# Patient Record
Sex: Female | Born: 1940 | Race: Black or African American | Hispanic: No | Marital: Single | Smoking: Never smoker
Health system: Southern US, Community
[De-identification: ages and names within clinical notes are randomized; demographics above are authoritative.]

## PROBLEM LIST (undated history)

## (undated) DIAGNOSIS — F419 Anxiety disorder, unspecified: Secondary | ICD-10-CM

## (undated) DIAGNOSIS — I10 Essential (primary) hypertension: Secondary | ICD-10-CM

## (undated) DIAGNOSIS — F329 Major depressive disorder, single episode, unspecified: Secondary | ICD-10-CM

## (undated) DIAGNOSIS — M359 Systemic involvement of connective tissue, unspecified: Secondary | ICD-10-CM

## (undated) DIAGNOSIS — F32A Depression, unspecified: Secondary | ICD-10-CM

## (undated) DIAGNOSIS — M810 Age-related osteoporosis without current pathological fracture: Secondary | ICD-10-CM

## (undated) DIAGNOSIS — C801 Malignant (primary) neoplasm, unspecified: Secondary | ICD-10-CM

## (undated) DIAGNOSIS — K219 Gastro-esophageal reflux disease without esophagitis: Secondary | ICD-10-CM

## (undated) DIAGNOSIS — I209 Angina pectoris, unspecified: Secondary | ICD-10-CM

## (undated) DIAGNOSIS — M1990 Unspecified osteoarthritis, unspecified site: Secondary | ICD-10-CM

## (undated) DIAGNOSIS — A6000 Herpesviral infection of urogenital system, unspecified: Secondary | ICD-10-CM

## (undated) HISTORY — PX: JOINT REPLACEMENT: SHX530

## (undated) HISTORY — PX: ABDOMINAL HYSTERECTOMY: SHX81

## (undated) HISTORY — PX: COLONOSCOPY: SHX174

## (undated) HISTORY — PX: OOPHORECTOMY: SHX86

## (undated) HISTORY — PX: TUBAL LIGATION: SHX77

---

## 2006-08-23 ENCOUNTER — Emergency Department: Payer: Self-pay | Admitting: Emergency Medicine

## 2006-08-23 ENCOUNTER — Other Ambulatory Visit: Payer: Self-pay

## 2006-09-25 ENCOUNTER — Emergency Department: Payer: Self-pay | Admitting: Emergency Medicine

## 2007-05-16 ENCOUNTER — Emergency Department: Payer: Self-pay | Admitting: Emergency Medicine

## 2007-08-19 ENCOUNTER — Emergency Department: Payer: Self-pay

## 2007-08-19 ENCOUNTER — Other Ambulatory Visit: Payer: Self-pay

## 2009-01-01 ENCOUNTER — Emergency Department: Payer: Self-pay | Admitting: Emergency Medicine

## 2009-01-28 ENCOUNTER — Ambulatory Visit: Payer: Self-pay | Admitting: Internal Medicine

## 2009-02-27 ENCOUNTER — Ambulatory Visit: Payer: Self-pay | Admitting: Internal Medicine

## 2009-04-10 ENCOUNTER — Ambulatory Visit: Payer: Self-pay | Admitting: Orthopedic Surgery

## 2009-04-17 ENCOUNTER — Emergency Department: Payer: Self-pay | Admitting: Emergency Medicine

## 2009-07-17 ENCOUNTER — Ambulatory Visit: Payer: Self-pay | Admitting: Rheumatology

## 2009-08-06 ENCOUNTER — Ambulatory Visit: Payer: Self-pay | Admitting: Rheumatology

## 2010-02-08 ENCOUNTER — Emergency Department: Payer: Self-pay | Admitting: Emergency Medicine

## 2010-10-05 ENCOUNTER — Emergency Department: Payer: Self-pay | Admitting: Unknown Physician Specialty

## 2010-10-09 ENCOUNTER — Ambulatory Visit: Payer: Self-pay | Admitting: Unknown Physician Specialty

## 2010-11-25 ENCOUNTER — Ambulatory Visit: Payer: Self-pay | Admitting: Unknown Physician Specialty

## 2010-12-12 ENCOUNTER — Emergency Department: Payer: Self-pay | Admitting: Emergency Medicine

## 2011-01-06 ENCOUNTER — Ambulatory Visit: Payer: Self-pay | Admitting: Unknown Physician Specialty

## 2011-03-29 ENCOUNTER — Emergency Department: Payer: Self-pay | Admitting: Emergency Medicine

## 2011-11-24 ENCOUNTER — Emergency Department: Payer: Self-pay | Admitting: Unknown Physician Specialty

## 2012-02-19 ENCOUNTER — Ambulatory Visit: Payer: Self-pay | Admitting: Obstetrics and Gynecology

## 2012-02-19 LAB — URINALYSIS, COMPLETE
Bacteria: NONE SEEN
Bilirubin,UR: NEGATIVE
Blood: NEGATIVE
Glucose,UR: NEGATIVE mg/dL (ref 0–75)
Ph: 6 (ref 4.5–8.0)
Specific Gravity: 1.009 (ref 1.003–1.030)
Squamous Epithelial: NONE SEEN

## 2012-02-19 LAB — CBC
MCHC: 33.3 g/dL (ref 32.0–36.0)
RDW: 13.1 % (ref 11.5–14.5)
WBC: 6.2 10*3/uL (ref 3.6–11.0)

## 2012-02-25 ENCOUNTER — Ambulatory Visit: Payer: Self-pay | Admitting: Obstetrics and Gynecology

## 2012-02-26 LAB — HEMATOCRIT: HCT: 38.6 % (ref 35.0–47.0)

## 2012-03-02 LAB — PATHOLOGY REPORT

## 2012-06-19 ENCOUNTER — Emergency Department: Payer: Self-pay | Admitting: Emergency Medicine

## 2013-11-15 ENCOUNTER — Ambulatory Visit: Payer: Self-pay | Admitting: General Practice

## 2013-11-15 LAB — URINALYSIS, COMPLETE
Bacteria: NONE SEEN
Bilirubin,UR: NEGATIVE
Blood: NEGATIVE
Leukocyte Esterase: NEGATIVE
Protein: 30
RBC,UR: <1 /HPF (ref 0–5)
Specific Gravity: 1.017 (ref 1.003–1.030)
WBC UR: 1 /HPF (ref 0–5)

## 2013-11-15 LAB — CBC
HCT: 40.7 % (ref 35.0–47.0)
HGB: 13.7 g/dL (ref 12.0–16.0)
MCH: 32.3 pg (ref 26.0–34.0)
MCHC: 33.5 g/dL (ref 32.0–36.0)
Platelet: 144 10*3/uL — ABNORMAL LOW (ref 150–440)
WBC: 5.1 10*3/uL (ref 3.6–11.0)

## 2013-11-15 LAB — BASIC METABOLIC PANEL
BUN: 19 mg/dL — ABNORMAL HIGH (ref 7–18)
Creatinine: 0.9 mg/dL (ref 0.60–1.30)
EGFR (African American): >60
EGFR (Non-African Amer.): >60
Glucose: 93 mg/dL (ref 65–99)
Osmolality: 279 (ref 275–301)
Sodium: 139 mmol/L (ref 136–145)

## 2013-11-15 LAB — APTT: Activated PTT: 34 secs (ref 23.6–35.9)

## 2013-11-15 LAB — PROTIME-INR
INR: 0.9
Prothrombin Time: 12.2 secs (ref 11.5–14.7)

## 2013-11-16 LAB — URINE CULTURE

## 2013-11-27 ENCOUNTER — Inpatient Hospital Stay: Payer: Self-pay | Admitting: General Practice

## 2013-11-28 LAB — HEMOGLOBIN: HGB: 12.1 g/dL (ref 12.0–16.0)

## 2013-11-28 LAB — BASIC METABOLIC PANEL
Anion Gap: 4 — ABNORMAL LOW (ref 7–16)
BUN: 12 mg/dL (ref 7–18)
Calcium, Total: 8.6 mg/dL (ref 8.5–10.1)
Chloride: 104 mmol/L (ref 98–107)
EGFR (African American): >60
EGFR (Non-African Amer.): 58 — ABNORMAL LOW
Potassium: 3.5 mmol/L (ref 3.5–5.1)
Sodium: 136 mmol/L (ref 136–145)

## 2013-11-29 LAB — BASIC METABOLIC PANEL
Anion Gap: 3 — ABNORMAL LOW (ref 7–16)
BUN: 12 mg/dL (ref 7–18)
Calcium, Total: 8.6 mg/dL (ref 8.5–10.1)
Chloride: 106 mmol/L (ref 98–107)
EGFR (Non-African Amer.): 60 — ABNORMAL LOW
Glucose: 93 mg/dL (ref 65–99)
Osmolality: 273 (ref 275–301)
Potassium: 3.8 mmol/L (ref 3.5–5.1)
Sodium: 137 mmol/L (ref 136–145)

## 2014-01-09 ENCOUNTER — Ambulatory Visit: Payer: Self-pay | Admitting: General Practice

## 2014-01-09 LAB — URINALYSIS, COMPLETE
BACTERIA: NONE SEEN
BILIRUBIN, UR: NEGATIVE
Glucose,UR: NEGATIVE mg/dL (ref 0–75)
Ketone: NEGATIVE
Leukocyte Esterase: NEGATIVE
Nitrite: NEGATIVE
Ph: 5 (ref 4.5–8.0)
RBC,UR: <1 /HPF (ref 0–5)
SPECIFIC GRAVITY: 1.015 (ref 1.003–1.030)
Squamous Epithelial: NONE SEEN
WBC UR: <1 /HPF (ref 0–5)

## 2014-01-09 LAB — BASIC METABOLIC PANEL
Anion Gap: 4 — ABNORMAL LOW (ref 7–16)
BUN: 19 mg/dL — ABNORMAL HIGH (ref 7–18)
CALCIUM: 9.4 mg/dL (ref 8.5–10.1)
CHLORIDE: 105 mmol/L (ref 98–107)
CO2: 29 mmol/L (ref 21–32)
CREATININE: 1.02 mg/dL (ref 0.60–1.30)
EGFR (African American): >60
EGFR (Non-African Amer.): 55 — ABNORMAL LOW
GLUCOSE: 99 mg/dL (ref 65–99)
OSMOLALITY: 278 (ref 275–301)
Potassium: 4 mmol/L (ref 3.5–5.1)
SODIUM: 138 mmol/L (ref 136–145)

## 2014-01-09 LAB — CBC
HCT: 40.7 % (ref 35.0–47.0)
HGB: 13.5 g/dL (ref 12.0–16.0)
MCH: 32.8 pg (ref 26.0–34.0)
MCHC: 33.2 g/dL (ref 32.0–36.0)
MCV: 99 fL (ref 80–100)
Platelet: 171 10*3/uL (ref 150–440)
RBC: 4.12 10*6/uL (ref 3.80–5.20)
RDW: 13.2 % (ref 11.5–14.5)
WBC: 5.1 10*3/uL (ref 3.6–11.0)

## 2014-01-09 LAB — PROTIME-INR
INR: 0.8
PROTHROMBIN TIME: 11.4 s — AB (ref 11.5–14.7)

## 2014-01-09 LAB — MRSA PCR SCREENING

## 2014-01-09 LAB — APTT: Activated PTT: 32.1 secs (ref 23.6–35.9)

## 2014-01-10 LAB — URINE CULTURE

## 2014-01-24 ENCOUNTER — Inpatient Hospital Stay: Payer: Self-pay | Admitting: General Practice

## 2014-01-24 LAB — SEDIMENTATION RATE: Erythrocyte Sed Rate: 6 mm/hr (ref 0–30)

## 2014-01-25 LAB — BASIC METABOLIC PANEL
Anion Gap: 3 — ABNORMAL LOW (ref 7–16)
BUN: 16 mg/dL (ref 7–18)
Calcium, Total: 8.8 mg/dL (ref 8.5–10.1)
Chloride: 107 mmol/L (ref 98–107)
Co2: 27 mmol/L (ref 21–32)
Creatinine: 1.21 mg/dL (ref 0.60–1.30)
EGFR (African American): 52 — ABNORMAL LOW
GFR CALC NON AF AMER: 45 — AB
Glucose: 99 mg/dL (ref 65–99)
Osmolality: 275 (ref 275–301)
Potassium: 3.6 mmol/L (ref 3.5–5.1)
SODIUM: 137 mmol/L (ref 136–145)

## 2014-01-25 LAB — PLATELET COUNT: Platelet: 105 10*3/uL — ABNORMAL LOW (ref 150–440)

## 2014-01-25 LAB — HEMOGLOBIN: HGB: 11.5 g/dL — ABNORMAL LOW (ref 12.0–16.0)

## 2015-03-29 NOTE — Op Note (Signed)
PATIENT NAME:  Sherry Owens, Sherry Owens MR#:  [id number] DATE OF BIRTH:  Feb 22, 1941  DATE OF PROCEDURE:  11/27/2013  PREOPERATIVE DIAGNOSIS: Degenerative arthrosis of the left knee.   POSTOPERATIVE DIAGNOSIS: Degenerative arthrosis of the left knee.   PROCEDURE PERFORMED: Left total knee arthroplasty using computer-assisted navigation.   SURGEON: Laurice Record. Marry Guan, M.D.   ASSISTANT: April Berndt, NP (required to maintain retraction throughout the procedure).   ANESTHESIA: Spinal.   ESTIMATED BLOOD LOSS: 100 mL.   FLUIDS REPLACED: 1600 mL of crystalloid.   TOURNIQUET TIME: 99 minutes.   DRAINS: Two medium drains to reinfusion system.   SOFT TISSUE RELEASES: Anterior cruciate ligament, posterior cruciate ligament, deep medial collateral ligament, and patellofemoral ligament.   IMPLANTS UTILIZED: DePuy PFC Sigma size 3 posterior stabilized femoral component (cemented), size 2.5 MBT tibial component (cemented), 35 mm three-peg oval dome patella (cemented), and a 10 mm stabilized rotating platform polyethylene insert.   INDICATIONS FOR SURGERY: The patient is a 74 year old female who has been seen for complaints of progressive left knee pain. X-rays demonstrated severe degenerative changes  of the relative varus deformity. After discussion of the risks and benefits of surgical intervention, the patient expressed understanding of the risks, benefits, and agreed with plans for surgical intervention.   PROCEDURE IN DETAIL: The patient was brought to the operating room and, after adequate spinal anesthesia was achieved, a tourniquet was placed on the patient's upper left thigh. The patient's left knee and leg were cleaned and prepped with alcohol and DuraPrep and draped in the usual sterile fashion. A "timeout" was performed as per usual protocol. The left lower extremity was exsanguinated using an Esmarch, and the tourniquet was inflated to 300 mmHg.   An anterior longitudinal incision was made followed  by a standard mid vastus approach. A large effusion was evacuated. The deep fibers of the medial collateral ligament were elevated in a subperiosteal fashion off the medial flare of the tibia so as to maintain a continuous soft-tissue sleeve. The patella was subluxed laterally and the patellofemoral ligament was incised. Inspection of the knee demonstrated severe degenerative changes in a tricompartmental fashion with notable changes to the patellofemoral articulation. Prominent osteophytes were debrided using a rongeur. Anterior and posterior cruciate ligaments were excised.   Two 4.0 mm Schanz pins were inserted into the femur and into the tibia for attachment of the array of trackers used for computer-assisted navigation. Hip center was identified using circumduction technique. Distal landmarks were mapped using the computer. The distal femur and proximal tibia were mapped using the computer. Distal femoral cutting guide was positioned using computer-assisted navigation so as to achieve 5-degree distal valgus cut. Cut was performed and verified using the computer.   Distal femur was sized and it was felt that a size 3 femoral component was appropriate. A size 3 cutting guide was positioned and anterior cut was performed and verified using the computer. This was followed by completion of the posterior and chamfer cuts. Femoral cutting guide for central box was then positioned and the central box cut was performed.  Attention was then directed to the proximal tibia. Medial and lateral menisci were excised. The extramedullary tibial cutting guide was positioned using computer-assisted navigation so as to achieve 0-degree varus-valgus alignment and 0-degree posterior slope. Cut was performed and verified using the computer. The proximal tibia was sized and it was felt that a size 2.5 tibial tray was appropriate. Tibial and femoral trials were inserted. Posterior osteophytes were debrided off of the femoral  condyle using curved osteotomes. A 10 mm polyethylene trial was inserted and the knee was placed through a range of motion. Excellent mediolateral soft-tissue balancing was appreciated in full extension and in flexion.   Finally, the patella was cut and prepared so as to accommodate a 35 mm three-peg oval dome patella. Patellar trial was placed and he was placed through range of motion with excellent patellar tracking appreciated. The femoral trial was removed. Central post hole for the tibial component was reamed followed by insertion of a keel punch. Tibial trials were then removed.   The cut surfaces of bone were irrigated with copious amounts of normal saline with antibiotic solution using pulsatile lavage and then suctioned dry. Polymethyl methacrylate cement was prepared in the usual fashion using a vacuum mixer. Cement was applied to the cut surface of the proximal tibia as well as along the undersurface of a size 2.5 MBT tibial component. The tibial component was positioned and impacted into place. Excess cement was removed using [employer] fast streamer. Cement was then applied to the cut surfaces of the femur as well as on the posterior flanges of  size 3 posterior stabilized femoral component. Femoral component was positioned and impacted into place. Excess cement was removed using [employer] fast streamer. A 10 mm stabilized polyethylene trial was inserted and the knee was brought in full extension with steady axial compression applied. Finally, cement was applied to the backside of a 35 mm three-peg oval dome patella, and the patellar component was positioned and patellar clamp applied. Excess cement was removed using [employer] fast streamer.   After adequate curing of the cement, tourniquet was deflated after a total tourniquet time of 99 minutes. Hemostasis was achieved using electrocautery. The knee was irrigated with copious amounts of normal saline with antibiotic solution using pulsatile lavage and then suctioned  dry. The knee was inspected for any residual cement debris. Then 20 mL of 1.3% Exparel in 40 mL of normal saline was injected along the posterior capsule, medial and lateral gutters, and along the arthrotomy site. A 10 mm stabilized rotating platform polyethylene insert was inserted and the knee was placed through a range of motion. Excellent patellar tracking was appreciated and excellent mediolateral soft-tissue balancing was appreciated both in flexion and in extension.   Two medium drains were placed in the wound bed and brought out through a separate stab incision to be attached to a reinfusion system. The medial parapatellar portion of the incision was reapproximated using interrupted sutures of #1 Vicryl. The subcutaneous tissue was injected with a total of 30 mL of 0.25% Marcaine with epinephrine. The subcutaneous tissue was approximated in layers using first #0 Vicryl followed by 2-0 Vicryl. The skin was closed with skin staples. A sterile dressing was applied. The patient tolerated the procedure well. She was transported to the recovery room in stable condition.    ____________________________ Laurice Record. Holley Bouche., MD jph:np D: 11/27/2013 15:51:31 ET T: 11/27/2013 22:31:55 ET JOB#: [id number]  cc: Jeneen Rinks P. Holley Bouche., MD, <Dictator> JAMES P Holley Bouche MD ELECTRONICALLY SIGNED 11/29/2013 7:26

## 2015-03-30 NOTE — Discharge Summary (Signed)
PATIENT NAME:  Sherry Owens, Sherry Owens MR#:  [id number] DATE OF BIRTH:  January 23, 1941  DATE OF ADMISSION:  11/27/2013 DATE OF DISCHARGE:  11/30/2013  ADMITTING DIAGNOSIS: Degenerative arthrosis of the left knee.   DISCHARGE DIAGNOSIS: Degenerative arthrosis of the left knee.  OPERATION: On 11/27/2013, the patient had a left total knee arthroplasty using computer-aided navigation.   SURGEON: Skip Estimable, M.D.   ASSISTANT: Francena Hanly, NP.   ANESTHESIA: Spinal.   ESTIMATED BLOOD LOSS: 100 mL.  TOURNIQUET TIME: 99 minutes.   DRAINS: Two medium drains to reinfusion system.   IMPLANTS USED: DePuy PFC Sigma size 3 posterior stabilized femoral component that was cemented, size 2.5. MBT tibial component (cemented), 35 mm 3-peg oval dome patella that was cemented and a 10 mm stabilized rotating platform polyethylene insert. The patient was stabilized after surgery, brought to the recovery room, and then brought down to the orthopedic floor for pain control and physical therapy.   HISTORY: The patient is a 74 year old female, who presented for upcoming left total knee replacement. The patient has been refractory to conservative treatment. The patient has had activities of daily living that have been more difficulty. The patient cannot tolerate anti-inflammatories and has had cortisone injections with no relief. The patient was set up for surgery.   PHYSICAL EXAMINATION:  GENERAL: Alert female with antalgic gait and a varus thrust to the left knee.  LUNGS: Clear to auscultation.  CARDIOVASCULAR: Regular rate and rhythm.  MUSCULOSKELETAL: In regard to the left knee, the patient has no effusion. The patient has medial joint line tenderness. The patient has full extension to 128 degrees of flexion.   HOSPITAL COURSE: After initial admission on November 27, 2013, the patient was brought to the orthopedic floor. On postoperative day 1, the patient had a hemoglobin of 12.1, which dropped down to around 10.9. On  postoperative day 2, the patient did physical therapy, ambulating 125 feet on postop day 1 and did very well. The patient was ready to go home on November 30, 2013.   CONDITION AT DISCHARGE: Stable.   DISPOSITION: The patient was sent home with home health physical therapy.   DISCHARGE INSTRUCTIONS: The patient will follow up with [facility] orthopedics on December 12, 2013. The patient will do weight bear as tolerated. The patient will elevate her leg with 1 to 2 pillows and use thigh-high TED hose on both legs, removed at bedtime. The patient will elevate her heels off the bed and be encouraged to do cough and deep breathing. Diet is regular. The patient will use Polar Care to decrease swelling and try to keep the dressing clean and dry, try not to get it wet. The patient will have a dressing change as needed with physical therapy. The patient will call the clinic if there is any bright red bleeding, calf pain, bowel or bladder difficulty, or fever greater than 101.5. The patient will do home health physical therapy per protocol.   DISCHARGE MEDICATIONS: The patient will use her home medication and then she will also take oxycodone 5 mg 1 tablet q.4 hours as needed for pain, Tramadol 50 mg 1 tablet every 4 hours as needed for less severe pain, Celebrex 1 capsule b.i.d. for 30 days and Lovenox 40 mg subcutaneous once a day for 14 days, then discontinue and begin an enteric-coated aspirin 81 mg once a day.  ____________________________ Lenna Sciara. Reche Dixon, [place] jtm:aw D: 11/29/2013 06:42:00 ET T: 11/29/2013 06:51:50 ET JOB#: [id number]  cc: J. Reche Dixon, [place], <Dictator> J  Lillyanne Bradburn PA ELECTRONICALLY SIGNED 12/09/2013 6:23

## 2015-03-30 NOTE — Op Note (Signed)
PATIENT NAME:  Sherry Owens, Sherry Owens MR#:  [id number] DATE OF BIRTH:  [birth year]  DATE OF PROCEDURE:  01/24/2014  PREOPERATIVE DIAGNOSIS: Degenerative arthrosis of the right knee.   POSTOPERATIVE DIAGNOSIS: Degenerative arthrosis of the right knee.   PROCEDURE PERFORMED: Right total knee arthroplasty using computer-assisted navigation.   SURGEON: Laurice Record. Holley Bouche., MD  ASSISTANT: Vance Peper, PA (required to maintain retraction throughout the procedure).   ANESTHESIA: Spinal.   ESTIMATED BLOOD LOSS: 100 mL.   FLUIDS REPLACED: 2000 mL of crystalloid.   TOURNIQUET TIME: 70 minutes.   DRAINS: Two medium drains to reinfusion system.   SOFT TISSUE RELEASES: Anterior cruciate ligament, posterior cruciate ligament, deep medial collateral ligament and patellofemoral ligament.   IMPLANTS UTILIZED: DePuy PFC Sigma size 3 posterior stabilized femoral component (cemented), size 2.5 MBT tibial component (cemented), 35 mm 3 peg oval dome patella (cemented), and a 10 mm stabilized rotating platform polyethylene insert.   INDICATIONS FOR SURGERY: The patient is a 74 year old female who has been seen for complaints of progressive right knee pain. X-rays demonstrated significant degenerative changes in a tricompartmental fashion. The patient previously had undergone left total knee arthroplasty approximately 2 months ago with excellent results. Risks and benefits of surgical intervention were discussed with the patient. The patient expressed understanding of the risks and benefits and agreed with plans for surgical intervention.   PROCEDURE IN DETAIL: The patient was brought into the operating room and, after adequate spinal anesthesia was achieved, a tourniquet was placed on the patient's upper right thigh. The patient's right knee and leg were cleaned and prepped with alcohol and DuraPrep and draped in the usual sterile fashion. A "timeout" was performed as per usual protocol. The right lower extremity was  exsanguinated using an Esmarch, and tourniquet was inflated to 300 mmHg. An anterior longitudinal incision was made, followed by a standard mid vastus approach. A moderate effusion was evacuated. The deep fibers of the medial collateral ligament were elevated in a subperiosteal fashion off the medial flare of the tibia so as to maintain a continuous soft tissue sleeve. The patella was subluxed laterally, and the patellofemoral ligament was incised. Inspection of the knee demonstrated severe degenerative changes most notably to the medial and patellofemoral compartments. Osteophytes were debrided using a rongeur. Anterior and posterior cruciate ligaments were excised. Two 4.0 mm Schanz pins were inserted into the femur and into the tibia for attachment of the array of trackers used for computer-assisted navigation. Hip center was identified using circumduction technique. Distal landmarks were mapped using the computer. The distal femur and proximal tibia were mapped using the computer. The distal femoral cutting guide was positioned using computer-assisted navigation so as to achieve a 5 degree distal valgus cut. Cut was performed and verified using the computer. Distal femur was sized, and it was felt that a size 3 femoral component was appropriate. A size 3 cutting guide was positioned, and the anterior cut was performed and verified using the computer. This was followed by completion of the posterior and chamfer cuts. Femoral cutting guide for a central box was then positioned, and the central box cut was performed. Attention was then directed to the proximal tibia. Medial and lateral menisci were excised. The extramedullary tibial cutting guide was positioned using computer-assisted navigation so as to achieve a 0 degree varus and valgus alignment and a 0 degree posterior slope. Cut was performed and verified using the computer. The proximal tibia was sized, and it was felt that a size 2.5 tibial tray  was  appropriate. Tibial and femoral trials were inserted, followed by insertion of a 10 mm polyethylene insert. Excellent medial and lateral soft tissue balance was appreciated both in full extension and in flexion. Finally, the patella was cut and prepared so as to accommodate a 35 mm 3 peg oval dome patella. Patellar trial was placed, and the knee was placed through range of motion, with excellent patellar tracking appreciated.   Femoral trial was removed. Central post hole for the tibial component was reamed, followed by insertion of a keel punch. Tibial trials were then removed. The cut surfaces of bone were irrigated with copious amounts of normal saline with antibiotic solution using pulsatile lavage and then suctioned dry. Polymethyl methacrylate cement was prepared in the usual fashion using a vacuum mixer. Cement was applied to the cut surface of the proximal tibia as well as along the undersurface of a size 2.5 MBT tibial component. Tibial component was positioned and impacted into place. Excess cement was removed using [employer] fast streamer. Cement was then applied to the cut surface of the femur as well as along the posterior flanges of a size 3 posterior stabilized femoral component. Femoral component was positioned and impacted into place. Excess cement was removed using [employer] fast streamer. A 10 mm polyethylene trial was inserted, and the knee was brought into full extension with steady axial compression applied. Finally, cement was applied to the backside of a 35 mm 3 peg oval dome patella, and the patellar component was positioned and patellar clamp applied. Excess cement was removed using [employer] fast streamer.   After adequate curing of cement, tourniquet was deflated after a total tourniquet time of 70 minutes. Hemostasis was achieved using electrocautery. The knee was irrigated with copious amounts of normal saline with antibiotic solution using pulsatile lavage and then suctioned dry. The knee was inspected  for any residual cement debris. Then, 20 mL of 1.3% Exparel in 40 mL of normal saline was injected along the posterior capsule, medial and lateral gutters and along the arthrotomy site. A 10 mm stabilized rotating platform polyethylene insert was inserted, and the knee was placed through range of motion with excellent patellar tracking appreciated and excellent medial and lateral soft tissue balancing noted both in full extension and in flexion. Two medium drains were placed in the wound bed and brought out through a separate stab incision to be attached to a reinfusion system. The medial parapatellar portion of the incision was reapproximated using interrupted sutures of #1 Vicryl. The subcutaneous tissue was approximated in layers using first #0 Vicryl, followed by 2-0 Vicryl. Skin was closed with skin staples after injecting of 30 mL of 0.25% Marcaine with epinephrine in the subcutaneous tissue along the incision line. A sterile dressing was applied.   The patient tolerated the procedure well. She was transported to the recovery room in stable condition.   ____________________________ Laurice Record. Holley Bouche., MD jph:lb D: 01/24/2014 10:21:38 ET T: 01/24/2014 11:16:53 ET JOB#: [id number]  cc: Jeneen Rinks P. Holley Bouche., MD, <Dictator> Laurice Record Holley Bouche MD ELECTRONICALLY SIGNED 01/26/2014 6:44

## 2015-03-30 NOTE — Discharge Summary (Signed)
PATIENT NAME:  Sherry Owens, Sherry Owens MR#:  [id number] DATE OF BIRTH:  [birth year]  DATE OF ADMISSION:  01/24/2014 DATE OF DISCHARGE:  01/27/2014  DICTATING FOR: Skip Estimable, MD.  ADMITTING DIAGNOSIS: Degenerative arthrosis of the right knee.   DISCHARGE DIAGNOSIS: Degenerative arthrosis of the right knee.   HISTORY OF PRESENT ILLNESS:  The patient is a pleasant 74 year old female who has been followed at [facility] for progression of bilateral knees. She had undergone a left total knee arthroplasty approximately 6 weeks prior to this admission for right knee pain. She had localized most of the pain along the medial aspect of the knee. Her pain was aggravated with weight-bearing activities. She had denied any gross locking or giving way of the right knee. The right knee pain had begun to increase her limitation with her activities of daily living.   She states that the pain has increased to the point where it is significantly interfering with her activities of daily living.   X-rays of the right knee taken in [facility] showed narrowing of the medial cartilage space with associated varus alignment. She was noted to have osteophyte as well as subchondral sclerosis. After discussion of the risks and benefits of surgical intervention, the patient expressed her understanding of the risks and benefits and agreed for plans for surgical intervention.   HOSPITAL COURSE:   PROCEDURE: Right total knee arthroplasty using computer-assisted navigation.   ANESTHESIA: Spinal.   SOFT TISSUE RELEASE: Anterior cruciate ligament, posterior cruciate ligament, deep medial collateral ligament, as well as the patellofemoral ligament.   IMPLANTS UTILIZED: DePuy PFC Sigma size 3 posterior stabilized femoral component (cemented), size 2.5 MBT tibial component (cemented), 35 mm three-pegged oval dome patella (cemented), and a 10 mm stabilized rotating platform polyethylene insert.   HOSPITAL COURSE: The patient  tolerated the procedure very well. She had no complications. She was then taken to the PACU where she was stabilized and then transferred to the orthopedic floor. The patient began receiving anticoagulation therapy of Lovenox 30 mg subcu q.12 hours per anesthesia pharmacy protocol. She was fitted with TED stockings bilaterally. These were  allowed to be removed 1 hour per 8-hour shift. The right leg stocking was applied on day 2 following removal of the Hemovac and dressing change.   The patient was also fitted with A-VI compression foot pumps bilaterally set at 80 mmHg. Her calves have been nontender. There has been no evidence of any DVTs. Negative Homans sign. Heels were elevated off the bed using rolled towels.   The patient has denied any chest pain or shortness of breath. Vital signs have been stable. She has afebrile. Hemodynamically she is stable. No transfusions were given other than the Autovac transfusions given the first 6 hours postoperatively.   Physical therapy was initiated on day 1 for gait training and transfers. She was initially extremely slow about ambulating. She was noted to have excellent range of motion. She just liked to stay in bed where it was warm. After discussion of possibly going to rehab, she did begin ambulating much better and upon being discharged was ambulating greater than 200 feet. She was able go up 4 steps. Was independent with bed-to-chair transfers. The patient also began receiving occupational therapy on day 1 for ADLs and assistive devices.   The patient's IV, Foley and Hemovac were discontinued on day 2 along with a dressing change. The wound free of any drainage or signs of infection. Polar Care was reapplied to the surgical leg, maintaining a  temperature of 40 to 50 degrees Fahrenheit.   DISPOSITION: The patient was discharged to home in improved stable condition.   DISCHARGE INSTRUCTIONS:  1.  She may weight bear as tolerated. Continue using a walker  until cleared by physical therapy to go to a quad cane. She will receive home health PT. She is to continue with TED stockings bilaterally. These may be removed at night but are to be worn during the day. Also continue with Polar Care, maintaining a temperature of 40 to 50 degrees Fahrenheit. Elevate the right lower extremity on pillows.  2.  Recommend that she continue with incentive spirometer q.1 hour while awake. Encourage cough and deep breathing q.2 hours while awake.  3.  She is placed on a regular diet.  4.  She was instructed on wound care. She is not to get the wound wet until the staples are removed in 2 weeks.  5.  She has a follow-up appointment with [facility] on March 5 at 8:45. She is to call the clinic sooner if any temperatures of 101.5 or greater or excessive bleeding.   DRUG ALLERGIES: CYMBALTA.   MEDICATIONS: The patient is to resume her regular medication that she was on prior to admission. She was given a prescription for oxycodone 5 to 10 mg q.4-6 hours p.r.n. for pain, Ultram 50 to 100 mg q.4-6 hours p.r.n. for pain, Lovenox 40 mg subcu daily for 14 days then discontinue and begin taking one 81 mg enteric-coated aspirin unless contraindicated.   PAST MEDICAL HISTORY:  1.  Cataracts.  2.  Depression.  3.  Coronary artery disease.  4.  Angina.  5.  Congestive heart failure.  6.  Gastroesophageal reflux disease with gastritis.  7.  Hyperlipidemia.  8.  Migraine headaches.  9.  Vitamin D deficiency.  10.  Hypertension.  11.  Hyperplastic colon polyps.   ____________________________ Vance Peper, PA [id number] D: 01/29/2014 18:10:53 ET T: 01/29/2014 22:39:35 ET JOB#: [id number]  cc: Vance Peper, PA, <Dictator> JON WOLFE PA ELECTRONICALLY SIGNED 01/30/2014 8:01

## 2015-03-31 NOTE — Op Note (Signed)
PATIENT NAME:  Sherry Owens, Sherry Owens MR#:  [id number] DATE OF BIRTH:  February 13, 1941  DATE OF PROCEDURE:  02/25/2012  PREOPERATIVE DIAGNOSIS:  Ovarian cyst and postmenopausal bleeding.   POSTOPERATIVE DIAGNOSIS: Ovarian cyst and postmenopausal bleeding.   PROCEDURE:  Complete laparoscopic hysterectomy, bilateral salpingo-oophorectomy.   SURGEON: Donzetta Matters, M.D.   ASSISTANT: Malachy Mood, M.D.   ESTIMATED BLOOD LOSS: 50 mL. OPERATIVE FLUIDS: 1600 mL.  COMPLICATIONS: None.  FINDINGS: Soft, boggy uterus. Normal-appearing tubes and left ovary. Cyst on the right ovary. SPECIMEN: Uterus with cervix, right and left tubes and ovaries.   INDICATIONS: The patient is a 74 year old who presents with a long-standing history of right ovarian cyst. The cyst had not resolved over the last year. She was also evaluated for postmenopausal bleeding. The patient desired definitive surgical management. Risks, benefits, indications, and alternatives of the procedure were explained and informed consent was obtained.   PROCEDURE: The patient was taken to the Operating Room with IV fluids running. She was prepped and draped in the usual sterile fashion in [place]. A speculum was placed in the vagina. The anterior lip of the cervix was grasped with a single-tooth tenaculum. The uterus sounded. A V-Care uterine manipulator was placed. Attention was turned to her abdomen where the infraumbilical region was injected with 0.5% Sensorcaine. A Veress needle was placed. The abdomen was insufflated with CO2 gas. The Veress needle was removed and the abdomen was entered under direct visualization using a #11 mm Xcel trocar. Two additional trocars were placed, an 11 mm in the patient's left and a 5 mm on the patient's right. The left uterine cornu was grasped with an atraumatic grasper. The right infundibulopelvic ligament was cauterized with Kleppinger and transected using the harmonic scalpel. The round ligament was  cauterized and transected using the harmonic scalpel. The bladder flap was created. The remainder of the broad ligament and the cardinal ligament containing the uterine artery were also cauterized with Kleppinger and transected using the harmonic scalpel. This was repeated on the patient's right side. The uterus was amputated as the colpotomy was made. The uterus and both tubes and ovaries were removed through the patient's vagina. The right tube and ovary containing the ovarian cyst were amputated at the utero-ovarian ligament and sent to pathology for frozen section. 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 A lubricant embedded sponge was placed inside of the vagina to maintain pneumoperitoneum. The vaginal cuff was closed using EndoStitch closure device. All areas were examined and found to be securely closed. All surgical areas were examined and were found to be hemostatic. The procedure was ended at this point. All trocars were removed from the patient's abdomen. The fascia of the umbilical incision was closed with #0 Vicryl and the skin of all incisions were closed with 4-0 Vicryl. The patient tolerated the procedure well. Sponge, needle and instrument counts were correct x2. The patient was awakened from anesthesia and taken to the recovery room in stable condition.   ____________________________ Rolm Gala Ferne Reus, MD law:ap D: 02/25/2012 11:03:37 ET T: 02/25/2012 11:28:57 ET JOB#: [id number]  cc: Sherlynn Carbon A. Ferne Reus, MD, <Dictator> Renda Rolls LEE MD ELECTRONICALLY SIGNED 02/25/2012 11:57

## 2016-02-24 ENCOUNTER — Emergency Department
Admission: EM | Admit: 2016-02-24 | Discharge: 2016-02-25 | Disposition: A | Discharge status: Home / Self Care | Payer: Medicare Other | Attending: Emergency Medicine | Admitting: Emergency Medicine

## 2016-02-24 ENCOUNTER — Encounter: Payer: Self-pay | Admitting: *Deleted

## 2016-02-24 DIAGNOSIS — M62830 Muscle spasm of back: Secondary | ICD-10-CM | POA: Diagnosis not present

## 2016-02-24 DIAGNOSIS — I10 Essential (primary) hypertension: Secondary | ICD-10-CM | POA: Insufficient documentation

## 2016-02-24 DIAGNOSIS — M545 Low back pain, unspecified: Secondary | ICD-10-CM

## 2016-02-24 DIAGNOSIS — R42 Dizziness and giddiness: Secondary | ICD-10-CM | POA: Diagnosis not present

## 2016-02-24 DIAGNOSIS — M62838 Other muscle spasm: Secondary | ICD-10-CM

## 2016-02-24 HISTORY — DX: Essential (primary) hypertension: I10

## 2016-02-24 HISTORY — DX: Anxiety disorder, unspecified: F41.9

## 2016-02-24 HISTORY — DX: Unspecified osteoarthritis, unspecified site: M19.90

## 2016-02-24 NOTE — ED Notes (Addendum)
Pt to ED via EMS with generalized muscle cramps and body pain. Pt states been working out in yard all day, began to have muscle cramps and joint pain. Pt states "i get locked up a lot like this" Pt with hx of severe anxiety. Upon arrival, pt states primarily back pain 10/10. Pt hypertensive at 218/92, states been out of BP meds x 3 days. Other vitals wnl at this time.

## 2016-02-25 ENCOUNTER — Encounter: Payer: Self-pay | Admitting: Emergency Medicine

## 2016-02-25 DIAGNOSIS — M545 Low back pain: Secondary | ICD-10-CM | POA: Diagnosis not present

## 2016-02-25 LAB — COMPREHENSIVE METABOLIC PANEL
ALBUMIN: 4.1 g/dL (ref 3.5–5.0)
ALK PHOS: 121 U/L (ref 38–126)
ALT: 19 U/L (ref 14–54)
ANION GAP: 5 (ref 5–15)
AST: 28 U/L (ref 15–41)
BILIRUBIN TOTAL: 0.8 mg/dL (ref 0.3–1.2)
BUN: 26 mg/dL — ABNORMAL HIGH (ref 6–20)
CALCIUM: 8.9 mg/dL (ref 8.9–10.3)
CO2: 25 mmol/L (ref 22–32)
CREATININE: 1.13 mg/dL — AB (ref 0.44–1.00)
Chloride: 102 mmol/L (ref 101–111)
GFR calc non Af Amer: 47 mL/min — ABNORMAL LOW (ref >60)
GFR, EST AFRICAN AMERICAN: 54 mL/min — AB (ref >60)
GLUCOSE: 94 mg/dL (ref 65–99)
Potassium: 3.2 mmol/L — ABNORMAL LOW (ref 3.5–5.1)
Sodium: 132 mmol/L — ABNORMAL LOW (ref 135–145)
TOTAL PROTEIN: 7.6 g/dL (ref 6.5–8.1)

## 2016-02-25 LAB — CBC
HEMATOCRIT: 41.4 % (ref 35.0–47.0)
HEMOGLOBIN: 13.8 g/dL (ref 12.0–16.0)
MCH: 31.8 pg (ref 26.0–34.0)
MCHC: 33.3 g/dL (ref 32.0–36.0)
MCV: 95.3 fL (ref 80.0–100.0)
Platelets: 148 10*3/uL — ABNORMAL LOW (ref 150–440)
RBC: 4.34 MIL/uL (ref 3.80–5.20)
RDW: 13 % (ref 11.5–14.5)
WBC: 5.6 10*3/uL (ref 3.6–11.0)

## 2016-02-25 LAB — TROPONIN I: Troponin I: <0.03 ng/mL (ref <0.031)

## 2016-02-25 MED ORDER — DIAZEPAM 2 MG PO TABS: 2.0000 mg | ORAL_TABLET | Freq: Three times a day (TID) | ORAL | Status: AC | PRN

## 2016-02-25 MED ORDER — DIAZEPAM 2 MG PO TABS
2.0000 mg | ORAL_TABLET | Freq: Once | ORAL | Status: AC
  Administered 2016-02-25: 2 mg via ORAL
  Filled 2016-02-25: qty 1

## 2016-02-25 NOTE — Discharge Instructions (Signed)
Dizziness °Dizziness is a common problem. It is a feeling of unsteadiness or light-headedness. You may feel like you are about to faint. Dizziness can lead to injury if you stumble or fall. Anyone can become dizzy, but dizziness is more common in older adults. This condition can be caused by a number of things, including medicines, dehydration, or illness. °HOME CARE INSTRUCTIONS °Taking these steps may help with your condition: °Eating and Drinking °· Drink enough fluid to keep your urine clear or pale yellow. This helps to keep you from becoming dehydrated. Try to drink more clear fluids, such as water. °· Do not drink alcohol. °· Limit your caffeine intake if directed by your health care provider. °· Limit your salt intake if directed by your health care provider. °Activity °· Avoid making quick movements. °¨ Rise slowly from chairs and steady yourself until you feel okay. °¨ In the morning, first sit up on the side of the bed. When you feel okay, stand slowly while you hold onto something until you know that your balance is fine. °· Move your legs often if you need to stand in one place for a long time. Tighten and relax your muscles in your legs while you are standing. °· Do not drive or operate heavy machinery if you feel dizzy. °· Avoid bending down if you feel dizzy. Place items in your home so that they are easy for you to reach without leaning over. °Lifestyle °· Do not use any tobacco products, including cigarettes, chewing tobacco, or electronic cigarettes. If you need help quitting, ask your health care provider. °· Try to reduce your stress level, such as with yoga or meditation. Talk with your health care provider if you need help. °General Instructions °· Watch your dizziness for any changes. °· Take medicines only as directed by your health care provider. Talk with your health care provider if you think that your dizziness is caused by a medicine that you are taking. °· Tell a friend or a family  member that you are feeling dizzy. If he or she notices any changes in your behavior, have this person call your health care provider. °· Keep all follow-up visits as directed by your health care provider. This is important. °SEEK MEDICAL CARE IF: °· Your dizziness does not go away. °· Your dizziness or light-headedness gets worse. °· You feel nauseous. °· You have reduced hearing. °· You have new symptoms. °· You are unsteady on your feet or you feel like the room is spinning. °SEEK IMMEDIATE MEDICAL CARE IF: °· You vomit or have diarrhea and are unable to eat or drink anything. °· You have problems talking, walking, swallowing, or using your arms, hands, or legs. °· You feel generally weak. °· You are not thinking clearly or you have trouble forming sentences. It may take a friend or family member to notice this. °· You have chest pain, abdominal pain, shortness of breath, or sweating. °· Your vision changes. °· You notice any bleeding. °· You have a headache. °· You have neck pain or a stiff neck. °· You have a fever. °  °This information is not intended to replace advice given to you by your health care provider. Make sure you discuss any questions you have with your health care provider. °  °Document Released: 05/19/2001 Document Revised: 04/09/2015 Document Reviewed: 11/19/2014 °Elsevier Interactive Patient Education ©2016 [employer] ° °Back Pain, Adult °Back pain is very common in adults. The cause of back pain is rarely dangerous and   the pain often gets better over time. The cause of your back pain may not be known. Some common causes of back pain include: °· Strain of the muscles or ligaments supporting the spine. °· Wear and tear (degeneration) of the spinal disks. °· Arthritis. °· Direct injury to the back. °For many people, back pain may return. Since back pain is rarely dangerous, most people can learn to manage this condition on their own. °HOME CARE INSTRUCTIONS °Watch your back pain for any  changes. The following actions may help to lessen any discomfort you are feeling: °· Remain active. It is stressful on your back to sit or stand in one place for long periods of time. Do not sit, drive, or stand in one place for more than 30 minutes at a time. Take short walks on even surfaces as soon as you are able. Try to increase the length of time you walk each day. °· Exercise regularly as directed by your health care provider. Exercise helps your back heal faster. It also helps avoid future injury by keeping your muscles strong and flexible. °· Do not stay in bed. Resting more than 1-2 days can delay your recovery. °· Pay attention to your body when you bend and lift. The most comfortable positions are those that put less stress on your recovering back. Always use proper lifting techniques, including: °¨ Bending your knees. °¨ Keeping the load close to your body. °¨ Avoiding twisting. °· Find a comfortable position to sleep. Use a firm mattress and lie on your side with your knees slightly bent. If you lie on your back, put a pillow under your knees. °· Avoid feeling anxious or stressed. Stress increases muscle tension and can worsen back pain. It is important to recognize when you are anxious or stressed and learn ways to manage it, such as with exercise. °· Take medicines only as directed by your health care provider. Over-the-counter medicines to reduce pain and inflammation are often the most helpful. Your health care provider may prescribe muscle relaxant drugs. These medicines help dull your pain so you can more quickly return to your normal activities and healthy exercise. °· Apply ice to the injured area: °¨ Put ice in a plastic bag. °¨ Place a towel between your skin and the bag. °¨ Leave the ice on for 20 minutes, 2-3 times a day for the first 2-3 days. After that, ice and heat may be alternated to reduce pain and spasms. °· Maintain a healthy weight. Excess weight puts extra stress on your back and  makes it difficult to maintain good posture. °SEEK MEDICAL CARE IF: °· You have pain that is not relieved with rest or medicine. °· You have increasing pain going down into the legs or buttocks. °· You have pain that does not improve in one week. °· You have night pain. °· You lose weight. °· You have a fever or chills. °SEEK IMMEDIATE MEDICAL CARE IF:  °· You develop new bowel or bladder control problems. °· You have unusual weakness or numbness in your arms or legs. °· You develop nausea or vomiting. °· You develop abdominal pain. °· You feel faint. °  °This information is not intended to replace advice given to you by your health care provider. Make sure you discuss any questions you have with your health care provider. °  °Document Released: 11/23/2005 Document Revised: 12/14/2014 Document Reviewed: 03/27/2014 °Elsevier Interactive Patient Education ©2016 Elsevier Inc. ° °

## 2016-02-25 NOTE — ED Notes (Signed)
Patient with no complaints at this time. Respirations even and unlabored. Skin warm/dry. Discharge instructions reviewed with patient at this time. Patient given opportunity to voice concerns/ask questions. Patient discharged at this time and left Emergency Department, via wheelchair.   

## 2016-02-25 NOTE — ED Provider Notes (Signed)
[facility] Emergency Department Provider Note  ____________________________________________  Time seen: Approximately 2343 PM  I have reviewed the triage vital signs and the nursing notes.   HISTORY  Chief Complaint Spasms and Back Pain    HPI Sherry Owens is a 75 y.o. female who comes into the hospital today with some cramps in her back. The patient reports that she could not get out of bed. She reports that the symptoms started about 2 hours ago. She was doing some gardening today and doing a lot of bending and lifting. She denies pain today but has a history of back pain and arthritis. The patient reports that she tried for an hour to get out of bed but she could not do so. Her back is locked up before but it has not been this bad. The patient reports that she was having spasms all across her lower back. She felt as though she couldn't breathe. She denies chest pain but she did have some dizziness. She reports that her pain as a 7 out of 10 in intensity. She felt somewhat hot and the head as well with the pain. She felt as though the pain was so bad it was taking her breath. The patient did not take any medication for the symptoms but they did call EMS and have her brought in for evaluation.   Past Medical History  Diagnosis Date  . Hypertension   . Arthritis   . Anxiety     There are no active problems to display for this patient.   Past Surgical History  Procedure Laterality Date  . Abdominal hysterectomy      Current Outpatient Rx  Name  Route  Sig  Dispense  Refill  . diazepam (VALIUM) 2 MG tablet   Oral   Take 1 tablet (2 mg total) by mouth every 8 (eight) hours as needed for anxiety.   12 tablet   0     Allergies Review of patient's allergies indicates no known allergies.  History reviewed. No pertinent family history.  Social History Social History  Substance Use Topics  . Smoking status: Never Smoker   . Smokeless tobacco:  None  . Alcohol Use: No    Review of Systems Constitutional: No fever/chills Eyes: No visual changes. ENT: No sore throat. Cardiovascular: Denies chest pain. Respiratory: Denies shortness of breath. Gastrointestinal: No abdominal pain.  No nausea, no vomiting.  No diarrhea.  No constipation. Genitourinary: Negative for dysuria. Musculoskeletal: back pain. Skin: Negative for rash. Neurological: dizziness  10-point ROS otherwise negative.  ____________________________________________   PHYSICAL EXAM:  VITAL SIGNS: ED Triage Vitals  Enc Vitals Group     BP 02/24/16 2236 218/92 mmHg     Pulse Rate 02/24/16 2236 62     Resp 02/24/16 2236 16     Temp 02/24/16 2236 97.5 F (36.4 C)     Temp Source 02/24/16 2236 Oral     SpO2 02/24/16 2236 99 %     Weight 02/24/16 2236 182 lb (82.555 kg)     Height 02/24/16 2236 5\' 5"  (1.651 m)     Head Cir --      Peak Flow --      Pain Score 02/24/16 2237 10     Pain Loc --      Pain Edu? --      Excl. in [place]? --     Constitutional: Alert and oriented. Well appearing and in moderate distress. Eyes: Conjunctivae are normal. PERRL. EOMI.  Head: Atraumatic. Nose: No congestion/rhinnorhea. Mouth/Throat: Mucous membranes are moist.  Oropharynx non-erythematous. Cardiovascular: Normal rate, regular rhythm. Grossly normal heart sounds.  Good peripheral circulation. Respiratory: Normal respiratory effort.  No retractions. Lungs CTAB. Gastrointestinal: Soft and nontender. No distention. Positive bowel sounds Musculoskeletal: No lower extremity tenderness nor edema. No tenderness to palpation of lower back, negative straight leg raise Neurologic:  Normal speech and language. No gross focal neurologic deficits are appreciated.  Skin:  Skin is warm, dry and intact.  Psychiatric: Mood and affect are normal.   ____________________________________________   LABS (all labs ordered are listed, but only abnormal results are displayed)  Labs  Reviewed  CBC - Abnormal; Notable for the following:    Platelets 148 (*)    All other components within normal limits  COMPREHENSIVE METABOLIC PANEL - Abnormal; Notable for the following:    Sodium 132 (*)    Potassium 3.2 (*)    BUN 26 (*)    Creatinine, Ser 1.13 (*)    GFR calc non Af Amer 47 (*)    GFR calc Af Amer 54 (*)    All other components within normal limits  TROPONIN I   ____________________________________________  EKG  ED ECG REPORT I, Loney Hering, the attending physician, personally viewed and interpreted this ECG.   Date: 02/25/2016  EKG Time: 115  Rate: 59  Rhythm: sinus bradycardia  Axis: normal  Intervals:none  ST&T Change: normal  ____________________________________________  RADIOLOGY  none ____________________________________________   PROCEDURES  Procedure(s) performed: None  Critical Care performed: No  ____________________________________________   INITIAL IMPRESSION / ASSESSMENT AND PLAN / ED COURSE  Pertinent labs & imaging results that were available during my care of the patient were reviewed by me and considered in my medical decision making (see chart for details).  This is a 75 year old female who comes into the hospital today with some back spasm after spending some time gardening today. The patient has no significant tenderness to palpation but reports that spasms whenever she tries to get up. I did give the patient a dose of Valium and check some blood work as the patient was also complaining of dizziness. After the Valium the patient reports that she feels much better. Her dizziness is gone and she has no further complaints. The patient be discharged home to follow-up with her primary care physician. The patient has no other concerns at this time. ____________________________________________   FINAL CLINICAL IMPRESSION(S) / ED DIAGNOSES  Final diagnoses:  Bilateral low back pain without sciatica  Muscle spasm   Dizziness      Loney Hering, MD 02/25/16 [phone]

## 2017-01-11 ENCOUNTER — Other Ambulatory Visit: Payer: Self-pay | Admitting: Physician Assistant

## 2017-01-11 DIAGNOSIS — Z1231 Encounter for screening mammogram for malignant neoplasm of breast: Secondary | ICD-10-CM

## 2017-02-09 ENCOUNTER — Ambulatory Visit
Admission: RE | Admit: 2017-02-09 | Discharge: 2017-02-09 | Disposition: A | Discharge status: Home / Self Care | Payer: Medicare Other | Source: Ambulatory Visit | Attending: Physician Assistant | Admitting: Physician Assistant

## 2017-02-09 DIAGNOSIS — Z1231 Encounter for screening mammogram for malignant neoplasm of breast: Secondary | ICD-10-CM | POA: Diagnosis present

## 2017-04-16 ENCOUNTER — Emergency Department
Admission: EM | Admit: 2017-04-16 | Discharge: 2017-04-16 | Disposition: A | Discharge status: Home / Self Care | Payer: Medicare Other | Attending: Emergency Medicine | Admitting: Emergency Medicine

## 2017-04-16 DIAGNOSIS — I10 Essential (primary) hypertension: Secondary | ICD-10-CM | POA: Diagnosis not present

## 2017-04-16 DIAGNOSIS — R5383 Other fatigue: Secondary | ICD-10-CM | POA: Insufficient documentation

## 2017-04-16 DIAGNOSIS — T733XXA Exhaustion due to excessive exertion, initial encounter: Secondary | ICD-10-CM

## 2017-04-16 DIAGNOSIS — R001 Bradycardia, unspecified: Secondary | ICD-10-CM | POA: Diagnosis not present

## 2017-04-16 LAB — COMPREHENSIVE METABOLIC PANEL
ALT: 19 U/L (ref 14–54)
AST: 31 U/L (ref 15–41)
Albumin: 4.1 g/dL (ref 3.5–5.0)
Alkaline Phosphatase: 103 U/L (ref 38–126)
Anion gap: 8 (ref 5–15)
BUN: 23 mg/dL — AB (ref 6–20)
CHLORIDE: 107 mmol/L (ref 101–111)
CO2: 23 mmol/L (ref 22–32)
CREATININE: 1.09 mg/dL — AB (ref 0.44–1.00)
Calcium: 9.2 mg/dL (ref 8.9–10.3)
GFR calc Af Amer: 56 mL/min — ABNORMAL LOW (ref >60)
GFR, EST NON AFRICAN AMERICAN: 48 mL/min — AB (ref >60)
GLUCOSE: 91 mg/dL (ref 65–99)
Potassium: 3.8 mmol/L (ref 3.5–5.1)
Sodium: 138 mmol/L (ref 135–145)
Total Bilirubin: 1.2 mg/dL (ref 0.3–1.2)
Total Protein: 7.8 g/dL (ref 6.5–8.1)

## 2017-04-16 LAB — TROPONIN I

## 2017-04-16 LAB — URINALYSIS, COMPLETE (UACMP) WITH MICROSCOPIC
BACTERIA UA: NONE SEEN
Bilirubin Urine: NEGATIVE
Glucose, UA: NEGATIVE mg/dL
Hgb urine dipstick: NEGATIVE
KETONES UR: NEGATIVE mg/dL
Leukocytes, UA: NEGATIVE
Nitrite: NEGATIVE
PH: 6 (ref 5.0–8.0)
PROTEIN: NEGATIVE mg/dL
RBC / HPF: NONE SEEN RBC/hpf (ref 0–5)
SQUAMOUS EPITHELIAL / LPF: NONE SEEN
Specific Gravity, Urine: 1.006 (ref 1.005–1.030)
WBC UA: NONE SEEN WBC/hpf (ref 0–5)

## 2017-04-16 LAB — CBC
HEMATOCRIT: 41.3 % (ref 35.0–47.0)
Hemoglobin: 13.9 g/dL (ref 12.0–16.0)
MCH: 32.8 pg (ref 26.0–34.0)
MCHC: 33.7 g/dL (ref 32.0–36.0)
MCV: 97.3 fL (ref 80.0–100.0)
PLATELETS: 158 10*3/uL (ref 150–440)
RBC: 4.24 MIL/uL (ref 3.80–5.20)
RDW: 12.8 % (ref 11.5–14.5)
WBC: 5.1 10*3/uL (ref 3.6–11.0)

## 2017-04-16 MED ORDER — SODIUM CHLORIDE 0.9 % IV SOLN
1000.0000 mL | Freq: Once | INTRAVENOUS | Status: AC
  Administered 2017-04-16: 1000 mL via INTRAVENOUS

## 2017-04-16 MED ORDER — ACETAMINOPHEN 325 MG PO TABS
650.0000 mg | ORAL_TABLET | Freq: Once | ORAL | Status: AC
  Administered 2017-04-16: 650 mg via ORAL
  Filled 2017-04-16: qty 2

## 2017-04-16 NOTE — ED Provider Notes (Addendum)
[facility] Emergency Department Provider Note   ____________________________________________    I have reviewed the triage vital signs and the nursing notes.   HISTORY  Chief Complaint Fatigue    HPI Sherry Owens is a 76 y.o. female who presents with complaints of fatigue. Patient reports over the last week she has stayed up very late multiple times because she has felt that she has needed to get things done. In fact one day she stayed up all night long. She states she feels "so tired". No chest pain. No fevers. No cough or shortness of breath. No dysuria. No abdominal pain. She states she needs rest. She denies dizziness or palpitations.   Past Medical History:  Diagnosis Date  . Anxiety   . Arthritis   . Hypertension     There are no active problems to display for this patient.   Past Surgical History:  Procedure Laterality Date  . ABDOMINAL HYSTERECTOMY      Prior to Admission medications   Not on File     Allergies Patient has no known allergies.  Family History  Problem Relation Age of Onset  . Breast cancer Maternal Aunt   . Breast cancer Paternal Aunt     Social History Social History  Substance Use Topics  . Smoking status: Never Smoker  . Smokeless tobacco: Not on file  . Alcohol use No    Review of Systems  Constitutional: No fever/chills. Fatigue as above Eyes: No visual changes.  ENT: No sore throat. Cardiovascular: Denies chest pain. Respiratory: Denies shortness of breath. Gastrointestinal: No abdominal pain.  No nausea, no vomiting.   Genitourinary: Negative for dysuria. Musculoskeletal: Negative for back pain. Skin: Negative for rash. Neurological: Negative for headaches or weakness   ____________________________________________   PHYSICAL EXAM:  VITAL SIGNS: ED Triage Vitals  Enc Vitals Group     BP      Pulse      Resp      Temp      Temp src      SpO2      Weight      Height    Head Circumference      Peak Flow      Pain Score      Pain Loc      Pain Edu?      Excl. in [place]?    Constitutional: Alert and oriented. No acute distress. Pleasant and interactive Eyes: Conjunctivae are normal.  Head: Atraumatic. Nose: No congestion/rhinnorhea. Mouth/Throat: Mucous membranes are moist.    Cardiovascular: Normal rate, regular rhythm. Grossly normal heart sounds.  Good peripheral circulation. Respiratory: Normal respiratory effort.  No retractions. Lungs CTAB. Gastrointestinal: Soft and nontender. No distention.  No CVA tenderness. Genitourinary: deferred Musculoskeletal: No lower extremity tenderness nor edema.  Warm and well perfused Neurologic:  Normal speech and language. No gross focal neurologic deficits are appreciated.  Skin:  Skin is warm, dry and intact. No rash noted. Psychiatric: Mood and affect are normal. Speech and behavior are normal.  ____________________________________________   LABS (all labs ordered are listed, but only abnormal results are displayed)  Labs Reviewed  COMPREHENSIVE METABOLIC PANEL - Abnormal; Notable for the following:       Result Value   BUN 23 (*)    Creatinine, Ser 1.09 (*)    GFR calc non Af Amer 48 (*)    GFR calc Af Amer 56 (*)    All other components within normal limits  URINALYSIS,  COMPLETE (UACMP) WITH MICROSCOPIC - Abnormal; Notable for the following:    Color, Urine YELLOW (*)    APPearance CLEAR (*)    All other components within normal limits  CBC  TROPONIN I   ____________________________________________  EKG ED ECG REPORT I, Lavonia Drafts, the attending physician, personally viewed and interpreted this ECG.  Date: 05/07/2017  Rate: 59 Rhythm: Sinus bradycardia QRS Axis: normal Intervals: normal ST/T Wave abnormalities: normal Conduction Disturbances: none Narrative Interpretation:  unremarkable  ____________________________________________  RADIOLOGY  None ____________________________________________   PROCEDURES  Procedure(s) performed: No    Critical Care performed: No ____________________________________________   INITIAL IMPRESSION / ASSESSMENT AND PLAN / ED COURSE  Pertinent labs & imaging results that were available during my care of the patient were reviewed by me and considered in my medical decision making (see chart for details).  Patient well-appearing in no acute distress. Only symptom is fatigue. We will check labs, EKG, urinalysis.  Workup is unremarkable, mild dehydration given elevated BUN. Patient received IV fluids and states she feels much better after resting in the emergency department. She is anxious to go home and sleep in her own bed, I feel this is reasonable. No indication for admission at this time. She notes she can return anytime.    ____________________________________________   FINAL CLINICAL IMPRESSION(S) / ED DIAGNOSES  Final diagnoses:  Fatigue due to excessive exertion, initial encounter  Bradycardia      NEW MEDICATIONS STARTED DURING THIS VISIT:  There are no discharge medications for this patient.    Note:  This document was prepared using Dragon voice recognition software and may include unintentional dictation errors.    Lavonia Drafts, MD 04/16/17 Virl Cagey    Lavonia Drafts, MD 05/07/17 [phone]

## 2017-04-16 NOTE — Discharge Instructions (Signed)
Do not take metoprolol please and follow up with your PCP

## 2017-04-16 NOTE — ED Triage Notes (Signed)
Pt brought in via [facility] with weakness x1 week that has worsened today.  Pt was recently diagnosed with with anemia and was given iron pills by PCP.  Pt states that she is sometimes non compliant with medication.  Pt states that she has been staying up all night due to several big family events and pushing herself to do too much lately.  Pt alert and oriented, but visibly weak at this time.

## 2017-10-19 ENCOUNTER — Emergency Department
Admission: EM | Admit: 2017-10-19 | Discharge: 2017-10-19 | Disposition: A | Discharge status: Home / Self Care | Payer: Medicare Other | Attending: Emergency Medicine | Admitting: Emergency Medicine

## 2017-10-19 ENCOUNTER — Encounter: Payer: Self-pay | Admitting: *Deleted

## 2017-10-19 ENCOUNTER — Emergency Department: Payer: Medicare Other

## 2017-10-19 DIAGNOSIS — N390 Urinary tract infection, site not specified: Secondary | ICD-10-CM | POA: Insufficient documentation

## 2017-10-19 DIAGNOSIS — I10 Essential (primary) hypertension: Secondary | ICD-10-CM | POA: Diagnosis not present

## 2017-10-19 DIAGNOSIS — R05 Cough: Secondary | ICD-10-CM | POA: Diagnosis present

## 2017-10-19 LAB — COMPREHENSIVE METABOLIC PANEL
ALT: 17 U/L (ref 14–54)
ANION GAP: 11 (ref 5–15)
AST: 28 U/L (ref 15–41)
Albumin: 4.4 g/dL (ref 3.5–5.0)
Alkaline Phosphatase: 100 U/L (ref 38–126)
BUN: 18 mg/dL (ref 6–20)
CHLORIDE: 102 mmol/L (ref 101–111)
CO2: 22 mmol/L (ref 22–32)
Calcium: 9.4 mg/dL (ref 8.9–10.3)
Creatinine, Ser: 1.19 mg/dL — ABNORMAL HIGH (ref 0.44–1.00)
GFR, EST AFRICAN AMERICAN: 50 mL/min — AB (ref >60)
GFR, EST NON AFRICAN AMERICAN: 43 mL/min — AB (ref >60)
Glucose, Bld: 143 mg/dL — ABNORMAL HIGH (ref 65–99)
POTASSIUM: 3.7 mmol/L (ref 3.5–5.1)
Sodium: 135 mmol/L (ref 135–145)
Total Bilirubin: 1.4 mg/dL — ABNORMAL HIGH (ref 0.3–1.2)
Total Protein: 8.5 g/dL — ABNORMAL HIGH (ref 6.5–8.1)

## 2017-10-19 LAB — TROPONIN I

## 2017-10-19 LAB — URINALYSIS, COMPLETE (UACMP) WITH MICROSCOPIC
BILIRUBIN URINE: NEGATIVE
GLUCOSE, UA: NEGATIVE mg/dL
Hgb urine dipstick: NEGATIVE
KETONES UR: NEGATIVE mg/dL
Nitrite: POSITIVE — AB
PROTEIN: NEGATIVE mg/dL
RBC / HPF: NONE SEEN RBC/hpf (ref 0–5)
SQUAMOUS EPITHELIAL / LPF: NONE SEEN
Specific Gravity, Urine: 1.005 (ref 1.005–1.030)
pH: 6 (ref 5.0–8.0)

## 2017-10-19 LAB — CBC
HEMATOCRIT: 43.1 % (ref 35.0–47.0)
HEMOGLOBIN: 14.3 g/dL (ref 12.0–16.0)
MCH: 32.2 pg (ref 26.0–34.0)
MCHC: 33.1 g/dL (ref 32.0–36.0)
MCV: 97.3 fL (ref 80.0–100.0)
Platelets: 158 10*3/uL (ref 150–440)
RBC: 4.43 MIL/uL (ref 3.80–5.20)
RDW: 12.7 % (ref 11.5–14.5)
WBC: 5.4 10*3/uL (ref 3.6–11.0)

## 2017-10-19 LAB — LIPASE, BLOOD: LIPASE: 53 U/L — AB (ref 11–51)

## 2017-10-19 MED ORDER — SODIUM CHLORIDE 0.9 % IV BOLUS (SEPSIS)
1000.0000 mL | Freq: Once | INTRAVENOUS | Status: AC
  Administered 2017-10-19: 1000 mL via INTRAVENOUS

## 2017-10-19 MED ORDER — FENTANYL CITRATE (PF) 100 MCG/2ML IJ SOLN
50.0000 ug | INTRAMUSCULAR | Status: DC | PRN
  Administered 2017-10-19: 50 ug via INTRAVENOUS
  Filled 2017-10-19: qty 2

## 2017-10-19 MED ORDER — ACETAMINOPHEN 500 MG PO TABS
1000.0000 mg | ORAL_TABLET | Freq: Once | ORAL | Status: AC
  Administered 2017-10-19: 1000 mg via ORAL
  Filled 2017-10-19: qty 2

## 2017-10-19 MED ORDER — ONDANSETRON HCL 4 MG/2ML IJ SOLN
4.0000 mg | Freq: Once | INTRAMUSCULAR | Status: AC | PRN
  Administered 2017-10-19: 4 mg via INTRAVENOUS
  Filled 2017-10-19: qty 2

## 2017-10-19 MED ORDER — CEPHALEXIN 500 MG PO CAPS: 500.0000 mg | ORAL_CAPSULE | Freq: Three times a day (TID) | ORAL | 0 refills | Status: DC

## 2017-10-19 MED ORDER — CEFTRIAXONE SODIUM IN DEXTROSE 20 MG/ML IV SOLN
1.0000 g | Freq: Once | INTRAVENOUS | Status: AC
  Administered 2017-10-19: 1 g via INTRAVENOUS
  Filled 2017-10-19: qty 50

## 2017-10-19 NOTE — ED Provider Notes (Addendum)
[facility] Emergency Department Provider Note  ____________________________________________   I have reviewed the triage vital signs and the nursing notes.   HISTORY  Chief Complaint Headache and Nausea    HPI Sherry Owens is a 76 y.o. female who presents today complaining of a host of different complaints.  She complains that she has a runny nose and a cough this is her chief complaint to me.  She states she has had a runny nose and cough for day or 2.  She describes this as a "cold".  Patient states she has no chest pain she is not really short of breath.  She denies any fever or chills.  She does sometimes get sweaty she states.  She is not having night sweats or weight loss to the extent that she can say although she thinks her scale is working.  She denies any lower extremity swelling.  She states she has not had a "satisfied" BM for several weeks but she did have a bowel movement after arriving here.  She is trying to take a stool softener the name of which she cannot recall.  She states that she has had a gradual onset not worst headache of life headache which is associated with her cough.  She forgot to mention a headache until I specified it in her review of systems.  She denies any recent change in medication she did not take her home medications today except for her isosorbide.  She states sometimes she gets urinary tract infections she is not sure if she has one now but she would not be surprised.   She has no melena or bright red blood per rectum, focal neurologic deficits not the worst headache of life.  She also states she has been eating and drinking very well at home with no difficulty   When asked her why she is here she states "because I have a cold".  Past Medical History:  Diagnosis Date  . Anxiety   . Arthritis   . Hypertension     There are no active problems to display for this patient.   Past Surgical History:  Procedure Laterality  Date  . ABDOMINAL HYSTERECTOMY      Prior to Admission medications   Not on File    Allergies Patient has no known allergies.  Family History  Problem Relation Age of Onset  . Breast cancer Maternal Aunt   . Breast cancer Paternal Aunt     Social History Social History   Tobacco Use  . Smoking status: Never Smoker  Substance Use Topics  . Alcohol use: No  . Drug use: Not on file    Review of Systems Constitutional: No fever/chills Eyes: No visual changes. ENT: No sore throat. No stiff neck no neck pain Cardiovascular: Denies chest pain. Respiratory: Positive cough Gastrointestinal:   no vomiting.  No diarrhea.  Positive constipation. Genitourinary: Negative for dysuria. Musculoskeletal: Negative lower extremity swelling Skin: Negative for rash. Neurological: Negative for severe headaches, focal weakness or numbness.   ____________________________________________   PHYSICAL EXAM:  VITAL SIGNS: ED Triage Vitals  Enc Vitals Group     BP 10/19/17 0935 (!) 153/95     Pulse Rate 10/19/17 0935 79     Resp 10/19/17 0935 20     Temp 10/19/17 0935 98.4 F (36.9 C)     Temp Source 10/19/17 0935 Oral     SpO2 10/19/17 0935 97 %     Weight 10/19/17 0937 172 lb (78  kg)     Height 10/19/17 0937 5\' 5"  (1.651 m)     Head Circumference --      Peak Flow --      Pain Score 10/19/17 0937 10     Pain Loc --      Pain Edu? --      Excl. in [place]? --     Constitutional: Alert and oriented. Well appearing and in no acute distress.  Patient seems somewhat anxious, but in no acute medical distress Eyes: Conjunctivae are normal Head: Atraumatic HEENT: Positive clear congestion/rhinnorhea. Mucous membranes are moist.  Oropharynx non-erythematous Neck:   Nontender with no meningismus, no masses, no stridor Cardiovascular: Normal rate, regular rhythm. Grossly normal heart sounds.  Good peripheral circulation. Respiratory: Normal respiratory effort.  No retractions. Lungs CTAB.   Occasional cough Abdominal: Soft and nontender. No distention. No guarding no rebound Back:  There is no focal tenderness or step off.  there is no midline tenderness there are no lesions noted. there is no CVA tenderness Musculoskeletal: No lower extremity tenderness, no upper extremity tenderness. No joint effusions, no DVT signs strong distal pulses no edema Neurologic:  Normal speech and language. No gross focal neurologic deficits are appreciated.  Skin:  Skin is warm, dry and intact. No rash noted. Psychiatric: Mood and affect are anxious. Speech and behavior are normal.  ____________________________________________   LABS (all labs ordered are listed, but only abnormal results are displayed)  Labs Reviewed  LIPASE, BLOOD - Abnormal; Notable for the following components:      Result Value   Lipase 53 (*)    All other components within normal limits  COMPREHENSIVE METABOLIC PANEL - Abnormal; Notable for the following components:   Glucose, Bld 143 (*)    Creatinine, Ser 1.19 (*)    Total Protein 8.5 (*)    Total Bilirubin 1.4 (*)    GFR calc non Af Amer 43 (*)    GFR calc Af Amer 50 (*)    All other components within normal limits  CBC  URINALYSIS, COMPLETE (UACMP) WITH MICROSCOPIC  CBG MONITORING, ED    Pertinent labs  results that were available during my care of the patient were reviewed by me and considered in my medical decision making (see chart for details). ____________________________________________  EKG  I personally interpreted any EKGs ordered by me or triage Sinus rhythm rate 62 bpm no acute ST elevation no acute ST depression, midline LAD. ____________________________________________  RADIOLOGY  Pertinent labs & imaging results that were available during my care of the patient were reviewed by me and considered in my medical decision making (see chart for details). If possible, patient and/or family made aware of any abnormal  findings. ____________________________________________    PROCEDURES  Procedure(s) performed: None  Procedures  Critical Care performed: None  ____________________________________________   INITIAL IMPRESSION / ASSESSMENT AND PLAN / ED COURSE  Pertinent labs & imaging results that were available during my care of the patient were reviewed by me and considered in my medical decision making (see chart for details).  Patient here with a host of different complaints including chronic constipation which she has had "for years, as well as URI symptoms including cough and runny nose and feeling generally debilitated from it.  We will get a chest x-ray to rule out pneumonia although low suspicion based on my exam.  Patient complains of constipation but has no abdominal tenderness or abdominal pain she is eating and drinking well.  She had a  bowel movement here which she states helped alleviate the symptoms.  She will for nontender rectal exam.  Will obtain a KUB as a precaution. Low suspicion for obstruction or appendicitis or other pathology of that variety.  Patient, after I left the room call be back to remind me that she had also had reflux disease when she lies down, she is taking antacids for that.  It does not seem to be getting better.  Is worse when she drinks soda she has not had that today or yesterday however and she is reassured by that.  Patient does complain of a headache, blood pressure was initially somewhat elevated but she did not take all of her blood pressure medication, she is neurologically intact, do not think a CT scan likely would show bleed meningitis or other acute intracranial pathology, we will give her IV fluid as I suspect she may be feeling somewhat dehydrated from her runny nose and cough symptoms and we will continue to assess.  ----------------------------------------- 3:31 PM on 10/19/2017 -----------------------------------------  Signed out at the end of my  shift to dr. Archie Balboa.    ____________________________________________   FINAL CLINICAL IMPRESSION(S) / ED DIAGNOSES  Final diagnoses:  None      This chart was dictated using voice recognition software.  Despite best efforts to proofread,  errors can occur which can change meaning.      Schuyler Amor, MD 10/19/17 1421    Schuyler Amor, MD 10/19/17 [phone]

## 2017-10-19 NOTE — ED Notes (Signed)
ED Provider at bedside. 

## 2017-10-19 NOTE — ED Notes (Signed)
Pt presents today with headache, and acid reflux that has lasted 2 weeks, pt states she is nausea and dizzy. Pt is NAD

## 2017-10-19 NOTE — ED Notes (Signed)
Pt taken to bathroom.  Had bowel movement.  Back to subwait in wheelchair.

## 2017-10-19 NOTE — ED Triage Notes (Signed)
States headache, nausea, cold sweats and dizziness for 5 days, states she couldn't even get up to the bathroom today because of the nausea, also states feeling constipated, pt resting with eyes closed in wheelchair, awake and alert in no acute distress

## 2017-10-19 NOTE — ED Provider Notes (Signed)
UA with positive nitrite concerning for infection. Will plan on giving dose of abx here and discharging with further.   Nance Pear, MD 10/19/17 [phone]

## 2017-10-19 NOTE — Discharge Instructions (Signed)
Please seek medical attention for any high fevers, chest pain, shortness of breath, change in behavior, persistent vomiting, bloody stool or any other new or concerning symptoms.  

## 2017-12-07 DIAGNOSIS — I209 Angina pectoris, unspecified: Secondary | ICD-10-CM

## 2017-12-07 HISTORY — DX: Angina pectoris, unspecified: I20.9

## 2018-02-25 ENCOUNTER — Other Ambulatory Visit: Payer: Self-pay | Admitting: Student

## 2018-02-25 DIAGNOSIS — K30 Functional dyspepsia: Secondary | ICD-10-CM

## 2018-02-25 DIAGNOSIS — K219 Gastro-esophageal reflux disease without esophagitis: Secondary | ICD-10-CM

## 2018-02-25 DIAGNOSIS — R142 Eructation: Secondary | ICD-10-CM

## 2018-03-08 ENCOUNTER — Ambulatory Visit: Payer: Medicare Other

## 2018-03-14 ENCOUNTER — Ambulatory Visit
Admission: RE | Admit: 2018-03-14 | Discharge: 2018-03-14 | Disposition: A | Discharge status: Home / Self Care | Payer: Medicare Other | Source: Ambulatory Visit | Attending: Student | Admitting: Student

## 2018-03-14 DIAGNOSIS — K317 Polyp of stomach and duodenum: Secondary | ICD-10-CM | POA: Insufficient documentation

## 2018-03-14 DIAGNOSIS — K30 Functional dyspepsia: Secondary | ICD-10-CM | POA: Diagnosis present

## 2018-03-14 DIAGNOSIS — K449 Diaphragmatic hernia without obstruction or gangrene: Secondary | ICD-10-CM | POA: Insufficient documentation

## 2018-03-14 DIAGNOSIS — K219 Gastro-esophageal reflux disease without esophagitis: Secondary | ICD-10-CM | POA: Diagnosis present

## 2018-03-14 DIAGNOSIS — R142 Eructation: Secondary | ICD-10-CM | POA: Diagnosis present

## 2018-03-24 ENCOUNTER — Emergency Department
Admission: EM | Admit: 2018-03-24 | Discharge: 2018-03-25 | Disposition: A | Discharge status: Home / Self Care | Payer: Medicare Other | Attending: Emergency Medicine | Admitting: Emergency Medicine

## 2018-03-24 ENCOUNTER — Emergency Department: Payer: Medicare Other

## 2018-03-24 DIAGNOSIS — R55 Syncope and collapse: Secondary | ICD-10-CM | POA: Diagnosis present

## 2018-03-24 DIAGNOSIS — I10 Essential (primary) hypertension: Secondary | ICD-10-CM | POA: Insufficient documentation

## 2018-03-24 DIAGNOSIS — Z79899 Other long term (current) drug therapy: Secondary | ICD-10-CM | POA: Insufficient documentation

## 2018-03-24 DIAGNOSIS — R0989 Other specified symptoms and signs involving the circulatory and respiratory systems: Secondary | ICD-10-CM | POA: Diagnosis not present

## 2018-03-24 LAB — CBC
HCT: 39 % (ref 35.0–47.0)
Hemoglobin: 13.1 g/dL (ref 12.0–16.0)
MCH: 33.4 pg (ref 26.0–34.0)
MCHC: 33.7 g/dL (ref 32.0–36.0)
MCV: 98.9 fL (ref 80.0–100.0)
Platelets: 142 K/uL — ABNORMAL LOW (ref 150–440)
RBC: 3.94 MIL/uL (ref 3.80–5.20)
RDW: 13.6 % (ref 11.5–14.5)
WBC: 5.7 K/uL (ref 3.6–11.0)

## 2018-03-24 NOTE — ED Triage Notes (Signed)
Patient report near syncopal episode approx 45 minutes ago. Patient reports like she has felt like she has to cough/something stuck in chest. Patient reports she felt similar in the past when she had walking pneumonia.

## 2018-03-25 DIAGNOSIS — R55 Syncope and collapse: Secondary | ICD-10-CM | POA: Diagnosis not present

## 2018-03-25 LAB — BASIC METABOLIC PANEL
Anion gap: 8 (ref 5–15)
BUN: 23 mg/dL — ABNORMAL HIGH (ref 6–20)
CALCIUM: 9.1 mg/dL (ref 8.9–10.3)
CO2: 24 mmol/L (ref 22–32)
CREATININE: 1.25 mg/dL — AB (ref 0.44–1.00)
Chloride: 107 mmol/L (ref 101–111)
GFR calc non Af Amer: 40 mL/min — ABNORMAL LOW (ref >60)
GFR, EST AFRICAN AMERICAN: 47 mL/min — AB (ref >60)
GLUCOSE: 134 mg/dL — AB (ref 65–99)
Potassium: 3.6 mmol/L (ref 3.5–5.1)
Sodium: 139 mmol/L (ref 135–145)

## 2018-03-25 LAB — URINALYSIS, COMPLETE (UACMP) WITH MICROSCOPIC
Bacteria, UA: NONE SEEN
Bilirubin Urine: NEGATIVE
Glucose, UA: NEGATIVE mg/dL
Hgb urine dipstick: NEGATIVE
Ketones, ur: NEGATIVE mg/dL
Leukocytes, UA: NEGATIVE
NITRITE: NEGATIVE
Protein, ur: NEGATIVE mg/dL
SPECIFIC GRAVITY, URINE: 1.006 (ref 1.005–1.030)
SQUAMOUS EPITHELIAL / LPF: NONE SEEN
pH: 7 (ref 5.0–8.0)

## 2018-03-25 LAB — TROPONIN I

## 2018-03-25 MED ORDER — SODIUM CHLORIDE 0.9 % IV BOLUS
500.0000 mL | INTRAVENOUS | Status: AC
  Administered 2018-03-25: 500 mL via INTRAVENOUS

## 2018-03-25 NOTE — ED Notes (Signed)
Reviewed discharge instructions and follow-up care with patient. Patient verbalized understanding of all information reviewed. Patient stable, with no distress noted at this time.    

## 2018-03-25 NOTE — Discharge Instructions (Signed)
You have been seen today in the Emergency Department (ED)  for near syncope (almost passing out).  Your workup including labs and EKG show reassuring results.  Your symptoms may be due to dehydration, so it is important that you drink plenty of non-alcoholic fluids.  Please call your regular doctor as soon as possible to schedule the next available clinic appointment to follow up with him/her regarding your visit to the ED and your symptoms.  Return to the Emergency Department (ED)  if you have any further near syncopal or syncopal episodes (pass out again) or develop ANY chest pain, pressure, tightness, trouble breathing, sudden sweating, or other symptoms that concern you.

## 2018-03-25 NOTE — ED Provider Notes (Addendum)
[facility] Emergency Department Provider Note  ____________________________________________   First MD Initiated Contact with Patient 03/24/18 2353     (approximate)  I have reviewed the triage vital signs and the nursing notes.   HISTORY  Chief Complaint Near Syncope    HPI Sherry Owens is a 77 y.o. female with Degele history as listed below who is generally healthy for her age who presents for evaluation of a near syncopal episode.  She had an active day today, spending time outside to enjoy the weather and doing some gardening.  Later this afternoon she was inside trying on some closed when she felt like everything was getting dark in the periphery of her vision and she began to feel lightheaded.  She sat down on the floor to make sure she did not pass out completely and hit her head and she called for her son to help her.  She did not fully lose consciousness but felt very lightheaded and a little bit nauseated.  She denies chest pain, shortness of breath, vomiting, abdominal pain, and dysuria.  She has not had any recent fever or chills.  She feels much better now and has been asymptomatic since the episode passed.  She has no history of heart failure.  She states that she has not been drinking much water recently and thinks she may have not been eating as much food as she usually does but without a specific reason why.  She had no numbness nor tingling in any of her extremities.  She has no balance issues except for the time that she had a near syncopal episode.  She says that she feels fine at this time.  The onset was acute and the symptoms were severe but they resolved on their own and nothing in particular made them better or worse.  She had no word finding difficulties or confusion at the time of the episode.  Past Medical History:  Diagnosis Date  . Anxiety   . Arthritis   . Hypertension     There are no active problems to display for this  patient.   Past Surgical History:  Procedure Laterality Date  . ABDOMINAL HYSTERECTOMY      Prior to Admission medications   Medication Sig Start Date End Date Taking? Authorizing Provider  cephALEXin (KEFLEX) 500 MG capsule Take 1 capsule (500 mg total) 3 (three) times daily by mouth. 10/19/17   Nance Pear, MD    Allergies Patient has no known allergies.  Family History  Problem Relation Age of Onset  . Breast cancer Maternal Aunt   . Breast cancer Paternal Aunt     Social History Social History   Tobacco Use  . Smoking status: Never Smoker  . Smokeless tobacco: Never Used  Substance Use Topics  . Alcohol use: No  . Drug use: Not on file    Review of Systems Constitutional: No fever/chills Eyes: No visual changes. ENT: No sore throat. Cardiovascular: Denies chest pain. Acute onset near syncope as described above.   Respiratory: Denies shortness of breath. Gastrointestinal: No abdominal pain.  Nausea, no vomiting.  No diarrhea.  No constipation. Genitourinary: Negative for dysuria. Musculoskeletal: Negative for neck pain.  Negative for back pain. Integumentary: Negative for rash. Neurological: Negative for headaches, focal weakness or numbness.   ____________________________________________   PHYSICAL EXAM:  VITAL SIGNS: ED Triage Vitals  Enc Vitals Group     BP 03/24/18 2331 (!) 195/92     Pulse Rate 03/24/18 2331 Marland Kitchen)  59     Resp 03/24/18 2331 18     Temp 03/24/18 2326 97.7 F (36.5 C)     Temp Source 03/24/18 2326 Oral     SpO2 03/24/18 2331 97 %     Weight 03/24/18 2327 78.5 kg (173 lb)     Height --      Head Circumference --      Peak Flow --      Pain Score 03/24/18 2326 5     Pain Loc --      Pain Edu? --      Excl. in [place]? --     Constitutional: Alert and oriented. Well appearing and in no acute distress. Eyes: Conjunctivae are normal. PERRL. EOMI. Head: Atraumatic. Nose: No congestion/rhinnorhea. Mouth/Throat: Mucous membranes  are moist. Neck: No stridor.  No meningeal signs.   Cardiovascular: Normal rate, regular rhythm. Good peripheral circulation. Grossly normal heart sounds. Respiratory: Normal respiratory effort.  No retractions. Lungs CTAB. Gastrointestinal: Soft and nontender. No distention.  Musculoskeletal: No lower extremity tenderness nor edema. No gross deformities of extremities. Neurologic:  Normal speech and language. No gross focal neurologic deficits are appreciated.  Normal grip strength bilaterally, no evidence of any weakness or focal neurological deficits.  The patient is alert and oriented having no trouble communicating. Skin:  Skin is warm, dry and intact. No rash noted. Psychiatric: Mood and affect are normal. Speech and behavior are normal.  ____________________________________________   LABS (all labs ordered are listed, but only abnormal results are displayed)  Labs Reviewed  BASIC METABOLIC PANEL - Abnormal; Notable for the following components:      Result Value   Glucose, Bld 134 (*)    BUN 23 (*)    Creatinine, Ser 1.25 (*)    GFR calc non Af Amer 40 (*)    GFR calc Af Amer 47 (*)    All other components within normal limits  CBC - Abnormal; Notable for the following components:   Platelets 142 (*)    All other components within normal limits  URINALYSIS, COMPLETE (UACMP) WITH MICROSCOPIC - Abnormal; Notable for the following components:   Color, Urine STRAW (*)    APPearance CLEAR (*)    All other components within normal limits  TROPONIN I   ____________________________________________  EKG  ED ECG REPORT I, Hinda Kehr, the attending physician, personally viewed and interpreted this ECG.  Date: 03/24/2018 EKG Time: 23:32 Rate: 58 Rhythm: borderline sinus bradycardia QRS Axis: normal Intervals: normal ST/T Wave abnormalities: Non-specific ST segment / T-wave changes, but no evidence of acute ischemia. Narrative Interpretation: no evidence of acute  ischemia   ____________________________________________  RADIOLOGY   ED MD interpretation: No indication for imaging  Official radiology report(s): Dg Chest 2 View  Result Date: 03/25/2018 CLINICAL DATA:  Near syncopal episode 45 minutes ago. Patient feels the need to cough with something stuck in chest. EXAM: CHEST - 2 VIEW COMPARISON:  10/19/2017 CXR, chest CT 01/02/2009 FINDINGS: The heart size and mediastinal contours are within normal limits. Slight uncoiling of the thoracic aorta with atherosclerosis. Both lungs are clear. The visualized skeletal structures are unremarkable. IMPRESSION: No active cardiopulmonary disease. Electronically Signed   By: Ashley Royalty M.D.   On: 03/25/2018 00:19    ____________________________________________   PROCEDURES  Critical Care performed: No   Procedure(s) performed:   Procedures   ____________________________________________   INITIAL IMPRESSION / ASSESSMENT AND PLAN / ED COURSE  As part of my medical decision making, I reviewed  the following data within the [facility] notes reviewed and incorporated, Labs reviewed , EKG interpreted , Old chart reviewed and Notes from prior ED visits    Differential diagnosis includes, but is not limited to, vasovagal episode, cardiogenic syncope, CVA, ACS, infectious process/sepsis.  Based on her description I think she is somewhat volume depleted and then had a vasovagal episode while she was trying all closed.  She is back to normal at this time.  She is low risk for syncope based on [place] syncope rules.  Her vital signs are reassuring and she is actually hypertensive instead of hypotensive but I do not believe that this is the cause of her episode.  She has not been having any dysuria and she has not provided a urine sample during this emergency department visit but I think that is relatively low yield.  Her CBC is normal, troponin is negative, and metabolic panel is  at baseline for her.  She feels back to normal and I gave her a 500 mL normal saline IV fluid bolus and she says that she feels well.  She does not need to be admitted at this time and I think that given the low probability of an acute CVA, particularly one that would be demonstrated on a noncontrast head CT, there is no indication for imaging at this time.  Her chest x-ray demonstrates no acute findings and she does have some chronic esophageal narrowing but she has plans to have a procedure by GI to help with that I think that is the reason that she mentioned to the triage nurse that she feels like something is caught in her throat.  Her EKG is unremarkable.     I gave my usual and customary return precautions.  She and her son understand and agree with the plan.   Clinical Course as of Mar 25 225  Fri Mar 25, 2018  0226 Patient did provide a urine sample prior to discharge and it was normal, no antibiotics are indicated.   [CF]    Clinical Course User Index [CF] Hinda Kehr, MD    ____________________________________________  FINAL CLINICAL IMPRESSION(S) / ED DIAGNOSES  Final diagnoses:  Near syncope     MEDICATIONS GIVEN DURING THIS VISIT:  Medications  sodium chloride 0.9 % bolus 500 mL (0 mLs Intravenous Stopped 03/25/18 0204)     ED Discharge Orders    None       Note:  This document was prepared using Dragon voice recognition software and may include unintentional dictation errors.    Hinda Kehr, MD 03/25/18 Howell Pringle    Hinda Kehr, MD 03/25/18 [phone]

## 2018-05-03 ENCOUNTER — Encounter: Payer: Self-pay | Admitting: *Deleted

## 2018-05-04 ENCOUNTER — Ambulatory Visit: Payer: Medicare Other | Admitting: Anesthesiology

## 2018-05-04 ENCOUNTER — Ambulatory Visit
Admission: RE | Admit: 2018-05-04 | Discharge: 2018-05-04 | Disposition: A | Discharge status: Home / Self Care | Payer: Medicare Other | Source: Ambulatory Visit | Attending: Internal Medicine | Admitting: Internal Medicine

## 2018-05-04 ENCOUNTER — Encounter
Admission: RE | Disposition: A | Discharge status: Home / Self Care | Payer: Self-pay | Source: Ambulatory Visit | Attending: Internal Medicine

## 2018-05-04 ENCOUNTER — Encounter: Payer: Self-pay | Admitting: *Deleted

## 2018-05-04 DIAGNOSIS — K640 First degree hemorrhoids: Secondary | ICD-10-CM | POA: Diagnosis not present

## 2018-05-04 DIAGNOSIS — K635 Polyp of colon: Secondary | ICD-10-CM | POA: Insufficient documentation

## 2018-05-04 DIAGNOSIS — Z79899 Other long term (current) drug therapy: Secondary | ICD-10-CM | POA: Insufficient documentation

## 2018-05-04 DIAGNOSIS — K228 Other specified diseases of esophagus: Secondary | ICD-10-CM | POA: Diagnosis not present

## 2018-05-04 DIAGNOSIS — L83 Acanthosis nigricans: Secondary | ICD-10-CM | POA: Diagnosis not present

## 2018-05-04 DIAGNOSIS — K317 Polyp of stomach and duodenum: Secondary | ICD-10-CM | POA: Diagnosis not present

## 2018-05-04 DIAGNOSIS — D124 Benign neoplasm of descending colon: Secondary | ICD-10-CM | POA: Diagnosis not present

## 2018-05-04 DIAGNOSIS — I10 Essential (primary) hypertension: Secondary | ICD-10-CM | POA: Insufficient documentation

## 2018-05-04 DIAGNOSIS — K219 Gastro-esophageal reflux disease without esophagitis: Secondary | ICD-10-CM | POA: Diagnosis not present

## 2018-05-04 DIAGNOSIS — Q403 Congenital malformation of stomach, unspecified: Secondary | ICD-10-CM | POA: Diagnosis not present

## 2018-05-04 DIAGNOSIS — F329 Major depressive disorder, single episode, unspecified: Secondary | ICD-10-CM | POA: Insufficient documentation

## 2018-05-04 DIAGNOSIS — Z800 Family history of malignant neoplasm of digestive organs: Secondary | ICD-10-CM | POA: Insufficient documentation

## 2018-05-04 DIAGNOSIS — K3189 Other diseases of stomach and duodenum: Secondary | ICD-10-CM | POA: Insufficient documentation

## 2018-05-04 DIAGNOSIS — Z1211 Encounter for screening for malignant neoplasm of colon: Secondary | ICD-10-CM | POA: Insufficient documentation

## 2018-05-04 DIAGNOSIS — F419 Anxiety disorder, unspecified: Secondary | ICD-10-CM | POA: Diagnosis not present

## 2018-05-04 DIAGNOSIS — K208 Other esophagitis: Secondary | ICD-10-CM | POA: Diagnosis not present

## 2018-05-04 DIAGNOSIS — R1310 Dysphagia, unspecified: Secondary | ICD-10-CM | POA: Insufficient documentation

## 2018-05-04 DIAGNOSIS — K5730 Diverticulosis of large intestine without perforation or abscess without bleeding: Secondary | ICD-10-CM | POA: Diagnosis not present

## 2018-05-04 HISTORY — DX: Herpesviral infection of urogenital system, unspecified: A60.00

## 2018-05-04 HISTORY — DX: Depression, unspecified: F32.A

## 2018-05-04 HISTORY — DX: Major depressive disorder, single episode, unspecified: F32.9

## 2018-05-04 HISTORY — DX: Gastro-esophageal reflux disease without esophagitis: K21.9

## 2018-05-04 HISTORY — DX: Angina pectoris, unspecified: I20.9

## 2018-05-04 HISTORY — PX: COLONOSCOPY WITH PROPOFOL: SHX5780

## 2018-05-04 HISTORY — PX: ESOPHAGOGASTRODUODENOSCOPY (EGD) WITH PROPOFOL: SHX5813

## 2018-05-04 SURGERY — ESOPHAGOGASTRODUODENOSCOPY (EGD) WITH PROPOFOL
Anesthesia: General

## 2018-05-04 MED ORDER — SODIUM CHLORIDE 0.9 % IV SOLN
INTRAVENOUS | Status: DC
  Administered 2018-05-04: 1000 mL via INTRAVENOUS

## 2018-05-04 MED ORDER — LIDOCAINE HCL (CARDIAC) PF 100 MG/5ML IV SOSY
PREFILLED_SYRINGE | INTRAVENOUS | Status: DC | PRN
  Administered 2018-05-04: 60 mg via INTRAVENOUS

## 2018-05-04 MED ORDER — FENTANYL CITRATE (PF) 100 MCG/2ML IJ SOLN
INTRAMUSCULAR | Status: DC | PRN
  Administered 2018-05-04: 50 ug via INTRAVENOUS
  Administered 2018-05-04 (×2): 25 ug via INTRAVENOUS

## 2018-05-04 MED ORDER — PROPOFOL 10 MG/ML IV BOLUS
INTRAVENOUS | Status: DC | PRN
  Administered 2018-05-04: 60 mg via INTRAVENOUS

## 2018-05-04 MED ORDER — PROPOFOL 500 MG/50ML IV EMUL
INTRAVENOUS | Status: DC | PRN
  Administered 2018-05-04: 100 ug/kg/min via INTRAVENOUS

## 2018-05-04 NOTE — Anesthesia Post-op Follow-up Note (Signed)
Anesthesia QCDR form completed.        

## 2018-05-04 NOTE — Transfer of Care (Signed)
Immediate Anesthesia Transfer of Care Note  Patient: Sherry Owens  Procedure(s) Performed: ESOPHAGOGASTRODUODENOSCOPY (EGD) WITH PROPOFOL (N/A ) COLONOSCOPY WITH PROPOFOL (N/A )  Patient Location: PACU  Anesthesia Type:General  Level of Consciousness: awake and alert   Airway & Oxygen Therapy: Patient Spontanous Breathing and Patient connected to nasal cannula oxygen  Post-op Assessment: Report given to RN  Post vital signs: Reviewed and stable  Last Vitals:  Vitals Value Taken Time  BP 124/65 05/04/2018  2:13 PM  Temp    Pulse 53 05/04/2018  2:14 PM  Resp 14 05/04/2018  2:14 PM  SpO2 94 % 05/04/2018  2:14 PM  Vitals shown include unvalidated device data.  Last Pain:  Vitals:   05/04/18 1229  TempSrc: Tympanic  PainSc: 0-No pain         Complications: No apparent anesthesia complications

## 2018-05-04 NOTE — Op Note (Signed)
[facility] Gastroenterology Patient Name: Sherry Owens Procedure Date: 05/04/2018 1:19 PM MRN: [id number] Account #: [id number] Date of Birth: 06-01-1941 Admit Type: Outpatient Age: 77 Room: [facility] ENDO ROOM 2 Gender: Female Note Status: Finalized Procedure:            Upper GI endoscopy Indications:          Dysphagia, Abnormal UGI series Providers:            Benay Pike. Alice Reichert MD, MD Referring MD:         Precious Bard, MD (Referring MD) Medicines:            Propofol per Anesthesia Complications:        No immediate complications. Procedure:            Pre-Anesthesia Assessment:                       - The risks and benefits of the procedure and the                        sedation options and risks were discussed with the                        patient. All questions were answered and informed                        consent was obtained.                       - Patient identification and proposed procedure were                        verified prior to the procedure by the nurse. The                        procedure was verified in the procedure room.                       - ASA Grade Assessment: III - A patient with severe                        systemic disease.                       - After reviewing the risks and benefits, the patient                        was deemed in satisfactory condition to undergo the                        procedure.                       After obtaining informed consent, the endoscope was                        passed under direct vision. Throughout the procedure,                        the patient's blood pressure, pulse, and oxygen  saturations were monitored continuously. The Endoscope                        was introduced through the mouth, and advanced to the                        third part of duodenum. The upper GI endoscopy was                        accomplished without difficulty. The patient  tolerated                        the procedure well. The upper GI endoscopy was somewhat                        difficult due to unusual anatomy. Successful completion                        of the procedure was aided by withdrawing and                        reinserting the scope. Findings:      The lumen of the proximal esophagus was moderately dilated.      A hypertonic lower esophageal sphincter was found. The scope was       withdrawn. Dilation was performed with a Maloney dilator with no       resistance at 23 Fr.      There is no endoscopic evidence of inflammation, stenosis, stricture or       mass in the lower third of the esophagus. Biopsies were obtained from       the proximal and distal esophagus with cold forceps for histology of       suspected eosinophilic esophagitis.      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      The examined duodenum was normal.      Multiple 2 to 7 mm pedunculated and sessile polyps with no bleeding and       no stigmata of recent bleeding were found in the gastric body. Biopsies       were taken with a cold forceps for histology.      The exam was otherwise without abnormality. Impression:           - Dilation in the proximal esophagus.                       - Hypertonic lower esophageal sphincter. Dilated.                       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                       - Normal examined duodenum. Recommendation:       - Await pathology results.                       - Proceed with colonoscopy Procedure  Code(s):    --- Professional ---                       [phone], Esophagogastroduodenoscopy, flexible, transoral;                        with biopsy, single or multiple                       43450, Dilation of esophagus, by unguided sound or                        bougie, single or multiple passes Diagnosis  Code(s):    --- Professional ---                       R93.3, Abnormal findings on diagnostic imaging of other                        parts of digestive tract                       R13.10, Dysphagia, unspecified                       Q40.3, Congenital malformation of stomach, unspecified                       K22.8, Other specified diseases of esophagus CPT copyright 2017 American Medical Association. All rights reserved. The codes documented in this report are preliminary and upon coder review may  be revised to meet current compliance requirements. Efrain Sella MD, MD 05/04/2018 1:50:28 PM This report has been signed electronically. Number of Addenda: 0 Note Initiated On: 05/04/2018 1:19 PM      [facility]

## 2018-05-04 NOTE — Interval H&P Note (Signed)
History and Physical Interval Note:  05/04/2018 1:12 PM  Sherry Owens  has presented today for surgery, with the diagnosis of DYSPHAGIA ABN GI STUDY FM HX COLON CA  The various methods of treatment have been discussed with the patient and family. After consideration of risks, benefits and other options for treatment, the patient has consented to  Procedure(s): ESOPHAGOGASTRODUODENOSCOPY (EGD) WITH PROPOFOL (N/A) COLONOSCOPY WITH PROPOFOL (N/A) as a surgical intervention .  The patient's history has been reviewed, patient examined, no change in status, stable for surgery.  I have reviewed the patient's chart and labs.  Questions were answered to the patient's satisfaction.     [place], [place]

## 2018-05-04 NOTE — H&P (Signed)
Outpatient short stay form Pre-procedure 05/04/2018 1:06 PM Sherry Owens K. Alice Reichert, M.D.  Primary Physician: Paulita Cradle  Reason for visit:  Dysphagia, abnormal upper GI series, family history colon cancer  History of present illness: 77 year old female presents with intermittent dysphagia to solids and had an abnormal upper GI series with a barium tablet got stuck in the narrowed distal esophagus. Barium did go through the gastroesophageal tract without difficulty, however. Patient is a family history of colon cancer.    Current Facility-Administered Medications:  .  0.9 %  sodium chloride infusion, , Intravenous, Continuous, [place], Benay Pike, MD, Last Rate: 20 mL/hr at 05/04/18 1251, 1,000 mL at 05/04/18 1251  Medications Prior to Admission  Medication Sig Dispense Refill Last Dose  . escitalopram (LEXAPRO) 20 MG tablet Take 20 mg by mouth daily.   05/03/2018 at Unknown time  . isosorbide mononitrate (IMDUR) 30 MG 24 hr tablet Take 30 mg by mouth daily.   05/03/2018 at Unknown time  . LORazepam (ATIVAN) 0.5 MG tablet Take 0.5 mg by mouth every 8 (eight) hours.   05/03/2018 at Unknown time  . losartan (COZAAR) 50 MG tablet Take 50 mg by mouth daily.   05/04/2018 at 1030  . meloxicam (MOBIC) 15 MG tablet Take 15 mg by mouth daily.   05/03/2018 at Unknown time  . omeprazole (PRILOSEC) 20 MG capsule Take 20 mg by mouth daily.   05/03/2018 at Unknown time  . valACYclovir (VALTREX) 500 MG tablet Take 500 mg by mouth 2 (two) times daily.   05/03/2018 at Unknown time  . vitamin B-12 (CYANOCOBALAMIN) 1000 MCG tablet Take 1,000 mcg by mouth daily.   Past Month at Unknown time  . alendronate (FOSAMAX) 70 MG tablet Take 70 mg by mouth once a week. Take with a full glass of water on an empty stomach.   Not Taking at Unknown time  . cephALEXin (KEFLEX) 500 MG capsule Take 1 capsule (500 mg total) 3 (three) times daily by mouth. (Patient not taking: Reported on 05/04/2018) 30 capsule 0 Not Taking at Unknown  time  . hydrALAZINE (APRESOLINE) 25 MG tablet Take 25 mg by mouth 3 (three) times daily.   Not Taking at Unknown time     No Known Allergies   Past Medical History:  Diagnosis Date  . Anginal pain ([place])   . Anxiety   . Arthritis   . Depression   . Genital herpes simplex   . GERD (gastroesophageal reflux disease)   . Hypertension     Review of systems:   Otherwise negative.   Physical Exam  Gen: Alert, oriented. Appears stated age.  HEENT: [place]/AT. PERRLA. Lungs: CTA, no wheezes. CV: RR nl S1, S2. Abd: soft, benign, no masses. BS+ Ext: No edema. Pulses 2+    Planned procedures: proceed with EGD and colonoscopy.The patient understands the nature of the planned procedure, indications, risks, alternatives and potential complications including but not limited to bleeding, infection, perforation, damage to internal organs and possible oversedation/side effects from anesthesia. The patient agrees and gives consent to proceed.  Please refer to procedure notes for findings, recommendations and patient disposition/instructions.    Savannah Morford K. Alice Reichert, M.D. Gastroenterology 05/04/2018  1:06 PM

## 2018-05-04 NOTE — Op Note (Signed)
[facility] Gastroenterology Patient Name: Leonardo Plaia Procedure Date: 05/04/2018 1:19 PM MRN: [id number] Account #: [id number] Date of Birth: 1941-05-27 Admit Type: Outpatient Age: 77 Room: [facility] ENDO ROOM 2 Gender: Female Note Status: Finalized Procedure:            Colonoscopy Indications:          Screening in patient at increased risk: Family history                        of 1st-degree relative with colorectal cancer Providers:            Benay Pike. Alice Reichert MD, MD Referring MD:         Precious Bard, MD (Referring MD) Medicines:            Propofol per Anesthesia Complications:        No immediate complications. Procedure:            Pre-Anesthesia Assessment:                       - The risks and benefits of the procedure and the                        sedation options and risks were discussed with the                        patient. All questions were answered and informed                        consent was obtained.                       - Patient identification and proposed procedure were                        verified prior to the procedure by the nurse. The                        procedure was verified in the procedure room.                       - ASA Grade Assessment: III - A patient with severe                        systemic disease.                       - After reviewing the risks and benefits, the patient                        was deemed in satisfactory condition to undergo the                        procedure.                       After obtaining informed consent, the colonoscope was                        passed under direct vision. Throughout the procedure,  the patient's blood pressure, pulse, and oxygen                        saturations were monitored continuously. The                        Colonoscope was introduced through the anus and                        advanced to the the terminal ileum, with  identification                        of the appendiceal orifice and IC valve. The quality of                        the bowel preparation was good. Findings:      The perianal and digital rectal examinations were normal. Pertinent       negatives include normal sphincter tone and no palpable rectal lesions.      Many small-mouthed diverticula were found in the left colon. There was       no evidence of diverticular bleeding.      Two sessile polyps were found in the sigmoid colon and descending colon.       The polyps were 2 to 3 mm in size. These polyps were removed with a       jumbo cold forceps. Resection and retrieval were complete.      Non-bleeding internal hemorrhoids were found during retroflexion. The       hemorrhoids were Grade I (internal hemorrhoids that do not prolapse).      The exam was otherwise without abnormality. Impression:           - Mild diverticulosis in the left colon. There was no                        evidence of diverticular bleeding.                       - Two 2 to 3 mm polyps in the sigmoid colon and in the                        descending colon, removed with a jumbo cold forceps.                        Resected and retrieved.                       - Non-bleeding internal hemorrhoids.                       - The examination was otherwise normal. Recommendation:       - Patient has a contact number available for                        emergencies. The signs and symptoms of potential                        delayed complications were discussed with the patient.  Return to normal activities tomorrow. Written discharge                        instructions were provided to the patient.                       - Resume previous diet.                       - Continue present medications.                       - Await pathology results.                       - Repeat colonoscopy date to be determined after                        pending  pathology results are reviewed for surveillance.                       - Await pathology results from EGD, also performed                        today.                       - Reassess for clinical response to esophageal dilation                       - Return to physician assistant in 6 weeks.                       - The findings and recommendations were discussed with                        the patient and their family. Procedure Code(s):    --- Professional ---                       [phone], Colonoscopy, flexible; with biopsy, single or                        multiple Diagnosis Code(s):    --- Professional ---                       K57.30, Diverticulosis of large intestine without                        perforation or abscess without bleeding                       D12.5, Benign neoplasm of sigmoid colon                       D12.4, Benign neoplasm of descending colon                       K64.0, First degree hemorrhoids                       Z80.0, Family history of malignant neoplasm of  digestive organs CPT copyright 2017 American Medical Association. All rights reserved. The codes documented in this report are preliminary and upon coder review may  be revised to meet current compliance requirements. Efrain Sella MD, MD 05/04/2018 2:15:01 PM This report has been signed electronically. Number of Addenda: 0 Note Initiated On: 05/04/2018 1:19 PM Scope Withdrawal Time: 0 hours 10 minutes 53 seconds  Total Procedure Duration: 0 hours 15 minutes 42 seconds       [facility]

## 2018-05-04 NOTE — Anesthesia Preprocedure Evaluation (Signed)
Anesthesia Evaluation  Patient identified by MRN, date of birth, ID band Patient awake    Reviewed: Allergy & Precautions, H&P , NPO status , Patient's Chart, lab work & pertinent test results, reviewed documented beta blocker date and time   Airway Mallampati: II   Neck ROM: full    Dental  (+) Poor Dentition   Pulmonary neg pulmonary ROS,    Pulmonary exam normal        Cardiovascular Exercise Tolerance: Poor hypertension, On Medications + angina with exertion negative cardio ROS Normal cardiovascular exam Rhythm:regular Rate:Normal     Neuro/Psych PSYCHIATRIC DISORDERS Anxiety Depression negative neurological ROS  negative psych ROS   GI/Hepatic negative GI ROS, Neg liver ROS, GERD  ,  Endo/Other  negative endocrine ROS  Renal/GU negative Renal ROS  negative genitourinary   Musculoskeletal   Abdominal   Peds  Hematology negative hematology ROS (+)   Anesthesia Other Findings Past Medical History: No date: Anginal pain ([place]) No date: Anxiety No date: Arthritis No date: Depression 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 No date: GERD (gastroesophageal reflux disease) No date: Hypertension Past Surgical History: No date: ABDOMINAL HYSTERECTOMY No date: COLONOSCOPY No date: TUBAL LIGATION BMI    Body Mass Index:  28.62 kg/m     Reproductive/Obstetrics negative OB ROS                             Anesthesia Physical Anesthesia Plan  ASA: III  Anesthesia Plan: General   Post-op Pain Management:    Induction:   PONV Risk Score and Plan:   Airway Management Planned:   Additional Equipment:   Intra-op Plan:   Post-operative Plan:   Informed Consent: I have reviewed the patients History and Physical, chart, labs and discussed the procedure including the risks, benefits and alternatives for the proposed anesthesia with the patient or authorized representative who has indicated  his/her understanding and acceptance.   Dental Advisory Given  Plan Discussed with: CRNA  Anesthesia Plan Comments:         Anesthesia Quick Evaluation

## 2018-05-05 ENCOUNTER — Encounter: Payer: Self-pay | Admitting: Internal Medicine

## 2018-05-06 LAB — SURGICAL PATHOLOGY

## 2018-05-09 NOTE — Anesthesia Postprocedure Evaluation (Signed)
Anesthesia Post Note  Patient: Fanta Wimberley  Procedure(s) Performed: ESOPHAGOGASTRODUODENOSCOPY (EGD) WITH PROPOFOL (N/A ) COLONOSCOPY WITH PROPOFOL (N/A )  Patient location during evaluation: PACU Anesthesia Type: General Level of consciousness: awake and alert Pain management: pain level controlled Vital Signs Assessment: post-procedure vital signs reviewed and stable Respiratory status: spontaneous breathing, nonlabored ventilation, respiratory function stable and patient connected to nasal cannula oxygen Cardiovascular status: blood pressure returned to baseline and stable Postop Assessment: no apparent nausea or vomiting Anesthetic complications: no     Last Vitals:  Vitals:   05/04/18 1433 05/04/18 1443  BP: (!) 153/79 (!) 168/82  Pulse: (!) 51 (!) 52  Resp: 17 17  Temp:    SpO2: 96% 96%    Last Pain:  Vitals:   05/04/18 1443  TempSrc:   PainSc: 0-No pain                 Molli Barrows

## 2018-08-17 ENCOUNTER — Other Ambulatory Visit: Payer: Self-pay | Admitting: Physician Assistant

## 2018-08-17 DIAGNOSIS — R100 Acute abdomen: Secondary | ICD-10-CM

## 2018-08-17 DIAGNOSIS — R109 Unspecified abdominal pain: Secondary | ICD-10-CM

## 2018-08-22 ENCOUNTER — Ambulatory Visit
Admission: RE | Admit: 2018-08-22 | Discharge: 2018-08-22 | Disposition: A | Discharge status: Home / Self Care | Payer: Medicare Other | Source: Ambulatory Visit | Attending: Physician Assistant | Admitting: Physician Assistant

## 2018-08-22 DIAGNOSIS — N2889 Other specified disorders of kidney and ureter: Secondary | ICD-10-CM | POA: Insufficient documentation

## 2018-08-22 DIAGNOSIS — K388 Other specified diseases of appendix: Secondary | ICD-10-CM | POA: Insufficient documentation

## 2018-08-22 DIAGNOSIS — R109 Unspecified abdominal pain: Secondary | ICD-10-CM | POA: Insufficient documentation

## 2018-08-22 DIAGNOSIS — N261 Atrophy of kidney (terminal): Secondary | ICD-10-CM | POA: Insufficient documentation

## 2018-08-22 DIAGNOSIS — R634 Abnormal weight loss: Secondary | ICD-10-CM | POA: Insufficient documentation

## 2018-08-22 DIAGNOSIS — R100 Acute abdomen: Secondary | ICD-10-CM

## 2018-08-22 HISTORY — DX: Systemic involvement of connective tissue, unspecified: M35.9

## 2018-08-22 LAB — POCT I-STAT CREATININE: CREATININE: 1.2 mg/dL — AB (ref 0.44–1.00)

## 2018-08-22 MED ORDER — IOPAMIDOL (ISOVUE-300) INJECTION 61%
100.0000 mL | Freq: Once | INTRAVENOUS | Status: AC | PRN
  Administered 2018-08-22: 100 mL via INTRAVENOUS

## 2018-08-30 ENCOUNTER — Ambulatory Visit: Payer: Self-pay | Admitting: Surgery

## 2018-08-30 NOTE — H&P (View-Only) (Signed)
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 female who was referred by Sheron Nightingale,* for evaluation of above. Initial symptoms first noted several weeks ago.  Symptoms include: Pain is dull, intermittent, located in low left paraspinal region, RLQ, occasional radiation to LUQ.  Exacerbated by nothing specific.  Alleviated by nothing specific.  Associated with occasional nausea, but denies vomiting, change in BMs.  Urinalysis normal but CT ordered noted mucocele and under non-specific findings so referred to surgery.  She currently is still experiencing the pain.  Wt has been stable since March according to records.  Denies any flushing, diarrhea, night sweats, fatigue.     Past Medical History:  has a past medical history of Anxiety, Depression, and Hypertension.  Past Surgical History:  has a past surgical history that includes Replacement total knee (Right, 11/2015); Replacement total knee (Left, 12/2015); Hysterectomy Vaginal (2015); Tubal ligation; Colonoscopy (05/04/2018); and egd (05/04/2018).  Family History: family history includes Asthma in her sister and sister; COPD in her sister; Colon cancer in her father; High blood pressure (Hypertension) in her brother; Pancreatic cancer in her brother and mother.  Social History:  reports that she has never smoked. She has never used smokeless tobacco. She reports that she does not drink alcohol or use drugs.  Current Medications: has a current medication list which includes the following prescription(s): cyanocobalamin, escitalopram oxalate, isosorbide mononitrate, lorazepam, losartan, meloxicam, omeprazole, valacyclovir, metronidazole, and neomycin.  Allergies:  No Known Allergies  ROS:  A 15 point review of systems was performed and pertinent positives and negatives noted in HPI   Objective:   BP (!) 181/98   Pulse 66   Temp 36.5 C (97.7 F) (Oral)   Ht 165.1 cm (5\' 5" )   Wt  77.5 kg (170 lb 12.8 oz)   BMI 28.42 kg/m   Constitutional :  alert, appears stated age, cooperative and no distress  Lymphatics/Throat:  no asymmetry, masses, or scars  Respiratory:  clear to auscultation bilaterally  Cardiovascular:  regular rate and rhythm  Gastrointestinal: soft, no guarding, but intermittent focal tenderness on left side, and more consistent tenderness in RLQ.  palpation of left paraspinal region at lower lumbar level normal but patient had bouts of pain in the area throughout the exam.  no obviously palpable mass or lymphadenopathy..    Musculoskeletal: Steady gait and movement  Skin: Cool and moist,   Psychiatric: Normal affect, non-agitated, not confused       LABS:  n/a  RADS: CLINICAL DATA:77 year old with abdominal pain, unspecified abdominal location. 21 pound weight loss over last year.  EXAM: CT ABDOMEN AND PELVIS WITH CONTRAST  TECHNIQUE: Multidetector CT imaging of the abdomen and pelvis was performed using the standard protocol following bolus administration of intravenous contrast.  CONTRAST:14mL ISOVUE-300 IOPAMIDOL (ISOVUE-300) INJECTION 61%  COMPARISON:Upper GI 03/14/2018  FINDINGS: Lower chest: Hazy densities in the left lower lobe are most compatible with atelectasis. No pleural effusions.  Hepatobiliary: No focal liver abnormality is seen. No gallstones, gallbladder wall thickening, or biliary dilatation. No biliary dilatation.  Pancreas: Unremarkable. No pancreatic ductal dilatation or surrounding inflammatory changes.  Spleen: Normal in size without focal abnormality.  Adrenals/Urinary Tract: Adrenal glands are within normal limits. Areas of right renal cortical thinning and atrophy. Peripheral calcification in the right kidney upper pole measuring up to 7 mm. This could represent a stone and possibly associated with a caliceal diverticulum. Right extrarenal pelvis and minimal dilatation of the right  ureter.  Small left extrarenal pelvis. No suspicious left renal lesions. Urinary bladder is unremarkable.  Stomach/Bowel: The distal aspect of the appendix is markedly dilated and contains low-density material. There is calcified structure along the proximal aspect of the dilated appendix. This dilated appendix measures up to 5.9 cm. The tip of this distended appendix has mild mural nodularity. In addition, there is concern for extraluminal disease along the posterior aspect of the distal appendix on sequence 6, image 54. This extraluminal component measures up to 1.6 cm. Normal appearance of stomach and duodenum. No bowel dilatation. Normal appearance of the terminal ileum and the ileocecal valve.  Vascular/Lymphatic: Atherosclerotic disease involving the iliac arteries. No evidence for aortic aneurysm. There is no significant lymph node enlargement in the abdomen or pelvis. Main portal venous system is patent.  Reproductive: Uterus has been removed. No clear evidence for an adnexal mass but there is nonspecific soft tissue structure in left hemipelvis measuring 1.9 cm on sequence 2, image 80. Questionable ovarian / adnexal tissue in the right pelvis on sequence 2, image 77.  Other: Mild stranding in the omentum without significant nodularity. No evidence for ascites.  Musculoskeletal: The T8 vertebral body is very sclerotic but the morphology is suggestive for large vertebral body hemangioma. Diffuse osteopenia in the bones. Disc space narrowing at L5-S1.  IMPRESSION: 1. Appendiceal mucocele. Concern for an underlying neoplastic process based on the questionable extraluminal disease adjacent to the appendix mucocele and indeterminate nodular structures in the pelvis. In addition, there is mild stranding throughout the omentum which could represent underlying peritoneal disease but the omental findings are nonspecific. Recommend surgical consultation. 2. Areas of cortical thinning and  atrophy in the right kidney with a peripheral calcification or stone in the right kidney upper pole. 3. These results will be called to the ordering clinician or representative by the Radiologist Assistant, and communication documented in the PACS or zVision Dashboard.   Electronically Signed By: Herold Harms M.D. 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  Will proceed with surgery to determine cause and proceed with removal of appendix, possible removal of right colon if necessary to treat the cause of the mucocele.  The risk oflaparoscopic colon resectionsurgery includes, but not limited to, recurrence, bleeding, chronic pain, post-op infxn, post-op SBO or ileus, hernias, resection of bowel, re-anastamosis, possible ostomy placement and need for re-operation to address said risks. The risks of general anesthetic, if used, includes MI, CVA, sudden death or even reaction to anesthetic medications also discussed. Alternatives include continued observation.Benefits include possible symptom relief, preventing further decline in health and possible death.  Typical post-op recovery time of additional days in hospital for observation afterwards also discussed.  Prep ordered. Will proceed with ERAS protocol as well.   The patient and sonverbalized understanding and all questions were answered to the patient's satisfaction.

## 2018-08-30 NOTE — H&P (Signed)
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 female who was referred by Sheron Nightingale,* for evaluation of above. Initial symptoms first noted several weeks ago.  Symptoms include: Pain is dull, intermittent, located in low left paraspinal region, RLQ, occasional radiation to LUQ.  Exacerbated by nothing specific.  Alleviated by nothing specific.  Associated with occasional nausea, but denies vomiting, change in BMs.  Urinalysis normal but CT ordered noted mucocele and under non-specific findings so referred to surgery.  She currently is still experiencing the pain.  Wt has been stable since March according to records.  Denies any flushing, diarrhea, night sweats, fatigue.     Past Medical History:  has a past medical history of Anxiety, Depression, and Hypertension.  Past Surgical History:  has a past surgical history that includes Replacement total knee (Right, 11/2015); Replacement total knee (Left, 12/2015); Hysterectomy Vaginal (2015); Tubal ligation; Colonoscopy (05/04/2018); and egd (05/04/2018).  Family History: family history includes Asthma in her sister and sister; COPD in her sister; Colon cancer in her father; High blood pressure (Hypertension) in her brother; Pancreatic cancer in her brother and mother.  Social History:  reports that she has never smoked. She has never used smokeless tobacco. She reports that she does not drink alcohol or use drugs.  Current Medications: has a current medication list which includes the following prescription(s): cyanocobalamin, escitalopram oxalate, isosorbide mononitrate, lorazepam, losartan, meloxicam, omeprazole, valacyclovir, metronidazole, and neomycin.  Allergies:  No Known Allergies  ROS:  A 15 point review of systems was performed and pertinent positives and negatives noted in HPI   Objective:   BP (!) 181/98   Pulse 66   Temp 36.5 C (97.7 F) (Oral)   Ht 165.1 cm (5\' 5" )   Wt  77.5 kg (170 lb 12.8 oz)   BMI 28.42 kg/m   Constitutional :  alert, appears stated age, cooperative and no distress  Lymphatics/Throat:  no asymmetry, masses, or scars  Respiratory:  clear to auscultation bilaterally  Cardiovascular:  regular rate and rhythm  Gastrointestinal: soft, no guarding, but intermittent focal tenderness on left side, and more consistent tenderness in RLQ.  palpation of left paraspinal region at lower lumbar level normal but patient had bouts of pain in the area throughout the exam.  no obviously palpable mass or lymphadenopathy..    Musculoskeletal: Steady gait and movement  Skin: Cool and moist,   Psychiatric: Normal affect, non-agitated, not confused       LABS:  n/a  RADS: CLINICAL DATA:77 year old with abdominal pain, unspecified abdominal location. 21 pound weight loss over last year.  EXAM: CT ABDOMEN AND PELVIS WITH CONTRAST  TECHNIQUE: Multidetector CT imaging of the abdomen and pelvis was performed using the standard protocol following bolus administration of intravenous contrast.  CONTRAST:129mL ISOVUE-300 IOPAMIDOL (ISOVUE-300) INJECTION 61%  COMPARISON:Upper GI 03/14/2018  FINDINGS: Lower chest: Hazy densities in the left lower lobe are most compatible with atelectasis. No pleural effusions.  Hepatobiliary: No focal liver abnormality is seen. No gallstones, gallbladder wall thickening, or biliary dilatation. No biliary dilatation.  Pancreas: Unremarkable. No pancreatic ductal dilatation or surrounding inflammatory changes.  Spleen: Normal in size without focal abnormality.  Adrenals/Urinary Tract: Adrenal glands are within normal limits. Areas of right renal cortical thinning and atrophy. Peripheral calcification in the right kidney upper pole measuring up to 7 mm. This could represent a stone and possibly associated with a caliceal diverticulum. Right extrarenal pelvis and minimal dilatation of the right  ureter.  Small left extrarenal pelvis. No suspicious left renal lesions. Urinary bladder is unremarkable.  Stomach/Bowel: The distal aspect of the appendix is markedly dilated and contains low-density material. There is calcified structure along the proximal aspect of the dilated appendix. This dilated appendix measures up to 5.9 cm. The tip of this distended appendix has mild mural nodularity. In addition, there is concern for extraluminal disease along the posterior aspect of the distal appendix on sequence 6, image 54. This extraluminal component measures up to 1.6 cm. Normal appearance of stomach and duodenum. No bowel dilatation. Normal appearance of the terminal ileum and the ileocecal valve.  Vascular/Lymphatic: Atherosclerotic disease involving the iliac arteries. No evidence for aortic aneurysm. There is no significant lymph node enlargement in the abdomen or pelvis. Main portal venous system is patent.  Reproductive: Uterus has been removed. No clear evidence for an adnexal mass but there is nonspecific soft tissue structure in left hemipelvis measuring 1.9 cm on sequence 2, image 80. Questionable ovarian / adnexal tissue in the right pelvis on sequence 2, image 77.  Other: Mild stranding in the omentum without significant nodularity. No evidence for ascites.  Musculoskeletal: The T8 vertebral body is very sclerotic but the morphology is suggestive for large vertebral body hemangioma. Diffuse osteopenia in the bones. Disc space narrowing at L5-S1.  IMPRESSION: 1. Appendiceal mucocele. Concern for an underlying neoplastic process based on the questionable extraluminal disease adjacent to the appendix mucocele and indeterminate nodular structures in the pelvis. In addition, there is mild stranding throughout the omentum which could represent underlying peritoneal disease but the omental findings are nonspecific. Recommend surgical consultation. 2. Areas of cortical thinning and  atrophy in the right kidney with a peripheral calcification or stone in the right kidney upper pole. 3. These results will be called to the ordering clinician or representative by the Radiologist Assistant, and communication documented in the PACS or zVision Dashboard.   Electronically Signed By: Herold Harms M.D. 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  Will proceed with surgery to determine cause and proceed with removal of appendix, possible removal of right colon if necessary to treat the cause of the mucocele.  The risk oflaparoscopic colon resectionsurgery includes, but not limited to, recurrence, bleeding, chronic pain, post-op infxn, post-op SBO or ileus, hernias, resection of bowel, re-anastamosis, possible ostomy placement and need for re-operation to address said risks. The risks of general anesthetic, if used, includes MI, CVA, sudden death or even reaction to anesthetic medications also discussed. Alternatives include continued observation.Benefits include possible symptom relief, preventing further decline in health and possible death.  Typical post-op recovery time of additional days in hospital for observation afterwards also discussed.  Prep ordered. Will proceed with ERAS protocol as well.   The patient and sonverbalized understanding and all questions were answered to the patient's satisfaction.

## 2018-09-01 ENCOUNTER — Other Ambulatory Visit: Payer: Self-pay

## 2018-09-01 ENCOUNTER — Encounter
Admission: RE | Admit: 2018-09-01 | Discharge: 2018-09-01 | Disposition: A | Discharge status: Home / Self Care | Payer: Medicare Other | Source: Ambulatory Visit | Attending: Surgery | Admitting: Surgery

## 2018-09-01 DIAGNOSIS — I10 Essential (primary) hypertension: Secondary | ICD-10-CM

## 2018-09-01 DIAGNOSIS — R001 Bradycardia, unspecified: Secondary | ICD-10-CM | POA: Diagnosis not present

## 2018-09-01 DIAGNOSIS — Z01810 Encounter for preprocedural cardiovascular examination: Secondary | ICD-10-CM | POA: Insufficient documentation

## 2018-09-01 NOTE — Patient Instructions (Signed)
Your procedure is scheduled on: Wednesday, October 2ND 2019  Report to [facility]    DO NOT STOP ON THE FIRST FLOOR TO REGISTER  To find out your arrival time please call [phone] between 1PM - 3PM on Tuesday, October 1ST  Remember: Instructions that are not followed completely may result in serious medical risk,  up to and including death, or upon the discretion of your surgeon and anesthesiologist your  surgery may need to be rescheduled.     _X__ 1. Do not eat food after midnight the night before your procedure.                 No gum chewing or hard candies.                   ABSOLUTELY NOTHING IN YOUR MOUTH AFTER MIDNIGHT                  You may drink clear liquids up to 2 hours before you are scheduled to arrive for your surgery-                  DO not drink clear liquids within 2 hours of the start of your surgery.                  Clear Liquids include:  water, apple juice without pulp, clear carbohydrate                 drink such as Clearfast of Gatorade, Black Coffee or Tea (Do not add                 anything to coffee or tea).  __X__2.  On the morning of surgery brush your teeth with toothpaste and water,                    You may rinse your mouth with mouthwash if you wish.                      Do not swallow any toothpaste of mouthwash.     _X__ 3.  No Alcohol for 24 hours before or after surgery.   _X__ 4.  Do Not Smoke or use e-cigarettes For 24 Hours Prior to Your Surgery.                 Do not use any chewable tobacco products for at least 6 hours prior to                 surgery.  ____  5.  Bring all medications with you on the day of surgery if instructed.   ____  6.  Notify your doctor if there is any change in your medical condition      (cold, fever, infections).     Do not wear jewelry, make-up, hairpins, clips or nail polish. Do not wear lotions, powders, or perfumes. You may wear  deodorant. Do not shave 48 hours prior to surgery. Men may shave face and neck. Do not bring valuables to the hospital.    [facility] is not responsible for any belongings or valuables.  Contacts, dentures or bridgework may not be worn into surgery. Leave your suitcase in the car. After surgery it may be brought to your room. For patients admitted to the hospital, discharge time is determined by your treatment team.   Patients discharged the day of surgery will  not be allowed to drive home.   Please read over the following fact sheets that you were given:   PREPARING FOR SURGERY                 ADVANCE DIRECTIVES   _X___ Take these medicines the morning of surgery with A SIP OF WATER:    1. LEXAPRO  2. IMDUR  3. OMEPRAZOLE, TAKE AN EXTRA DOSE AT BEDTIME THE NIGHT BEFORE SURGERY  4. VALTREX  5. LORAZEPAM  6.  __X__ Fleet Enema (as directed) OBSERVE ANY BOWEL PREP ORDERED BY DR. Lysle Pearl   __X__ Use CHG Soap as directed  ____ Use inhalers on the day of surgery   _X__ Stop ALL ASPIRIN  PRODUCTS AS OF TODAY  __X__ Stop ALL ANTIINFLAMMATORIES AS OF TODAY              THIS INCLUDES IBUPROFEN / MOTRIN / ADVIL / ALEVE / NAPROXYN / MELOXICAM                YOU MAY TAKE TYLENOL AT ANY TIME PRIOR TO SURGERY   ____ Stop supplements until after surgery.    ____ Bring C-Pap to the hospital.   TAKE LOSARTAN EVERY DAY AS USUAL BUT DO NOT TAKE ON THE MORNING OF SURGERY.     HAVE A FIRM PILLOW AT HOME FOR HOLDING AGAINST YOUR BELLY  STOOL SOFTENERS NEEDED FOR HOME USE.  [place] TO BE COMFORTABLE ON YOUR BELLY AFTER SURGERY

## 2018-09-01 NOTE — Pre-Procedure Instructions (Signed)
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 She does not have a blood card to bring with her on day of surgery but will sign a refusal of blood products form.

## 2018-09-06 MED ORDER — METRONIDAZOLE IN NACL 5-0.79 MG/ML-% IV SOLN
500.0000 mg | INTRAVENOUS | Status: AC
  Administered 2018-09-07: 500 mg via INTRAVENOUS
  Filled 2018-09-06: qty 100

## 2018-09-06 MED ORDER — SODIUM CHLORIDE 0.9 % IV SOLN
2.0000 g | INTRAVENOUS | Status: AC
  Administered 2018-09-07: 2 g via INTRAVENOUS
  Filled 2018-09-06: qty 2

## 2018-09-07 ENCOUNTER — Encounter: Payer: Self-pay | Admitting: *Deleted

## 2018-09-07 ENCOUNTER — Other Ambulatory Visit: Payer: Self-pay

## 2018-09-07 ENCOUNTER — Encounter
Admission: RE | Disposition: A | Discharge status: Home / Self Care | Payer: Self-pay | Source: Home / Self Care | Attending: Surgery

## 2018-09-07 ENCOUNTER — Inpatient Hospital Stay: Payer: Medicare Other | Admitting: Certified Registered Nurse Anesthetist

## 2018-09-07 ENCOUNTER — Inpatient Hospital Stay
Admission: RE | Admit: 2018-09-07 | Discharge: 2018-09-12 | DRG: 330 | Disposition: A | Discharge status: Home / Self Care | Payer: Medicare Other | Attending: Surgery | Admitting: Surgery

## 2018-09-07 DIAGNOSIS — E669 Obesity, unspecified: Secondary | ICD-10-CM | POA: Diagnosis present

## 2018-09-07 DIAGNOSIS — I10 Essential (primary) hypertension: Secondary | ICD-10-CM | POA: Diagnosis present

## 2018-09-07 DIAGNOSIS — Z96653 Presence of artificial knee joint, bilateral: Secondary | ICD-10-CM | POA: Diagnosis present

## 2018-09-07 DIAGNOSIS — Z90710 Acquired absence of both cervix and uterus: Secondary | ICD-10-CM

## 2018-09-07 DIAGNOSIS — Z8249 Family history of ischemic heart disease and other diseases of the circulatory system: Secondary | ICD-10-CM

## 2018-09-07 DIAGNOSIS — Z6830 Body mass index (BMI) 30.0-30.9, adult: Secondary | ICD-10-CM | POA: Diagnosis not present

## 2018-09-07 DIAGNOSIS — D62 Acute posthemorrhagic anemia: Secondary | ICD-10-CM | POA: Diagnosis not present

## 2018-09-07 DIAGNOSIS — F329 Major depressive disorder, single episode, unspecified: Secondary | ICD-10-CM | POA: Diagnosis present

## 2018-09-07 DIAGNOSIS — K921 Melena: Secondary | ICD-10-CM | POA: Diagnosis not present

## 2018-09-07 DIAGNOSIS — K388 Other specified diseases of appendix: Principal | ICD-10-CM | POA: Diagnosis present

## 2018-09-07 DIAGNOSIS — F419 Anxiety disorder, unspecified: Secondary | ICD-10-CM | POA: Diagnosis present

## 2018-09-07 DIAGNOSIS — R1031 Right lower quadrant pain: Secondary | ICD-10-CM | POA: Diagnosis present

## 2018-09-07 HISTORY — PX: LAPAROSCOPIC RIGHT HEMI COLECTOMY: SHX5926

## 2018-09-07 LAB — CBC
HCT: 37.6 % (ref 35.0–47.0)
Hemoglobin: 13.1 g/dL (ref 12.0–16.0)
MCH: 34.8 pg — ABNORMAL HIGH (ref 26.0–34.0)
MCHC: 34.7 g/dL (ref 32.0–36.0)
MCV: 100.4 fL — AB (ref 80.0–100.0)
PLATELETS: 143 10*3/uL — AB (ref 150–440)
RBC: 3.75 MIL/uL — AB (ref 3.80–5.20)
RDW: 13.3 % (ref 11.5–14.5)
WBC: 7.4 10*3/uL (ref 3.6–11.0)

## 2018-09-07 LAB — CREATININE, SERUM
Creatinine, Ser: 1.07 mg/dL — ABNORMAL HIGH (ref 0.44–1.00)
GFR, EST AFRICAN AMERICAN: 57 mL/min — AB (ref >60)
GFR, EST NON AFRICAN AMERICAN: 49 mL/min — AB (ref >60)

## 2018-09-07 SURGERY — LAPAROSCOPIC RIGHT HEMI COLECTOMY
Anesthesia: General | Laterality: Right

## 2018-09-07 MED ORDER — FAMOTIDINE 20 MG PO TABS
20.0000 mg | ORAL_TABLET | Freq: Once | ORAL | Status: AC
  Administered 2018-09-07: 20 mg via ORAL

## 2018-09-07 MED ORDER — MORPHINE SULFATE (PF) 2 MG/ML IV SOLN: 2.0000 mg | INTRAVENOUS | Status: DC | PRN

## 2018-09-07 MED ORDER — DEXAMETHASONE SODIUM PHOSPHATE 10 MG/ML IJ SOLN
INTRAMUSCULAR | Status: DC | PRN
  Administered 2018-09-07: 8 mg via INTRAVENOUS

## 2018-09-07 MED ORDER — FENTANYL CITRATE (PF) 100 MCG/2ML IJ SOLN
INTRAMUSCULAR | Status: AC
  Filled 2018-09-07: qty 2

## 2018-09-07 MED ORDER — LIDOCAINE-EPINEPHRINE (PF) 1 %-1:200000 IJ SOLN
INTRAMUSCULAR | Status: AC
  Filled 2018-09-07: qty 30

## 2018-09-07 MED ORDER — VITAMIN B-12 1000 MCG PO TABS
1000.0000 ug | ORAL_TABLET | Freq: Every day | ORAL | Status: DC
  Administered 2018-09-08 – 2018-09-12 (×5): 1000 ug via ORAL
  Filled 2018-09-07 (×5): qty 1

## 2018-09-07 MED ORDER — PROMETHAZINE HCL 25 MG/ML IJ SOLN: 6.2500 mg | INTRAMUSCULAR | Status: DC | PRN

## 2018-09-07 MED ORDER — OXYCODONE HCL 5 MG PO TABS: 5.0000 mg | ORAL_TABLET | Freq: Once | ORAL | Status: DC | PRN

## 2018-09-07 MED ORDER — ACETAMINOPHEN 10 MG/ML IV SOLN
INTRAVENOUS | Status: AC
  Filled 2018-09-07: qty 100

## 2018-09-07 MED ORDER — LIDOCAINE HCL (PF) 2 % IJ SOLN
INTRAMUSCULAR | Status: AC
  Filled 2018-09-07: qty 10

## 2018-09-07 MED ORDER — ISOSORBIDE MONONITRATE ER 30 MG PO TB24
30.0000 mg | ORAL_TABLET | Freq: Every day | ORAL | Status: DC
  Administered 2018-09-08 – 2018-09-12 (×5): 30 mg via ORAL
  Filled 2018-09-07 (×5): qty 1

## 2018-09-07 MED ORDER — ACETAMINOPHEN 325 MG PO TABS
650.0000 mg | ORAL_TABLET | Freq: Four times a day (QID) | ORAL | Status: DC | PRN
  Filled 2018-09-07: qty 2

## 2018-09-07 MED ORDER — GABAPENTIN 300 MG PO CAPS
300.0000 mg | ORAL_CAPSULE | ORAL | Status: AC
  Administered 2018-09-07: 300 mg via ORAL

## 2018-09-07 MED ORDER — PROPOFOL 10 MG/ML IV BOLUS
INTRAVENOUS | Status: AC
  Filled 2018-09-07: qty 20

## 2018-09-07 MED ORDER — DEXAMETHASONE SODIUM PHOSPHATE 10 MG/ML IJ SOLN
INTRAMUSCULAR | Status: AC
  Filled 2018-09-07: qty 1

## 2018-09-07 MED ORDER — ACETAMINOPHEN 10 MG/ML IV SOLN
INTRAVENOUS | Status: DC | PRN
  Administered 2018-09-07: 1000 mg via INTRAVENOUS

## 2018-09-07 MED ORDER — LOSARTAN POTASSIUM 50 MG PO TABS
50.0000 mg | ORAL_TABLET | Freq: Every day | ORAL | Status: DC
  Administered 2018-09-08 – 2018-09-12 (×5): 50 mg via ORAL
  Filled 2018-09-07 (×5): qty 1

## 2018-09-07 MED ORDER — CELECOXIB 200 MG PO CAPS
200.0000 mg | ORAL_CAPSULE | ORAL | Status: AC
  Administered 2018-09-07: 200 mg via ORAL

## 2018-09-07 MED ORDER — LORAZEPAM 0.5 MG PO TABS
0.5000 mg | ORAL_TABLET | Freq: Three times a day (TID) | ORAL | Status: DC
  Administered 2018-09-08 – 2018-09-10 (×7): 0.5 mg via ORAL
  Filled 2018-09-07 (×7): qty 1

## 2018-09-07 MED ORDER — LACTATED RINGERS IV SOLN: INTRAVENOUS | Status: DC

## 2018-09-07 MED ORDER — HYDRALAZINE HCL 25 MG PO TABS
25.0000 mg | ORAL_TABLET | Freq: Three times a day (TID) | ORAL | Status: DC
  Administered 2018-09-08 – 2018-09-12 (×13): 25 mg via ORAL
  Filled 2018-09-07 (×13): qty 1

## 2018-09-07 MED ORDER — ROCURONIUM BROMIDE 50 MG/5ML IV SOLN
INTRAVENOUS | Status: AC
  Filled 2018-09-07: qty 1

## 2018-09-07 MED ORDER — SUGAMMADEX SODIUM 200 MG/2ML IV SOLN
INTRAVENOUS | Status: AC
  Filled 2018-09-07: qty 2

## 2018-09-07 MED ORDER — CELECOXIB 200 MG PO CAPS
ORAL_CAPSULE | ORAL | Status: AC
  Administered 2018-09-07: 200 mg via ORAL
  Filled 2018-09-07: qty 1

## 2018-09-07 MED ORDER — ALENDRONATE SODIUM 70 MG PO TABS: 70.0000 mg | ORAL_TABLET | ORAL | Status: DC

## 2018-09-07 MED ORDER — BUPIVACAINE HCL (PF) 0.5 % IJ SOLN
INTRAMUSCULAR | Status: AC
  Filled 2018-09-07: qty 30

## 2018-09-07 MED ORDER — CHLORHEXIDINE GLUCONATE CLOTH 2 % EX PADS: 6.0000 | MEDICATED_PAD | Freq: Once | CUTANEOUS | Status: DC

## 2018-09-07 MED ORDER — MEPERIDINE HCL 50 MG/ML IJ SOLN: 6.2500 mg | INTRAMUSCULAR | Status: DC | PRN

## 2018-09-07 MED ORDER — ENOXAPARIN SODIUM 40 MG/0.4ML ~~LOC~~ SOLN
40.0000 mg | SUBCUTANEOUS | Status: DC
  Administered 2018-09-08: 40 mg via SUBCUTANEOUS
  Filled 2018-09-07 (×2): qty 0.4

## 2018-09-07 MED ORDER — FAMOTIDINE 20 MG PO TABS
ORAL_TABLET | ORAL | Status: AC
  Administered 2018-09-07: 20 mg via ORAL
  Filled 2018-09-07: qty 1

## 2018-09-07 MED ORDER — TRAMADOL HCL 50 MG PO TABS: 50.0000 mg | ORAL_TABLET | Freq: Four times a day (QID) | ORAL | Status: DC | PRN

## 2018-09-07 MED ORDER — SODIUM CHLORIDE 0.9 % IV SOLN
INTRAVENOUS | Status: DC
  Administered 2018-09-07 (×3): via INTRAVENOUS

## 2018-09-07 MED ORDER — PROPOFOL 10 MG/ML IV BOLUS
INTRAVENOUS | Status: DC | PRN
  Administered 2018-09-07: 130 mg via INTRAVENOUS

## 2018-09-07 MED ORDER — OXYCODONE HCL 5 MG/5ML PO SOLN: 5.0000 mg | Freq: Once | ORAL | Status: DC | PRN

## 2018-09-07 MED ORDER — ESCITALOPRAM OXALATE 10 MG PO TABS
20.0000 mg | ORAL_TABLET | Freq: Every day | ORAL | Status: DC
  Administered 2018-09-08 – 2018-09-12 (×4): 20 mg via ORAL
  Filled 2018-09-07 (×5): qty 2

## 2018-09-07 MED ORDER — ONDANSETRON HCL 4 MG PO TABS: 4.0000 mg | ORAL_TABLET | Freq: Four times a day (QID) | ORAL | Status: DC | PRN

## 2018-09-07 MED ORDER — ONDANSETRON HCL 4 MG/2ML IJ SOLN: 4.0000 mg | Freq: Four times a day (QID) | INTRAMUSCULAR | Status: DC | PRN

## 2018-09-07 MED ORDER — ENSURE SURGERY PO LIQD
237.0000 mL | Freq: Two times a day (BID) | ORAL | Status: DC
  Administered 2018-09-08 – 2018-09-12 (×6): 237 mL via ORAL
  Filled 2018-09-07 (×10): qty 237

## 2018-09-07 MED ORDER — FENTANYL CITRATE (PF) 100 MCG/2ML IJ SOLN
INTRAMUSCULAR | Status: DC | PRN
  Administered 2018-09-07 (×2): 50 ug via INTRAVENOUS

## 2018-09-07 MED ORDER — SUGAMMADEX SODIUM 200 MG/2ML IV SOLN
INTRAVENOUS | Status: DC | PRN
  Administered 2018-09-07: 155.6 mg via INTRAVENOUS

## 2018-09-07 MED ORDER — BUPIVACAINE LIPOSOME 1.3 % IJ SUSP
INTRAMUSCULAR | Status: AC
  Filled 2018-09-07: qty 20

## 2018-09-07 MED ORDER — LIDOCAINE HCL (CARDIAC) PF 100 MG/5ML IV SOSY
PREFILLED_SYRINGE | INTRAVENOUS | Status: DC | PRN
  Administered 2018-09-07: 100 mg via INTRAVENOUS

## 2018-09-07 MED ORDER — VALACYCLOVIR HCL 500 MG PO TABS
500.0000 mg | ORAL_TABLET | Freq: Every day | ORAL | Status: DC
  Administered 2018-09-08 – 2018-09-12 (×5): 500 mg via ORAL
  Filled 2018-09-07 (×5): qty 1

## 2018-09-07 MED ORDER — BUPIVACAINE LIPOSOME 1.3 % IJ SUSP
INTRAMUSCULAR | Status: DC | PRN
  Administered 2018-09-07: 40 mL

## 2018-09-07 MED ORDER — ONDANSETRON HCL 4 MG/2ML IJ SOLN
INTRAMUSCULAR | Status: AC
  Filled 2018-09-07: qty 2

## 2018-09-07 MED ORDER — EPHEDRINE SULFATE 50 MG/ML IJ SOLN
INTRAMUSCULAR | Status: DC | PRN
  Administered 2018-09-07: 10 mg via INTRAVENOUS

## 2018-09-07 MED ORDER — ACETAMINOPHEN 500 MG PO TABS
ORAL_TABLET | ORAL | Status: AC
  Administered 2018-09-07: 1000 mg via ORAL
  Filled 2018-09-07: qty 2

## 2018-09-07 MED ORDER — HYDROCODONE-ACETAMINOPHEN 5-325 MG PO TABS
1.0000 | ORAL_TABLET | Freq: Four times a day (QID) | ORAL | Status: DC | PRN
  Administered 2018-09-08: 1 via ORAL
  Filled 2018-09-07 (×2): qty 1

## 2018-09-07 MED ORDER — ACETAMINOPHEN 500 MG PO TABS
1000.0000 mg | ORAL_TABLET | ORAL | Status: AC
  Administered 2018-09-07: 1000 mg via ORAL

## 2018-09-07 MED ORDER — GABAPENTIN 300 MG PO CAPS
ORAL_CAPSULE | ORAL | Status: AC
  Administered 2018-09-07: 300 mg via ORAL
  Filled 2018-09-07: qty 1

## 2018-09-07 MED ORDER — SUCCINYLCHOLINE CHLORIDE 20 MG/ML IJ SOLN
INTRAMUSCULAR | Status: DC | PRN
  Administered 2018-09-07: 100 mg via INTRAVENOUS

## 2018-09-07 MED ORDER — FENTANYL CITRATE (PF) 100 MCG/2ML IJ SOLN: 25.0000 ug | INTRAMUSCULAR | Status: DC | PRN

## 2018-09-07 MED ORDER — ROCURONIUM BROMIDE 100 MG/10ML IV SOLN
INTRAVENOUS | Status: DC | PRN
  Administered 2018-09-07: 10 mg via INTRAVENOUS
  Administered 2018-09-07: 5 mg via INTRAVENOUS
  Administered 2018-09-07: 45 mg via INTRAVENOUS

## 2018-09-07 MED ORDER — ONDANSETRON HCL 4 MG/2ML IJ SOLN
INTRAMUSCULAR | Status: DC | PRN
  Administered 2018-09-07: 4 mg via INTRAVENOUS

## 2018-09-07 MED ORDER — PANTOPRAZOLE SODIUM 40 MG PO TBEC
40.0000 mg | DELAYED_RELEASE_TABLET | Freq: Every day | ORAL | Status: DC
  Administered 2018-09-07 – 2018-09-12 (×6): 40 mg via ORAL
  Filled 2018-09-07 (×6): qty 1

## 2018-09-07 SURGICAL SUPPLY — 64 items
BLADE SURG SZ10 CARB STEEL (BLADE) ×3 IMPLANT
CANISTER SUCT 1200ML W/VALVE (MISCELLANEOUS) ×3 IMPLANT
DECANTER SPIKE VIAL GLASS SM (MISCELLANEOUS) ×3 IMPLANT
DEFOGGER SCOPE WARMER CLEARIFY (MISCELLANEOUS) ×3 IMPLANT
DERMABOND ADVANCED (GAUZE/BANDAGES/DRESSINGS) ×4
DERMABOND ADVANCED .7 DNX12 (GAUZE/BANDAGES/DRESSINGS) ×2 IMPLANT
DRSG OPSITE POSTOP 4X8 (GAUZE/BANDAGES/DRESSINGS) ×3 IMPLANT
DRSG TEGADERM 2-3/8X2-3/4 SM (GAUZE/BANDAGES/DRESSINGS) ×6 IMPLANT
ELECT CAUTERY BLADE 6.4 (BLADE) ×3 IMPLANT
ELECT REM PT RETURN 9FT ADLT (ELECTROSURGICAL) ×3
ELECTRODE REM PT RTRN 9FT ADLT (ELECTROSURGICAL) ×1 IMPLANT
GAUZE SPONGE 4X4 12PLY STRL (GAUZE/BANDAGES/DRESSINGS) ×3 IMPLANT
GLOVE BIO SURGEON STRL SZ 6.5 (GLOVE) ×10 IMPLANT
GLOVE BIO SURGEONS STRL SZ 6.5 (GLOVE) ×5
GLOVE BIOGEL PI IND STRL 7.0 (GLOVE) ×1 IMPLANT
GLOVE BIOGEL PI INDICATOR 7.0 (GLOVE) ×2
GLOVE SURG SYN 7.0 (GLOVE) ×3 IMPLANT
GOWN STRL REUS W/ TWL LRG LVL3 (GOWN DISPOSABLE) ×4 IMPLANT
GOWN STRL REUS W/TWL LRG LVL3 (GOWN DISPOSABLE) ×8
HANDLE SUCTION POOLE (INSTRUMENTS) ×1 IMPLANT
HANDLE YANKAUER SUCT BULB TIP (MISCELLANEOUS) ×3 IMPLANT
HOLDER FOLEY CATH W/STRAP (MISCELLANEOUS) ×3 IMPLANT
IRRIGATION STRYKERFLOW (MISCELLANEOUS) ×1 IMPLANT
IRRIGATOR STRYKERFLOW (MISCELLANEOUS) ×3
NEEDLE HYPO 22GX1.5 SAFETY (NEEDLE) ×3 IMPLANT
NS IRRIG 1000ML POUR BTL (IV SOLUTION) ×3 IMPLANT
PACK COLON CLEAN CLOSURE (MISCELLANEOUS) ×3 IMPLANT
PACK LAP CHOLECYSTECTOMY (MISCELLANEOUS) ×3 IMPLANT
PENCIL ELECTRO HAND CTR (MISCELLANEOUS) ×3 IMPLANT
RELOAD PROXIMATE 75MM BLUE (ENDOMECHANICALS) ×3 IMPLANT
RELOAD PROXIMATE TA60MM BLUE (ENDOMECHANICALS) IMPLANT
RETRACTOR WOUND ALXS 18CM MED (MISCELLANEOUS) ×1 IMPLANT
RTRCTR WOUND ALEXIS O 18CM MED (MISCELLANEOUS) ×3
SCISSORS METZENBAUM CVD 33 (INSTRUMENTS) IMPLANT
SHEARS HARMONIC ACE PLUS 36CM (ENDOMECHANICALS) ×3 IMPLANT
SLEEVE ENDOPATH XCEL 5M (ENDOMECHANICALS) IMPLANT
SPONGE LAP 18X18 RF (DISPOSABLE) ×6 IMPLANT
SPONGE LAP 18X36 RFD (DISPOSABLE) ×3 IMPLANT
STAPLER GUN LINEAR PROX 60 (STAPLE) ×3 IMPLANT
STAPLER PROXIMATE 75MM BLUE (STAPLE) ×3 IMPLANT
STAPLER SKIN PROX 35W (STAPLE) ×3 IMPLANT
SUCTION POOLE HANDLE (INSTRUMENTS) ×3
SURGILUBE 2OZ TUBE FLIPTOP (MISCELLANEOUS) ×3 IMPLANT
SUT MNCRL 4-0 (SUTURE) ×4
SUT MNCRL 4-0 27XMFL (SUTURE) ×2
SUT PDS AB 0 CT1 27 (SUTURE) ×6 IMPLANT
SUT PDS AB 1 TP1 96 (SUTURE) ×3 IMPLANT
SUT SILK 2 0 (SUTURE) ×2
SUT SILK 2 0 SH CR/8 (SUTURE) ×3 IMPLANT
SUT SILK 2-0 30XBRD TIE 12 (SUTURE) ×1 IMPLANT
SUT SILK 3-0 (SUTURE) ×6 IMPLANT
SUT VIC AB 3-0 SH 27 (SUTURE) ×2
SUT VIC AB 3-0 SH 27X BRD (SUTURE) ×1 IMPLANT
SUT VICRYL 3-0 CR8 SH (SUTURE) ×3 IMPLANT
SUTURE MNCRL 4-0 27XMF (SUTURE) ×2 IMPLANT
SYR 30ML LL (SYRINGE) ×3 IMPLANT
SYS LAPSCP GELPORT 120MM (MISCELLANEOUS) ×3
SYSTEM LAPSCP GELPORT 120MM (MISCELLANEOUS) ×1 IMPLANT
TOWEL OR 17X26 4PK STRL BLUE (TOWEL DISPOSABLE) ×6 IMPLANT
TRAY FOLEY SLVR 16FR LF STAT (SET/KITS/TRAYS/PACK) ×3 IMPLANT
TROCAR XCEL NON-BLD 5MMX100MML (ENDOMECHANICALS) ×3 IMPLANT
TUBING CONNECTING 10 (TUBING) ×2 IMPLANT
TUBING CONNECTING 10' (TUBING) ×1
TUBING INSUF HEATED (TUBING) ×6 IMPLANT

## 2018-09-07 NOTE — Anesthesia Postprocedure Evaluation (Signed)
Anesthesia Post Note  Patient: Sherry Owens  Procedure(s) Performed: LAPAROSCOPIC RIGHT HEMI COLECTOMY (Right )  Patient location during evaluation: PACU Anesthesia Type: General Level of consciousness: awake and alert Pain management: pain level controlled Vital Signs Assessment: post-procedure vital signs reviewed and stable Respiratory status: spontaneous breathing, nonlabored ventilation, respiratory function stable and patient connected to nasal cannula oxygen Cardiovascular status: blood pressure returned to baseline and stable Postop Assessment: no apparent nausea or vomiting Anesthetic complications: no     Last Vitals:  Vitals:   09/07/18 1733 09/07/18 1744  BP: 122/70   Pulse: 60 (!) 58  Resp: 14 12  Temp:    SpO2: 96% 95%    Last Pain:  Vitals:   09/07/18 1733  TempSrc:   PainSc: Asleep                 Precious Haws Ashaki Frosch

## 2018-09-07 NOTE — Anesthesia Procedure Notes (Signed)
Procedure Name: Intubation Performed by: Demetrius Charity, CRNA Pre-anesthesia Checklist: Patient identified, Patient being monitored, Timeout performed, Emergency Drugs available and Suction available Patient Re-evaluated:Patient Re-evaluated prior to induction Oxygen Delivery Method: Circle system utilized Preoxygenation: Pre-oxygenation with 100% oxygen Induction Type: IV induction Ventilation: Mask ventilation without difficulty Laryngoscope Size: Mac and 3 Grade View: Grade I Tube type: Oral Tube size: 7.0 mm Number of attempts: 1 Airway Equipment and Method: Stylet Placement Confirmation: ETT inserted through vocal cords under direct vision,  positive ETCO2 and breath sounds checked- equal and bilateral Secured at: 21 cm Tube secured with: Tape Dental Injury: Teeth and Oropharynx as per pre-operative assessment

## 2018-09-07 NOTE — Brief Op Note (Signed)
09/07/2018  4:08 PM  PATIENT:  Sherry Owens  77 y.o. female  PRE-OPERATIVE DIAGNOSIS:  mucocele of the appendix  POST-OPERATIVE DIAGNOSIS:  mucocele of the appendix  PROCEDURE:  Procedure(s): LAPAROSCOPIC RIGHT HEMI COLECTOMY (Right)  SURGEON:  Surgeon(s) and Role:    Lysle Pearl, Caprice Wasko, DO - Primary  PHYSICIAN ASSISTANT: Cintron-Diaz  ASSISTANTS: none   ANESTHESIA:   general  EBL:  50    BLOOD ADMINISTERED:none  DRAINS: none   LOCAL MEDICATIONS USED:  OTHER exparel  SPECIMEN:  Source of Specimen:  right colon with appendiceal mucocele  DISPOSITION OF SPECIMEN:  PATHOLOGY  COUNTS:  YES  TOURNIQUET:  * No tourniquets in log *  DICTATION: .Note written in EPIC  PLAN OF CARE: Admit to inpatient   PATIENT DISPOSITION:  PACU - hemodynamically stable.   Delay start of Pharmacological VTE agent (>24hrs) due to surgical blood loss or risk of bleeding: not applicable

## 2018-09-07 NOTE — Anesthesia Preprocedure Evaluation (Signed)
Anesthesia Evaluation  Patient identified by MRN, date of birth, ID band Patient awake    Reviewed: Allergy & Precautions, NPO status , Patient's Chart, lab work & pertinent test results  History of Anesthesia Complications Negative for: history of anesthetic complications  Airway Mallampati: II  TM Distance: >3 FB Neck ROM: Full    Dental  (+) Upper Dentures   Pulmonary neg sleep apnea, neg COPD,    breath sounds clear to auscultation- rhonchi (-) wheezing      Cardiovascular Exercise Tolerance: Good hypertension, Pt. on medications (-) CAD, (-) Past MI, (-) Cardiac Stents and (-) CABG  Rhythm:Regular Rate:Normal - Systolic murmurs and - Diastolic murmurs    Neuro/Psych PSYCHIATRIC DISORDERS Anxiety Depression negative neurological ROS     GI/Hepatic Neg liver ROS, GERD  ,  Endo/Other  negative endocrine ROSneg diabetes  Renal/GU negative Renal ROS     Musculoskeletal  (+) Arthritis ,   Abdominal (+) - obese,   Peds  Hematology negative hematology ROS (+)   Anesthesia Other Findings Past Medical History: 2019: Anginal pain (HCC)     Comment:  currently has pain w/ stress No date: Anxiety No date: Arthritis No date: Collagen vascular disease (HCC)     Comment:  RA No date: Depression 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 No date: GERD (gastroesophageal reflux disease) No date: Hypertension   Reproductive/Obstetrics                             Anesthesia Physical Anesthesia Plan  ASA: II  Anesthesia Plan: General   Post-op Pain Management:    Induction: Intravenous  PONV Risk Score and Plan: 2 and Ondansetron, Dexamethasone and Midazolam  Airway Management Planned: Oral ETT  Additional Equipment:   Intra-op Plan:   Post-operative Plan: Extubation in OR  Informed Consent: I have reviewed the patients History and Physical, chart, labs and discussed the procedure including  the risks, benefits and alternatives for the proposed anesthesia with the patient or authorized representative who has indicated his/her understanding and acceptance.   Dental advisory given  Plan Discussed with: CRNA and Anesthesiologist  Anesthesia Plan Comments:         Anesthesia Quick Evaluation

## 2018-09-07 NOTE — Anesthesia Post-op Follow-up Note (Signed)
Anesthesia QCDR form completed.        

## 2018-09-07 NOTE — Interval H&P Note (Signed)
History and Physical Interval Note:  09/07/2018 12:03 PM  Sherry Owens  has presented today for surgery, with the diagnosis of mococele of the appendix  The various methods of treatment have been discussed with the patient and family. After consideration of risks, benefits and other options for treatment, the patient has consented to  Procedure(s): LAPAROSCOPIC RIGHT HEMI COLECTOMY (Right) as a surgical intervention .  The patient's history has been reviewed, patient examined, no change in status, stable for surgery.  I have reviewed the patient's chart and labs.  Questions were answered to the patient's satisfaction.     Sherry Owens

## 2018-09-07 NOTE — Transfer of Care (Signed)
Immediate Anesthesia Transfer of Care Note  Patient: Sherry Owens  Procedure(s) Performed: LAPAROSCOPIC RIGHT HEMI COLECTOMY (Right )  Patient Location: PACU  Anesthesia Type:General  Level of Consciousness: sedated  Airway & Oxygen Therapy: Patient Spontanous Breathing and Patient connected to face mask oxygen  Post-op Assessment: Report given to RN and Post -op Vital signs reviewed and stable  Post vital signs: Reviewed and stable  Last Vitals:  Vitals Value Taken Time  BP 124/63 09/07/2018  4:03 PM  Temp    Pulse 56 09/07/2018  4:05 PM  Resp    SpO2 95 % 09/07/2018  4:05 PM  Vitals shown include unvalidated device data.  Last Pain:  Vitals:   09/07/18 1107  TempSrc: Temporal  PainSc: 0-No pain         Complications: No apparent anesthesia complications

## 2018-09-07 NOTE — Progress Notes (Signed)

## 2018-09-08 ENCOUNTER — Encounter: Payer: Self-pay | Admitting: Surgery

## 2018-09-08 LAB — BASIC METABOLIC PANEL
Anion gap: 6 (ref 5–15)
BUN: 22 mg/dL (ref 8–23)
CHLORIDE: 109 mmol/L (ref 98–111)
CO2: 23 mmol/L (ref 22–32)
Calcium: 8.8 mg/dL — ABNORMAL LOW (ref 8.9–10.3)
Creatinine, Ser: 1.05 mg/dL — ABNORMAL HIGH (ref 0.44–1.00)
GFR calc Af Amer: 58 mL/min — ABNORMAL LOW (ref >60)
GFR calc non Af Amer: 50 mL/min — ABNORMAL LOW (ref >60)
GLUCOSE: 123 mg/dL — AB (ref 70–99)
POTASSIUM: 4.4 mmol/L (ref 3.5–5.1)
SODIUM: 138 mmol/L (ref 135–145)

## 2018-09-08 LAB — CBC
HEMATOCRIT: 36.8 % (ref 35.0–47.0)
Hemoglobin: 12.6 g/dL (ref 12.0–16.0)
MCH: 35.1 pg — AB (ref 26.0–34.0)
MCHC: 34.2 g/dL (ref 32.0–36.0)
MCV: 102.7 fL — AB (ref 80.0–100.0)
Platelets: 133 10*3/uL — ABNORMAL LOW (ref 150–440)
RBC: 3.58 MIL/uL — ABNORMAL LOW (ref 3.80–5.20)
RDW: 13.1 % (ref 11.5–14.5)
WBC: 7.9 10*3/uL (ref 3.6–11.0)

## 2018-09-08 LAB — MAGNESIUM: Magnesium: 2 mg/dL (ref 1.7–2.4)

## 2018-09-08 LAB — PHOSPHORUS: Phosphorus: 3.6 mg/dL (ref 2.5–4.6)

## 2018-09-08 NOTE — Progress Notes (Signed)
Initial Nutrition Assessment  DOCUMENTATION CODES:   Not applicable  INTERVENTION:   Ensure surgery supplement BID, each supplement provides 330kcal and 18g of protein   NUTRITION DIAGNOSIS:   Increased nutrient needs related to post-op healing as evidenced by increased estimated needs.  GOAL:   Patient will meet greater than or equal to 90% of their needs  MONITOR:   PO intake, Supplement acceptance, Labs, Weight trends, Skin, I & O's  REASON FOR ASSESSMENT:   Malnutrition Screening Tool    ASSESSMENT:   77 y/o female s/p scheduled R hemicolectomy secondary to appendiceal mucocele on 10/2  Visited pt's room today. Pt sleeping at time of RD visit; RD did not disturb pt. Pt did not appear malnourished. Pt documented to be eating 100% of her clear liquid diet and is drinking Ensure surgery. No BM noted yet. Per chart, pt appears weight stable pta.    Medications reviewed and include: lovenox, protonix, B12, NaCl @50ml /hr  Labs reviewed: creat 1.05(H), K 4.4 wnl, P 3.6 wnl, Mg 2.0 wnl MCV 10.27(H), MCH 35.1(H)  Diet Order:   Diet Order            Diet clear liquid Room service appropriate? Yes; Fluid consistency: Thin  Diet effective now             EDUCATION NEEDS:   No education needs have been identified at this time  Skin:  Skin Assessment: Reviewed RN Assessment(Incision abdomen )  Last BM:  pta  Height:   Ht Readings from Last 1 Encounters:  09/07/18 5\' 5"  (1.651 m)    Weight:   Wt Readings from Last 1 Encounters:  09/08/18 81.4 kg    Ideal Body Weight:  56.8 kg  BMI:  Body mass index is 29.86 kg/m.  Estimated Nutritional Needs:   Kcal:  1600-1800kcal/day   Protein:  81-98g/day   Fluid:  >1.6L/day   Koleen Distance MS, RD, LDN Pager #- [phone] Office#- [phone] After Hours Pager: [phone]

## 2018-09-08 NOTE — Op Note (Signed)
Preoperative diagnosis: Appendiceal mucocele Postoperative diagnosis: same  Procedure: Laparoscopic right extended hemicolectomy with primary anastomosis.   Anesthesia: GETA  Surgeon: Lysle Pearl Assistant: Cintron-Diaz  Wound Classification: Clean Contaminated  Indications: Patient is a 77 y.o. female with appendiceal mucocele noted on CT scan, possible early malignant.  Procedure recommended for pathologic diagnosis as well as potential curative procedure.  Specimen: Right Colon  Complications: None  Estimated Blood Loss:  75 mL  Findings: 1.  Large appendiceal mucocele at the midportion of the appendix  2. Tension free anastomosis 3. Adequate gross circulation on anastomosis 4. No gross metastatic disease, abnormal lymph nodes identified 5. Adequate hemostasis  Details of operation: The patient was brought to the operating room and placed on the operating table in the supine position. The abdomen was clipped, prepped with 2% chlorhexidine solution, and draped in the standard fashion. Foley placed.  A time-out was completed verifying correct patient, procedure, site, positioning, and implant(s) and/or special equipment prior to beginning this procedure.   Varies needle was introduced at Palmer's point and insufflation achieved to 58mmHg after a positive saline drop test.  There is needle then removed and 5 mm trocar placed in the same area using Optiview technique.  Inspection of the area afterwards indicated no injury during insertion of needle and trocar.  Gross inspection of the abdominal cavity did not know any carcinomatosis within the visible omentum and lower abdominal wall.  No visible liver lesions were identified at this point either.    A vertical 5 cm periumbilical incision was made and a Gelport was placed.  Under direct visualization a 49mm RUQ trocar was introduced.  The patient was then positioned in 30 reverse Trendelenburg position with the patient's right side up to  allow for small bowel to drop clear of the operative field. The 5-mm scope was introduced from the epigastric port at the initial part of the procedure.  The appendix was identified at this point and noted to have a large cystlike structure within the middle portion of it, with extensive dilation of the appendix distal and proximal to it until right before attaching to the base of the cecum, where approximately a centimeter worth of normal caliber appendiceal lumen was noted.  No obvious extraluminal pathology or additional pathology noted surrounding the appendix at this point.  Due to the size of the cyst/mass within the appendix, decision was made to proceed with a extended right hemicolectomy at this point.   With a hand assisted technique, the transverse colon was grasped with the right hand of the operating surgeon and lifted up to tent the mesentary and omentum.  The lateral attachments around the hepatic flexure down along the white line of toldt was taken down using the harmonic via the laparaoscopic technique.  Once the right colon was mobilized and pulled through the hand port, a hole was made in the mesentary slightly distal to the middle colic artery.  Transverse colon then divided using GIA vascular load.   The mesentary was then divided using harmonic.  Care was taken to not injure the duodenum during this portion.   Dissection then continued along the ascending colon.  The ileum was divided at least 96VE from the ileocolic valve and using a GIA device. The mesenteric dissection was completed as needed on the small bowel and colonic sides and the specimen resection was thus completed. The specimen was removed. The two ends of the ileum and transverse colon were then aligned, ensuring that the mesentery was not  twisted, and a stapled end-to-end anastomosis was created in the usual manner using 79mm stapler.  The new staple line anastamosis was lembert sutured. The bowel reintroduced into the  peritoneal cavity.  No bleeding or injury noted with one last inspection laparoscopically before the hand port and trocars removed.  Gloves and instruments were switched out and the fascia was closed with running PDS 0 x2.   Exparel infused to incisions prior to closing.  3-0 vicryl used to close subcutaneous layer at the hand port incision prior to closing all skin incisions with stapler.  Wounds then dressed with honeycomb sponge, 2x2, and tegarderm.   The sponge and instrument count were correct.  The patient tolerated the procedure well and was transferred to the post-anesthesia care unit in stable condition.  Foley removed prior to transfer.

## 2018-09-08 NOTE — Progress Notes (Signed)
Subjective:  CC:  Sherry Owens is a 77 y.o. 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  Tolerated clears, had a BM already.  ROS:  A 5 point review of systems was performed and pertinent positives and negatives noted in HPI.   Objective:      Temp:  [97.8 F (36.6 C)-98.3 F (36.8 C)] 98.2 F (36.8 C) (10/03 1927) Pulse Rate:  [59-73] 67 (10/03 1927) Resp:  [16-18] 18 (10/03 1927) BP: (151-180)/(83-97) 151/86 (10/03 2251) SpO2:  [95 %-99 %] 97 % (10/03 1927) Weight:  [81.4 kg] 81.4 kg (10/03 0500)     Height: 5\' 5"  (165.1 cm) Weight: 81.4 kg BMI (Calculated): 29.86   Intake/Output this shift:  Total I/O In: -  Out: 250 [Urine:250]       Constitutional :  alert, cooperative, appears stated age and no distress  Respiratory:  clear to auscultation bilaterally  Cardiovascular:  regular rate and rhythm  Gastrointestinal: soft, non-tender; bowel sounds normal; no masses,  no organomegaly and inicisions c/d/i.Marland Kitchen   Skin: Cool and moist.   Psychiatric: Normal affect, non-agitated, not confused       LABS:  CMP Latest Ref Rng & Units 09/08/2018 09/07/2018 08/22/2018  Glucose 70 - 99 mg/dL 123(H) - -  BUN 8 - 23 mg/dL 22 - -  Creatinine 0.44 - 1.00 mg/dL 1.05(H) 1.07(H) 1.20(H)  Sodium 135 - 145 mmol/L 138 - -  Potassium 3.5 - 5.1 mmol/L 4.4 - -  Chloride 98 - 111 mmol/L 109 - -  CO2 22 - 32 mmol/L 23 - -  Calcium 8.9 - 10.3 mg/dL 8.8(L) - -  Total Protein 6.5 - 8.1 g/dL - - -  Total Bilirubin 0.3 - 1.2 mg/dL - - -  Alkaline Phos 38 - 126 U/L - - -  AST 15 - 41 U/L - - -  ALT 14 - 54 U/L - - -   CBC Latest Ref Rng & Units 09/08/2018 09/07/2018 03/24/2018  WBC 3.6 - 11.0 K/uL 7.9 7.4 5.7  Hemoglobin 12.0 - 16.0 g/dL 12.6 13.1 13.1  Hematocrit 35.0 - 47.0 % 36.8 37.6 39.0  Platelets 150 - 440 K/uL 133(L) 143(L) 142(L)    RADS: n/a Assessment:   Hospital stay day 1, 1  Day Post-Op lap, 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  Advance to soft diet.  Possible discharge tomorrow

## 2018-09-09 LAB — CBC WITH DIFFERENTIAL/PLATELET
Basophils Absolute: 0.1 10*3/uL (ref 0–0.1)
Basophils Absolute: 0 10*3/uL (ref 0–0.1)
Basophils Relative: 1 %
Basophils Relative: 1 %
Eosinophils Absolute: 0 10*3/uL (ref 0–0.7)
Eosinophils Absolute: 0.1 10*3/uL (ref 0–0.7)
Eosinophils Relative: 1 %
Eosinophils Relative: 1 %
HCT: 34.4 % — ABNORMAL LOW (ref 35.0–47.0)
HCT: 35 % (ref 35.0–47.0)
Hemoglobin: 11.9 g/dL — ABNORMAL LOW (ref 12.0–16.0)
Hemoglobin: 11.5 g/dL — ABNORMAL LOW (ref 12.0–16.0)
Lymphocytes Relative: 25 %
Lymphocytes Relative: 26 %
Lymphs Abs: 1.9 10*3/uL (ref 1.0–3.6)
Lymphs Abs: 1.8 10*3/uL (ref 1.0–3.6)
MCH: 35.5 pg — ABNORMAL HIGH (ref 26.0–34.0)
MCH: 33.4 pg (ref 26.0–34.0)
MCHC: 34.5 g/dL (ref 32.0–36.0)
MCHC: 32.7 g/dL (ref 32.0–36.0)
MCV: 102.7 fL — ABNORMAL HIGH (ref 80.0–100.0)
MCV: 102.1 fL — ABNORMAL HIGH (ref 80.0–100.0)
Monocytes Absolute: 0.7 10*3/uL (ref 0.2–0.9)
Monocytes Absolute: 0.6 10*3/uL (ref 0.2–0.9)
Monocytes Relative: 9 %
Monocytes Relative: 8 %
Neutro Abs: 4.8 10*3/uL (ref 1.4–6.5)
Neutro Abs: 4.6 10*3/uL (ref 1.4–6.5)
Neutrophils Relative %: 64 %
Neutrophils Relative %: 64 %
Platelets: 118 10*3/uL — ABNORMAL LOW (ref 150–440)
Platelets: 119 10*3/uL — ABNORMAL LOW (ref 150–440)
RBC: 3.35 MIL/uL — ABNORMAL LOW (ref 3.80–5.20)
RBC: 3.43 MIL/uL — ABNORMAL LOW (ref 3.80–5.20)
RDW: 13.4 % (ref 11.5–14.5)
RDW: 13.5 % (ref 11.5–14.5)
WBC: 7.5 10*3/uL (ref 3.6–11.0)
WBC: 7.1 10*3/uL (ref 3.6–11.0)

## 2018-09-09 MED ORDER — SODIUM CHLORIDE 0.9 % IV SOLN
INTRAVENOUS | Status: DC
  Administered 2018-09-09 – 2018-09-11 (×4): via INTRAVENOUS

## 2018-09-09 NOTE — Care Management Important Message (Signed)
Copy of signed IM left with patient in room.  

## 2018-09-09 NOTE — Progress Notes (Signed)
Patient had her second bloody BM while Dr Lysle Pearl was rounding on her.  Diet changed to NPO except sips with meds

## 2018-09-09 NOTE — Progress Notes (Signed)
Subjective:  CC:  Sherry Owens is a 77 y.o. 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  Asymptomatic otherwise.   ROS:  A 5 point review of systems was performed and pertinent positives and negatives noted in HPI.   Objective:      Temp:  [98 F (36.7 C)-98.5 F (36.9 C)] 98 F (36.7 C) (10/04 1337) Pulse Rate:  [63-67] 63 (10/04 1337) Resp:  [18-20] 20 (10/04 1337) BP: (124-176)/(71-93) 124/83 (10/04 1337) SpO2:  [92 %-97 %] 94 % (10/04 1337) Weight:  [83.4 kg] 83.4 kg (10/04 0500)     Height: 5\' 5"  (165.1 cm) Weight: 83.4 kg BMI (Calculated): 30.6   Intake/Output this shift:  Total I/O In: 360 [P.O.:360] Out: -        Constitutional :  alert, cooperative, appears stated age and no distress  Respiratory:  clear to auscultation bilaterally  Cardiovascular:  regular rate and rhythm  Gastrointestinal: soft, non-tender; bowel sounds normal; no masses,  no organomegaly and inicisions c/d/i.Marland Kitchen   Skin: Cool and moist.   Psychiatric: Normal affect, non-agitated, not confused       LABS:  CMP Latest Ref Rng & Units 09/08/2018 09/07/2018 08/22/2018  Glucose 70 - 99 mg/dL 123(H) - -  BUN 8 - 23 mg/dL 22 - -  Creatinine 0.44 - 1.00 mg/dL 1.05(H) 1.07(H) 1.20(H)  Sodium 135 - 145 mmol/L 138 - -  Potassium 3.5 - 5.1 mmol/L 4.4 - -  Chloride 98 - 111 mmol/L 109 - -  CO2 22 - 32 mmol/L 23 - -  Calcium 8.9 - 10.3 mg/dL 8.8(L) - -  Total Protein 6.5 - 8.1 g/dL - - -  Total Bilirubin 0.3 - 1.2 mg/dL - - -  Alkaline Phos 38 - 126 U/L - - -  AST 15 - 41 U/L - - -  ALT 14 - 54 U/L - - -   CBC Latest Ref Rng & Units 09/09/2018 09/08/2018 09/07/2018  WBC 3.6 - 11.0 K/uL 7.5 7.9 7.4  Hemoglobin 12.0 - 16.0 g/dL 11.9(L) 12.6 13.1  Hematocrit 35.0 - 47.0 % 34.4(L) 36.8 37.6  Platelets 150 - 440 K/uL 118(L) 133(L) 143(L)     RADS: n/a Assessment:   Hospital stay day 1, 1 Day Post-Op lap, 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  Now with melena, hgb drop to 11.9 from 13.1 over two days.  Will continue to monitor for bleed, repeat check this pm

## 2018-09-09 NOTE — Progress Notes (Signed)
Order received for NS at 81ml/hr

## 2018-09-09 NOTE — Progress Notes (Signed)
Patient had a bloody stool while using the bathroom.  Dr Lysle Pearl notified.  He is ordered blood work and has requested that am lovenox be held

## 2018-09-10 LAB — BASIC METABOLIC PANEL
ANION GAP: 8 (ref 5–15)
BUN: 18 mg/dL (ref 8–23)
CO2: 26 mmol/L (ref 22–32)
Calcium: 8.6 mg/dL — ABNORMAL LOW (ref 8.9–10.3)
Chloride: 102 mmol/L (ref 98–111)
Creatinine, Ser: 1.05 mg/dL — ABNORMAL HIGH (ref 0.44–1.00)
GFR, EST AFRICAN AMERICAN: 58 mL/min — AB (ref >60)
GFR, EST NON AFRICAN AMERICAN: 50 mL/min — AB (ref >60)
Glucose, Bld: 97 mg/dL (ref 70–99)
POTASSIUM: 4.2 mmol/L (ref 3.5–5.1)
SODIUM: 136 mmol/L (ref 135–145)

## 2018-09-10 LAB — CBC WITH DIFFERENTIAL/PLATELET
BASOS ABS: 0.1 10*3/uL (ref 0–0.1)
BASOS PCT: 1 %
EOS PCT: 0 %
Eosinophils Absolute: 0 10*3/uL (ref 0–0.7)
HCT: 37.3 % (ref 35.0–47.0)
Hemoglobin: 12.3 g/dL (ref 12.0–16.0)
LYMPHS PCT: 19 %
Lymphs Abs: 1.6 10*3/uL (ref 1.0–3.6)
MCH: 33.5 pg (ref 26.0–34.0)
MCHC: 33.1 g/dL (ref 32.0–36.0)
MCV: 101.1 fL — AB (ref 80.0–100.0)
Monocytes Absolute: 0.8 10*3/uL (ref 0.2–0.9)
Monocytes Relative: 10 %
Neutro Abs: 6 10*3/uL (ref 1.4–6.5)
Neutrophils Relative %: 70 %
PLATELETS: 139 10*3/uL — AB (ref 150–440)
RBC: 3.69 MIL/uL — AB (ref 3.80–5.20)
RDW: 13.1 % (ref 11.5–14.5)
WBC: 8.4 10*3/uL (ref 3.6–11.0)

## 2018-09-10 LAB — MAGNESIUM: MAGNESIUM: 1.7 mg/dL (ref 1.7–2.4)

## 2018-09-10 LAB — PHOSPHORUS: Phosphorus: 2.8 mg/dL (ref 2.5–4.6)

## 2018-09-10 MED ORDER — LORAZEPAM 0.5 MG PO TABS
0.5000 mg | ORAL_TABLET | Freq: Every day | ORAL | Status: DC
  Administered 2018-09-10 – 2018-09-12 (×2): 0.5 mg via ORAL
  Filled 2018-09-10 (×2): qty 1

## 2018-09-10 NOTE — Progress Notes (Signed)
CC: s/p Right colectomy POD # 3 Subjective: Feeling better, huingry, + flatus Had some melena but resolved. H/H stable.  Was somnolent  Objective: Vital signs in last 24 hours: Temp:  [98.6 F (37 C)-100.6 F (38.1 C)] 99.1 F (37.3 C) (10/05 1036) Pulse Rate:  [68-94] 78 (10/05 1036) Resp:  [16] 16 (10/05 1036) BP: (147-180)/(82-95) 147/95 (10/05 1036) SpO2:  [93 %-95 %] 94 % (10/05 1036) Weight:  [77.8 kg] 77.8 kg (10/05 0500) Last BM Date: 09/10/18  Intake/Output from previous day: 10/04 0701 - 10/05 0700 In: 904.8 [P.O.:360; I.V.:544.8] Out: 2100 [Urine:2100] Intake/Output this shift: Total I/O In: 781.4 [I.V.:781.4] Out: -   Physical exam: NAD, awake and alert Abd: soft, incision c/d/i, no infection or peritonitis  Lab Results: CBC  Recent Labs    09/09/18 1717 09/10/18 0839  WBC 7.1 8.4  HGB 11.5* 12.3  HCT 35.0 37.3  PLT 119* 139*   BMET Recent Labs    09/08/18 0528 09/10/18 0839  NA 138 136  K 4.4 4.2  CL 109 102  CO2 23 26  GLUCOSE 123* 97  BUN 22 18  CREATININE 1.05* 1.05*  CALCIUM 8.8* 8.6*   PT/INR No results for input(s): LABPROT, INR in the last 72 hours. ABG No results for input(s): PHART, HCO3 in the last 72 hours.  Invalid input(s): PCO2, PO2  Studies/Results: No results found.  Anti-infectives: Anti-infectives (From admission, onward)   Start     Dose/Rate Route Frequency Ordered Stop   09/08/18 1000  valACYclovir (VALTREX) tablet 500 mg     500 mg Oral Daily 09/07/18 1840     09/07/18 0600  cefTRIAXone (ROCEPHIN) 2 g in sodium chloride 0.9 % 100 mL IVPB     2 g 200 mL/hr over 30 Minutes Intravenous On call to O.R. 09/06/18 2209 09/07/18 1352   09/07/18 0600  metroNIDAZOLE (FLAGYL) IVPB 500 mg     500 mg 100 mL/hr over 60 Minutes Intravenous On call to O.R. 09/06/18 2209 09/07/18 1342      Assessment/Plan:  Doing well Clears Resolution of GI bleed Hold ativan mobilize  Caroleen Hamman, MD,  FACS  09/10/2018

## 2018-09-10 NOTE — Progress Notes (Signed)
Patient was groggy this am and having difficulty staying awake.  I reviewed her meds and she said she takes ativan once daily.  Her home med list said every 8 hours and ativan was ordered that way.  Dr Dahlia Byes notified and ativan changed to daily and order given to hold am lexapro

## 2018-09-11 LAB — CBC WITH DIFFERENTIAL/PLATELET
BASOS PCT: 1 %
Basophils Absolute: 0 10*3/uL (ref 0–0.1)
Eosinophils Absolute: 0.1 10*3/uL (ref 0–0.7)
Eosinophils Relative: 2 %
HEMATOCRIT: 35.5 % (ref 35.0–47.0)
HEMOGLOBIN: 11.6 g/dL — AB (ref 12.0–16.0)
LYMPHS ABS: 2.1 10*3/uL (ref 1.0–3.6)
LYMPHS PCT: 29 %
MCH: 33 pg (ref 26.0–34.0)
MCHC: 32.8 g/dL (ref 32.0–36.0)
MCV: 100.5 fL — AB (ref 80.0–100.0)
MONO ABS: 0.7 10*3/uL (ref 0.2–0.9)
MONOS PCT: 10 %
NEUTROS ABS: 4.1 10*3/uL (ref 1.4–6.5)
NEUTROS PCT: 58 %
Platelets: 132 10*3/uL — ABNORMAL LOW (ref 150–440)
RBC: 3.53 MIL/uL — ABNORMAL LOW (ref 3.80–5.20)
RDW: 13.3 % (ref 11.5–14.5)
WBC: 7 10*3/uL (ref 3.6–11.0)

## 2018-09-11 LAB — COMPREHENSIVE METABOLIC PANEL
ALK PHOS: 58 U/L (ref 38–126)
ALT: 15 U/L (ref 0–44)
ANION GAP: 4 — AB (ref 5–15)
AST: 22 U/L (ref 15–41)
Albumin: 3.1 g/dL — ABNORMAL LOW (ref 3.5–5.0)
BILIRUBIN TOTAL: 1.1 mg/dL (ref 0.3–1.2)
BUN: 16 mg/dL (ref 8–23)
CALCIUM: 8.6 mg/dL — AB (ref 8.9–10.3)
CO2: 25 mmol/L (ref 22–32)
Chloride: 110 mmol/L (ref 98–111)
Creatinine, Ser: 1.04 mg/dL — ABNORMAL HIGH (ref 0.44–1.00)
GFR calc non Af Amer: 50 mL/min — ABNORMAL LOW (ref >60)
GFR, EST AFRICAN AMERICAN: 59 mL/min — AB (ref >60)
Glucose, Bld: 96 mg/dL (ref 70–99)
POTASSIUM: 3.9 mmol/L (ref 3.5–5.1)
SODIUM: 139 mmol/L (ref 135–145)
TOTAL PROTEIN: 6.1 g/dL — AB (ref 6.5–8.1)

## 2018-09-11 LAB — PHOSPHORUS: PHOSPHORUS: 2.7 mg/dL (ref 2.5–4.6)

## 2018-09-11 LAB — MAGNESIUM: Magnesium: 1.8 mg/dL (ref 1.7–2.4)

## 2018-09-11 NOTE — Plan of Care (Signed)
Patient ambulated around nurses station with 1 assist, tolerated well.

## 2018-09-11 NOTE — Progress Notes (Signed)
POD # 4 Doing well Taking liquids + gas Debilitated Hb stable No pain  PE NAD Abd: soft, incision healing well, no infection or peritoniits  A/P doing well Regular diet mobilize heplock ivf Dc in am

## 2018-09-12 ENCOUNTER — Other Ambulatory Visit: Payer: Self-pay | Admitting: Surgery

## 2018-09-12 LAB — CBC WITH DIFFERENTIAL/PLATELET
BASOS PCT: 1 %
Basophils Absolute: 0 10*3/uL (ref 0–0.1)
EOS ABS: 0.2 10*3/uL (ref 0–0.7)
Eosinophils Relative: 2 %
HEMATOCRIT: 33.6 % — AB (ref 35.0–47.0)
HEMOGLOBIN: 11.3 g/dL — AB (ref 12.0–16.0)
Lymphocytes Relative: 25 %
Lymphs Abs: 1.6 10*3/uL (ref 1.0–3.6)
MCH: 33.9 pg (ref 26.0–34.0)
MCHC: 33.5 g/dL (ref 32.0–36.0)
MCV: 101.3 fL — ABNORMAL HIGH (ref 80.0–100.0)
MONOS PCT: 11 %
Monocytes Absolute: 0.7 10*3/uL (ref 0.2–0.9)
NEUTROS ABS: 4.1 10*3/uL (ref 1.4–6.5)
Neutrophils Relative %: 61 %
Platelets: 147 10*3/uL — ABNORMAL LOW (ref 150–440)
RBC: 3.32 MIL/uL — AB (ref 3.80–5.20)
RDW: 13.5 % (ref 11.5–14.5)
WBC: 6.7 10*3/uL (ref 3.6–11.0)

## 2018-09-12 LAB — PHOSPHORUS: PHOSPHORUS: 3.3 mg/dL (ref 2.5–4.6)

## 2018-09-12 LAB — MAGNESIUM: MAGNESIUM: 1.8 mg/dL (ref 1.7–2.4)

## 2018-09-12 LAB — SURGICAL PATHOLOGY

## 2018-09-12 MED ORDER — HYDROCODONE-ACETAMINOPHEN 5-325 MG PO TABS: 1.0000 | ORAL_TABLET | Freq: Four times a day (QID) | ORAL | 0 refills | Status: DC | PRN

## 2018-09-12 MED ORDER — DOCUSATE SODIUM 100 MG PO CAPS: 100.0000 mg | ORAL_CAPSULE | Freq: Two times a day (BID) | ORAL | 0 refills | Status: AC | PRN

## 2018-09-12 MED ORDER — ACETAMINOPHEN 325 MG PO TABS: 650.0000 mg | ORAL_TABLET | Freq: Four times a day (QID) | ORAL | 0 refills | Status: AC | PRN

## 2018-09-12 NOTE — Discharge Instructions (Signed)
-   tylenol as needed for discomfort.   - Use narcotics, if prescribed, only when tylenol is not enough to control pain. - 325-650mg  every 8hrs to max of 4000mg /24hrs (including the 325mg  in every norco dose) for the tylenol.

## 2018-09-12 NOTE — Discharge Summary (Signed)
Physician Discharge Summary  Patient ID: Sherry Owens MRN: [id number] DOB/AGE: [birth year] 77 y.o.  Admit date: 09/07/2018 Discharge date: 09/12/2018  Admission Diagnoses: mucocele of appendix  Discharge Diagnoses:  Same as above, acute blood loss anemia, melena, hematochizia  Discharged Condition: good  Hospital Course: s/p elective laparoscopic hand-assisted extended right hemicolectomy, postop she did have several episodes of melena and hematochezia for which she was monitored through serial hemoglobin checks and n.p.o. status per day.  Once the melena hematochezia has resolved and hemoglobin is stabilized she is slowly resumed on a diet.  No further episodes of GI bleed or lab abnormalities noted therefore the patient was deemed appropriate for discharge after tolerating soft diet and having normal bowel movements with pain control with oral pain meds.  Consults: None  Discharge Exam: Blood pressure 134/68, pulse 75, temperature 98.7 F (37.1 C), temperature source Oral, resp. rate 20, height 5\' 5"  (1.651 m), weight 77.6 kg, SpO2 94 %. General appearance: alert, cooperative and no distress GI: soft, non-tender; bowel sounds normal; no masses,  no organomegaly and staple line clean, intact  Disposition:  Discharge disposition: 01-Home or Self Care       Discharge Instructions    Discharge patient   Complete by:  As directed    Discharge disposition:  01-Home or Self Care   Discharge patient date:  09/12/2018     Allergies as of 09/12/2018      Reactions   Lactose Intolerance (gi) Diarrhea   Stomach upset and diarrhea      Medication List    STOP taking these medications   cephALEXin 500 MG capsule Commonly known as:  KEFLEX     TAKE these medications   acetaminophen 325 MG tablet Commonly known as:  TYLENOL Take 2 tablets (650 mg total) by mouth every 6 (six) hours as needed for mild pain.   alendronate 70 MG tablet Commonly known as:  FOSAMAX Take 70 mg by  mouth once a week. Take with a full glass of water on an empty stomach.   docusate sodium 100 MG capsule Commonly known as:  COLACE Take 1 capsule (100 mg total) by mouth 2 (two) times daily as needed for up to 10 days for mild constipation.   escitalopram 20 MG tablet Commonly known as:  LEXAPRO Take 20 mg by mouth daily.   hydrALAZINE 25 MG tablet Commonly known as:  APRESOLINE Take 25 mg by mouth 3 (three) times daily.   HYDROcodone-acetaminophen 5-325 MG tablet Commonly known as:  NORCO/VICODIN Take 1 tablet by mouth every 6 (six) hours as needed for moderate pain.   isosorbide mononitrate 30 MG 24 hr tablet Commonly known as:  IMDUR Take 30 mg by mouth daily.   LORazepam 0.5 MG tablet Commonly known as:  ATIVAN Take 0.5 mg by mouth daily at 8 pm.   losartan 50 MG tablet Commonly known as:  COZAAR Take 50 mg by mouth daily.   meloxicam 15 MG tablet Commonly known as:  MOBIC Take 15 mg by mouth daily.   omeprazole 20 MG capsule Commonly known as:  PRILOSEC Take 20 mg by mouth daily.   phenylephrine 10 MG Tabs tablet Commonly known as:  SUDAFED PE Take 10 mg by mouth every 4 (four) hours as needed (for sinus drainage).   valACYclovir 500 MG tablet Commonly known as:  VALTREX Take 500 mg by mouth daily.   vitamin B-12 1000 MCG tablet Commonly known as:  CYANOCOBALAMIN Take 1,000 mcg by mouth daily.  Follow-up Information    Lysle Pearl, Cherrill Scrima, DO Follow up in 2 day(s).   Specialty:  Surgery Why:  for staple removal Contact information: [address] [place] [address] [phone]            Total time spent arranging discharge was >46min. Signed: Benjamine Sprague 09/12/2018, 9:25 AM

## 2018-09-12 NOTE — Progress Notes (Signed)
09/12/2018 12:34 PM  Sherry Owens Sherry Owens to be D/C'd Home per MD order.  Discussed prescriptions and follow up appointments with the patient. Prescriptions given to patient, medication list explained in detail. Pt verbalized understanding.  Allergies as of 09/12/2018      Reactions   Lactose Intolerance (gi) Diarrhea   Stomach upset and diarrhea      Medication List    STOP taking these medications   cephALEXin 500 MG capsule Commonly known as:  KEFLEX     TAKE these medications   acetaminophen 325 MG tablet Commonly known as:  TYLENOL Take 2 tablets (650 mg total) by mouth every 6 (six) hours as needed for mild pain.   alendronate 70 MG tablet Commonly known as:  FOSAMAX Take 70 mg by mouth once a week. Take with a full glass of water on an empty stomach.   docusate sodium 100 MG capsule Commonly known as:  COLACE Take 1 capsule (100 mg total) by mouth 2 (two) times daily as needed for up to 10 days for mild constipation.   escitalopram 20 MG tablet Commonly known as:  LEXAPRO Take 20 mg by mouth daily.   hydrALAZINE 25 MG tablet Commonly known as:  APRESOLINE Take 25 mg by mouth 3 (three) times daily.   HYDROcodone-acetaminophen 5-325 MG tablet Commonly known as:  NORCO/VICODIN Take 1 tablet by mouth every 6 (six) hours as needed for moderate pain.   isosorbide mononitrate 30 MG 24 hr tablet Commonly known as:  IMDUR Take 30 mg by mouth daily.   LORazepam 0.5 MG tablet Commonly known as:  ATIVAN Take 0.5 mg by mouth daily at 8 pm.   losartan 50 MG tablet Commonly known as:  COZAAR Take 50 mg by mouth daily.   meloxicam 15 MG tablet Commonly known as:  MOBIC Take 15 mg by mouth daily.   omeprazole 20 MG capsule Commonly known as:  PRILOSEC Take 20 mg by mouth daily.   phenylephrine 10 MG Tabs tablet Commonly known as:  SUDAFED PE Take 10 mg by mouth every 4 (four) hours as needed (for sinus drainage).   valACYclovir 500 MG tablet Commonly known as:   VALTREX Take 500 mg by mouth daily.   vitamin B-12 1000 MCG tablet Commonly known as:  CYANOCOBALAMIN Take 1,000 mcg by mouth daily.       Vitals:   09/12/18 0931 09/12/18 1127  BP: 138/84 132/78  Pulse: 70 80  Resp: 19 17  Temp:  97.7 F (36.5 C)  SpO2: 96% 98%    Skin clean, dry and intact without evidence of skin break down, no evidence of skin tears noted. IV catheter discontinued intact. Site without signs and symptoms of complications. Dressing and pressure applied. Pt denies pain at this time. No complaints noted.  An After Visit Summary was printed and given to the patient. Patient escorted via [place], and D/C home via private auto.  Dola Argyle

## 2018-09-12 NOTE — Care Management Important Message (Signed)
Copy of signed IM left with patient in room.  

## 2018-11-17 ENCOUNTER — Other Ambulatory Visit: Payer: Self-pay | Admitting: Physician Assistant

## 2018-11-17 DIAGNOSIS — Z1231 Encounter for screening mammogram for malignant neoplasm of breast: Secondary | ICD-10-CM

## 2018-12-16 ENCOUNTER — Ambulatory Visit
Admission: RE | Admit: 2018-12-16 | Discharge: 2018-12-16 | Disposition: A | Discharge status: Home / Self Care | Payer: Medicare Other | Source: Ambulatory Visit | Attending: Physician Assistant | Admitting: Physician Assistant

## 2018-12-16 DIAGNOSIS — Z1231 Encounter for screening mammogram for malignant neoplasm of breast: Secondary | ICD-10-CM | POA: Diagnosis not present

## 2018-12-16 DIAGNOSIS — C801 Malignant (primary) neoplasm, unspecified: Secondary | ICD-10-CM | POA: Insufficient documentation

## 2018-12-16 HISTORY — DX: Malignant (primary) neoplasm, unspecified: C80.1

## 2019-12-18 ENCOUNTER — Other Ambulatory Visit: Payer: Self-pay | Admitting: Physician Assistant

## 2019-12-18 DIAGNOSIS — R1084 Generalized abdominal pain: Secondary | ICD-10-CM

## 2019-12-18 DIAGNOSIS — R140 Abdominal distension (gaseous): Secondary | ICD-10-CM

## 2019-12-29 ENCOUNTER — Other Ambulatory Visit: Payer: Self-pay

## 2019-12-29 ENCOUNTER — Ambulatory Visit
Admission: RE | Admit: 2019-12-29 | Discharge: 2019-12-29 | Disposition: A | Discharge status: Home / Self Care | Payer: Medicare Other | Source: Ambulatory Visit | Attending: Physician Assistant | Admitting: Physician Assistant

## 2019-12-29 DIAGNOSIS — R140 Abdominal distension (gaseous): Secondary | ICD-10-CM | POA: Diagnosis not present

## 2019-12-29 DIAGNOSIS — R1084 Generalized abdominal pain: Secondary | ICD-10-CM | POA: Diagnosis present

## 2019-12-29 LAB — POCT I-STAT CREATININE: Creatinine, Ser: 1.1 mg/dL — ABNORMAL HIGH (ref 0.44–1.00)

## 2019-12-29 MED ORDER — IOHEXOL 300 MG/ML  SOLN
100.0000 mL | Freq: Once | INTRAMUSCULAR | Status: AC | PRN
  Administered 2019-12-29: 100 mL via INTRAVENOUS

## 2020-06-14 ENCOUNTER — Emergency Department
Admission: EM | Admit: 2020-06-14 | Discharge: 2020-06-14 | Disposition: A | Discharge status: Home / Self Care | Payer: Medicare Other | Attending: Emergency Medicine | Admitting: Emergency Medicine

## 2020-06-14 ENCOUNTER — Other Ambulatory Visit: Payer: Self-pay

## 2020-06-14 ENCOUNTER — Emergency Department: Payer: Medicare Other

## 2020-06-14 DIAGNOSIS — R42 Dizziness and giddiness: Secondary | ICD-10-CM

## 2020-06-14 DIAGNOSIS — M545 Low back pain, unspecified: Secondary | ICD-10-CM

## 2020-06-14 DIAGNOSIS — Z79899 Other long term (current) drug therapy: Secondary | ICD-10-CM | POA: Diagnosis not present

## 2020-06-14 DIAGNOSIS — R55 Syncope and collapse: Secondary | ICD-10-CM | POA: Insufficient documentation

## 2020-06-14 DIAGNOSIS — I10 Essential (primary) hypertension: Secondary | ICD-10-CM | POA: Insufficient documentation

## 2020-06-14 DIAGNOSIS — Z96653 Presence of artificial knee joint, bilateral: Secondary | ICD-10-CM | POA: Diagnosis not present

## 2020-06-14 LAB — URINALYSIS, COMPLETE (UACMP) WITH MICROSCOPIC
Bilirubin Urine: NEGATIVE
Glucose, UA: NEGATIVE mg/dL
Hgb urine dipstick: NEGATIVE
Ketones, ur: NEGATIVE mg/dL
Nitrite: NEGATIVE
Protein, ur: NEGATIVE mg/dL
Specific Gravity, Urine: 1.012 (ref 1.005–1.030)
pH: 5 (ref 5.0–8.0)

## 2020-06-14 LAB — CBC
HCT: 40.8 % (ref 36.0–46.0)
Hemoglobin: 13.6 g/dL (ref 12.0–15.0)
MCH: 32.7 pg (ref 26.0–34.0)
MCHC: 33.3 g/dL (ref 30.0–36.0)
MCV: 98.1 fL (ref 80.0–100.0)
Platelets: 156 10*3/uL (ref 150–400)
RBC: 4.16 MIL/uL (ref 3.87–5.11)
RDW: 12.6 % (ref 11.5–15.5)
WBC: 4.7 10*3/uL (ref 4.0–10.5)
nRBC: 0 % (ref 0.0–0.2)

## 2020-06-14 LAB — BASIC METABOLIC PANEL
Anion gap: 7 (ref 5–15)
BUN: 23 mg/dL (ref 8–23)
CO2: 24 mmol/L (ref 22–32)
Calcium: 9.2 mg/dL (ref 8.9–10.3)
Chloride: 106 mmol/L (ref 98–111)
Creatinine, Ser: 1.33 mg/dL — ABNORMAL HIGH (ref 0.44–1.00)
GFR calc Af Amer: 44 mL/min — ABNORMAL LOW (ref >60)
GFR calc non Af Amer: 38 mL/min — ABNORMAL LOW (ref >60)
Glucose, Bld: 96 mg/dL (ref 70–99)
Potassium: 4.1 mmol/L (ref 3.5–5.1)
Sodium: 137 mmol/L (ref 135–145)

## 2020-06-14 LAB — TROPONIN I (HIGH SENSITIVITY)
Troponin I (High Sensitivity): 4 ng/L (ref <18)
Troponin I (High Sensitivity): 4 ng/L (ref <18)

## 2020-06-14 MED ORDER — SODIUM CHLORIDE 0.9% FLUSH: 3.0000 mL | Freq: Once | INTRAVENOUS | Status: DC

## 2020-06-14 MED ORDER — MECLIZINE HCL 25 MG PO TABS
25.0000 mg | ORAL_TABLET | Freq: Once | ORAL | Status: AC
  Administered 2020-06-14: 25 mg via ORAL
  Filled 2020-06-14: qty 1

## 2020-06-14 MED ORDER — LIDOCAINE 5 % EX PTCH
1.0000 | MEDICATED_PATCH | CUTANEOUS | Status: DC
  Administered 2020-06-14: 1 via TRANSDERMAL
  Filled 2020-06-14: qty 1

## 2020-06-14 MED ORDER — LIDOCAINE 5 % EX PTCH: 1.0000 | MEDICATED_PATCH | Freq: Two times a day (BID) | CUTANEOUS | 0 refills | Status: AC

## 2020-06-14 MED ORDER — ACETAMINOPHEN 500 MG PO TABS
1000.0000 mg | ORAL_TABLET | Freq: Once | ORAL | Status: AC
  Administered 2020-06-14: 1000 mg via ORAL
  Filled 2020-06-14: qty 2

## 2020-06-14 NOTE — ED Triage Notes (Signed)
Pt arrives via ems, pt states that she has been dealing with vertigo intermittently for the past few months, pt states that she has also had a decreased appetite and has lost 2lbs and thinks she needs to gain weight. Pt reports feeling ok right now, pt didn't fall was assisted to the ground by friend

## 2020-06-14 NOTE — ED Triage Notes (Signed)
Pt in via EMS from home with c/o syncope. Pt never passed all the way out and was helped to the ground.   153/94, 65HR, 96%RA, CBG 103

## 2020-06-14 NOTE — ED Provider Notes (Signed)
[facility] Emergency Department Provider Note   ____________________________________________   First MD Initiated Contact with Patient 06/14/20 1930     (approximate)  I have reviewed the triage vital signs and the nursing notes.   HISTORY  Chief Complaint Loss of Consciousness and Dizziness    HPI Sherry Owens is a 79 y.o. female with past medical history of hypertension, rheumatoid arthritis, and anxiety who presents to the ED complaining of dizziness.  Patient reports she has had intermittent episodes of lightheadedness and feeling like the room is spinning around her when she stands up to move around.  She has had a couple falls due to this over the past few months, but denies any falls within the past week.  She had another episode today of dizziness and lightheadedness, started to fall to the ground but was assisted by a friend.  She denies hitting her head or fully losing consciousness.  She reports seeing her PCP for this problem yesterday and was prescribed meclizine, also given referral to ENT.  She additionally endorses some pain in the middle of her lower back, denies any difficulty urinating, saddle anesthesia, numbness or weakness in her extremities.        Past Medical History:  Diagnosis Date  . Anginal pain ([place]) 2019   currently has pain w/ stress  . Anxiety   . Arthritis   . Cancer ([place])    cancer within appendix per pt  . Collagen vascular disease (HCC)    RA  . Depression   . Genital herpes simplex   . GERD (gastroesophageal reflux disease)   . Hypertension     Patient Active Problem List   Diagnosis Date Noted  . Cancer ([place])   . 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 ABDOMINAL HYSTERECTOMY    . COLONOSCOPY    . COLONOSCOPY WITH PROPOFOL N/A 05/04/2018   Procedure: COLONOSCOPY WITH PROPOFOL;  Surgeon: Toledo, Benay Pike, MD;  Location: [facility] ENDOSCOPY;  Service:  Gastroenterology;  Laterality: N/A;  . ESOPHAGOGASTRODUODENOSCOPY (EGD) WITH PROPOFOL N/A 05/04/2018   Procedure: ESOPHAGOGASTRODUODENOSCOPY (EGD) WITH PROPOFOL;  Surgeon: Toledo, Benay Pike, MD;  Location: [facility] ENDOSCOPY;  Service: Gastroenterology;  Laterality: N/A;  . JOINT REPLACEMENT Bilateral    bilateral total knees  . LAPAROSCOPIC RIGHT HEMI COLECTOMY Right 09/07/2018   Procedure: LAPAROSCOPIC RIGHT HEMI COLECTOMY;  Surgeon: Benjamine Sprague, DO;  Location: [facility] ORS;  Service: General;  Laterality: Right;  . OOPHORECTOMY    . TUBAL LIGATION      Prior to Admission medications   Medication Sig Start Date End Date Taking? Authorizing Provider  acetaminophen (TYLENOL) 325 MG tablet Take 2 tablets (650 mg total) by mouth every 6 (six) hours as needed for mild pain. 09/12/18   Lysle Pearl, Isami, DO  alendronate (FOSAMAX) 70 MG tablet Take 70 mg by mouth once a week. Take with a full glass of water on an empty stomach.    [provider]  escitalopram (LEXAPRO) 20 MG tablet Take 20 mg by mouth daily.    [provider]  hydrALAZINE (APRESOLINE) 25 MG tablet Take 25 mg by mouth 3 (three) times daily.    [provider]  HYDROcodone-acetaminophen (NORCO/VICODIN) 5-325 MG tablet Take 1 tablet by mouth every 6 (six) hours as needed for moderate pain. 09/12/18   Lysle Pearl, Isami, DO  isosorbide mononitrate (IMDUR) 30 MG 24 hr tablet Take 30 mg by mouth daily.  [provider]  lidocaine (LIDODERM) 5 % Place 1 patch onto the skin every 12 (twelve) hours. Remove & Discard patch within 12 hours or as directed by MD 06/14/20 06/14/21  Blake Divine, MD  LORazepam (ATIVAN) 0.5 MG tablet Take 0.5 mg by mouth daily at 8 pm.     [provider]  losartan (COZAAR) 50 MG tablet Take 50 mg by mouth daily.    [provider]  meloxicam (MOBIC) 15 MG tablet Take 15 mg by mouth daily.    [provider]  omeprazole (PRILOSEC) 20 MG capsule Take 20 mg by mouth daily.     [provider]  phenylephrine (SUDAFED PE) 10 MG TABS tablet Take 10 mg by mouth every 4 (four) hours as needed (for sinus drainage).    [provider]  valACYclovir (VALTREX) 500 MG tablet Take 500 mg by mouth daily.     [provider]  vitamin B-12 (CYANOCOBALAMIN) 1000 MCG tablet Take 1,000 mcg by mouth daily.    [provider]    Allergies Lactose intolerance (gi)  Family History  Problem Relation Age of Onset  . Breast cancer Maternal Aunt     Social History Social History   Tobacco Use  . Smoking status: Never Smoker  . Smokeless tobacco: Never Used  Vaping Use  . Vaping Use: Never used  Substance Use Topics  . Alcohol use: No  . Drug use: Never    Review of Systems  Constitutional: No fever/chills.  Positive for dizziness and lightheadedness. Eyes: No visual changes. ENT: No sore throat. Cardiovascular: Denies chest pain. Respiratory: Denies shortness of breath. Gastrointestinal: No abdominal pain.  No nausea, no vomiting.  No diarrhea.  No constipation. Genitourinary: Negative for dysuria. Musculoskeletal: Positive for back pain. Skin: Negative for rash. Neurological: Negative for headaches, focal weakness or numbness.  ____________________________________________   PHYSICAL EXAM:  VITAL SIGNS: ED Triage Vitals [06/14/20 1628]  Enc Vitals Group     BP (!) 166/91     Pulse Rate (!) 58     Resp 16     Temp 98 F (36.7 C)     Temp Source Oral     SpO2 96 %     Weight 187 lb (84.8 kg)     Height 5\' 5"  (1.651 m)     Head Circumference      Peak Flow      Pain Score 0     Pain Loc      Pain Edu?      Excl. in [place]?     Constitutional: Alert and oriented. Eyes: Conjunctivae are normal. Head: Atraumatic. Nose: No congestion/rhinnorhea. Mouth/Throat: Mucous membranes are moist. Neck: Normal ROM Cardiovascular: Normal rate, regular rhythm. Grossly normal heart sounds.  2+ radial and DP pulses  bilaterally. Respiratory: Normal respiratory effort.  No retractions. Lungs CTAB. Gastrointestinal: Soft and nontender. No distention. Genitourinary: deferred Musculoskeletal: No lower extremity tenderness nor edema.  Midline lumbar spinal tenderness to palpation. Neurologic:  Normal speech and language. No gross focal neurologic deficits are appreciated. Skin:  Skin is warm, dry and intact. No rash noted. Psychiatric: Mood and affect are normal. Speech and behavior are normal.  ____________________________________________   LABS (all labs ordered are listed, but only abnormal results are displayed)  Labs Reviewed  BASIC METABOLIC PANEL - Abnormal; Notable for the following components:      Result Value   Creatinine, Ser 1.33 (*)    GFR calc non Af Amer 38 (*)  GFR calc Af Amer 44 (*)    All other components within normal limits  URINALYSIS, COMPLETE (UACMP) WITH MICROSCOPIC - Abnormal; Notable for the following components:   Color, Urine YELLOW (*)    APPearance CLEAR (*)    Leukocytes,Ua TRACE (*)    Bacteria, UA RARE (*)    All other components within normal limits  URINE CULTURE  CBC  CBG MONITORING, ED  TROPONIN I (HIGH SENSITIVITY)  TROPONIN I (HIGH SENSITIVITY)   ____________________________________________  EKG  ED ECG REPORT I, Blake Divine, the attending physician, personally viewed and interpreted this ECG.   Date: 06/14/2020  EKG Time: 16:39  Rate: 58  Rhythm: sinus bradycardia  Axis: LAD  Intervals:none  ST&T Change: None   PROCEDURES  Procedure(s) performed (including Critical Care):  Procedures   ____________________________________________   INITIAL IMPRESSION / ASSESSMENT AND PLAN / ED COURSE       79 year old female with history of hypertension, RA, and anxiety presents to the ED complaining of many months of dizziness with another episode today where she was about to fall to the ground but was caught by a friend.  She has not had  any recent trauma to her head or neck, no indication for advanced imaging.  She does complain of some pain to the middle of her lower back with tenderness on exam, we will check x-ray but no findings to suggest cauda equina.  Lab work is reassuring and EKG shows no evidence of arrhythmia or ischemia.  2 sets of troponin are negative and I have low suspicion for cardiac etiology of her symptoms.  We will give dose of meclizine, treat back pain with Lidoderm and Tylenol.  Patient currently lives alone and does seem to be fairly limited due to her episodes of dizziness.  I have encouraged her to follow-up with ENT and also discuss getting more help at home with her PCP.  Patient reports pain and dizziness are improved, lumbar spinal x-ray is negative for acute process.  She is appropriate for discharge home and has meclizine available, also provided with referral to ENT.  She was counseled to use Tylenol and Lidoderm patches as needed for back pain, otherwise counseled to return to the ED for new or worsening symptoms.  Patient agrees with plan.      ____________________________________________   FINAL CLINICAL IMPRESSION(S) / ED DIAGNOSES  Final diagnoses:  Vertigo  Acute midline low back pain without sciatica     ED Discharge Orders         Ordered    lidocaine (LIDODERM) 5 %  Every 12 hours     Discontinue  Reprint     06/14/20 2108           Note:  This document was prepared using Dragon voice recognition software and may include unintentional dictation errors.   Blake Divine, MD 06/14/20 2109

## 2020-06-18 LAB — URINE CULTURE: Culture: 10000 — AB

## 2020-06-19 ENCOUNTER — Other Ambulatory Visit: Payer: Self-pay | Admitting: Physician Assistant

## 2020-06-19 DIAGNOSIS — R42 Dizziness and giddiness: Secondary | ICD-10-CM

## 2020-06-19 DIAGNOSIS — R55 Syncope and collapse: Secondary | ICD-10-CM

## 2020-10-30 ENCOUNTER — Other Ambulatory Visit: Payer: Self-pay

## 2020-10-30 ENCOUNTER — Emergency Department
Admission: EM | Admit: 2020-10-30 | Discharge: 2020-10-30 | Disposition: A | Discharge status: Home / Self Care | Payer: Medicare Other | Attending: Emergency Medicine | Admitting: Emergency Medicine

## 2020-10-30 ENCOUNTER — Emergency Department: Payer: Medicare Other

## 2020-10-30 ENCOUNTER — Encounter: Payer: Self-pay | Admitting: Emergency Medicine

## 2020-10-30 DIAGNOSIS — Z859 Personal history of malignant neoplasm, unspecified: Secondary | ICD-10-CM | POA: Diagnosis not present

## 2020-10-30 DIAGNOSIS — Y92410 Unspecified street and highway as the place of occurrence of the external cause: Secondary | ICD-10-CM | POA: Insufficient documentation

## 2020-10-30 DIAGNOSIS — Z96653 Presence of artificial knee joint, bilateral: Secondary | ICD-10-CM | POA: Diagnosis not present

## 2020-10-30 DIAGNOSIS — S161XXA Strain of muscle, fascia and tendon at neck level, initial encounter: Secondary | ICD-10-CM | POA: Diagnosis not present

## 2020-10-30 DIAGNOSIS — I10 Essential (primary) hypertension: Secondary | ICD-10-CM | POA: Diagnosis not present

## 2020-10-30 DIAGNOSIS — Z79899 Other long term (current) drug therapy: Secondary | ICD-10-CM | POA: Insufficient documentation

## 2020-10-30 DIAGNOSIS — S39012A Strain of muscle, fascia and tendon of lower back, initial encounter: Secondary | ICD-10-CM | POA: Insufficient documentation

## 2020-10-30 DIAGNOSIS — M545 Low back pain, unspecified: Secondary | ICD-10-CM | POA: Diagnosis present

## 2020-10-30 MED ORDER — PREDNISONE 50 MG PO TABS
50.0000 mg | ORAL_TABLET | Freq: Every day | ORAL | 0 refills | Status: DC
Start: 2020-10-30 — End: 2021-03-26

## 2020-10-30 MED ORDER — METHOCARBAMOL 500 MG PO TABS: 500.0000 mg | ORAL_TABLET | Freq: Four times a day (QID) | ORAL | 0 refills | Status: DC
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
## 2021-03-25 ENCOUNTER — Emergency Department: Payer: Medicare Other

## 2021-03-25 ENCOUNTER — Observation Stay
Admission: EM | Admit: 2021-03-25 | Discharge: 2021-03-26 | Disposition: A | Discharge status: Home / Self Care | Payer: Medicare Other | Attending: Internal Medicine | Admitting: Internal Medicine

## 2021-03-25 ENCOUNTER — Other Ambulatory Visit: Payer: Self-pay

## 2021-03-25 DIAGNOSIS — N3000 Acute cystitis without hematuria: Secondary | ICD-10-CM | POA: Diagnosis not present

## 2021-03-25 DIAGNOSIS — Z79899 Other long term (current) drug therapy: Secondary | ICD-10-CM | POA: Diagnosis not present

## 2021-03-25 DIAGNOSIS — Z96653 Presence of artificial knee joint, bilateral: Secondary | ICD-10-CM | POA: Diagnosis not present

## 2021-03-25 DIAGNOSIS — R2689 Other abnormalities of gait and mobility: Secondary | ICD-10-CM | POA: Diagnosis not present

## 2021-03-25 DIAGNOSIS — Z85038 Personal history of other malignant neoplasm of large intestine: Secondary | ICD-10-CM | POA: Diagnosis not present

## 2021-03-25 DIAGNOSIS — R5383 Other fatigue: Secondary | ICD-10-CM | POA: Diagnosis present

## 2021-03-25 DIAGNOSIS — R8281 Pyuria: Secondary | ICD-10-CM | POA: Diagnosis present

## 2021-03-25 DIAGNOSIS — R531 Weakness: Secondary | ICD-10-CM | POA: Diagnosis not present

## 2021-03-25 DIAGNOSIS — Z8673 Personal history of transient ischemic attack (TIA), and cerebral infarction without residual deficits: Secondary | ICD-10-CM

## 2021-03-25 DIAGNOSIS — R262 Difficulty in walking, not elsewhere classified: Secondary | ICD-10-CM

## 2021-03-25 DIAGNOSIS — I10 Essential (primary) hypertension: Secondary | ICD-10-CM | POA: Diagnosis present

## 2021-03-25 DIAGNOSIS — Z20822 Contact with and (suspected) exposure to covid-19: Secondary | ICD-10-CM | POA: Insufficient documentation

## 2021-03-25 HISTORY — DX: Age-related osteoporosis without current pathological fracture: M81.0

## 2021-03-25 LAB — URINALYSIS, COMPLETE (UACMP) WITH MICROSCOPIC
Bacteria, UA: NONE SEEN
Bilirubin Urine: NEGATIVE
Glucose, UA: NEGATIVE mg/dL
Hgb urine dipstick: NEGATIVE
Ketones, ur: NEGATIVE mg/dL
Nitrite: NEGATIVE
Protein, ur: 30 mg/dL — AB
Specific Gravity, Urine: 1.019 (ref 1.005–1.030)
pH: 5 (ref 5.0–8.0)

## 2021-03-25 LAB — CBC WITH DIFFERENTIAL/PLATELET
Abs Immature Granulocytes: 0.01 10*3/uL (ref 0.00–0.07)
Basophils Absolute: 0 10*3/uL (ref 0.0–0.1)
Basophils Relative: 1 %
Eosinophils Absolute: 0.1 10*3/uL (ref 0.0–0.5)
Eosinophils Relative: 1 %
HCT: 39.9 % (ref 36.0–46.0)
Hemoglobin: 13.3 g/dL (ref 12.0–15.0)
Immature Granulocytes: 0 %
Lymphocytes Relative: 35 %
Lymphs Abs: 1.4 10*3/uL (ref 0.7–4.0)
MCH: 32.6 pg (ref 26.0–34.0)
MCHC: 33.3 g/dL (ref 30.0–36.0)
MCV: 97.8 fL (ref 80.0–100.0)
Monocytes Absolute: 0.4 10*3/uL (ref 0.1–1.0)
Monocytes Relative: 9 %
Neutro Abs: 2.2 10*3/uL (ref 1.7–7.7)
Neutrophils Relative %: 54 %
Platelets: 153 10*3/uL (ref 150–400)
RBC: 4.08 MIL/uL (ref 3.87–5.11)
RDW: 12.6 % (ref 11.5–15.5)
WBC: 4.1 10*3/uL (ref 4.0–10.5)
nRBC: 0 % (ref 0.0–0.2)

## 2021-03-25 LAB — COMPREHENSIVE METABOLIC PANEL
ALT: 16 U/L (ref 0–44)
AST: 32 U/L (ref 15–41)
Albumin: 3.7 g/dL (ref 3.5–5.0)
Alkaline Phosphatase: 97 U/L (ref 38–126)
Anion gap: 6 (ref 5–15)
BUN: 22 mg/dL (ref 8–23)
CO2: 23 mmol/L (ref 22–32)
Calcium: 9.2 mg/dL (ref 8.9–10.3)
Chloride: 108 mmol/L (ref 98–111)
Creatinine, Ser: 1.28 mg/dL — ABNORMAL HIGH (ref 0.44–1.00)
GFR, Estimated: 42 mL/min — ABNORMAL LOW (ref >60)
Glucose, Bld: 113 mg/dL — ABNORMAL HIGH (ref 70–99)
Potassium: 4.1 mmol/L (ref 3.5–5.1)
Sodium: 137 mmol/L (ref 135–145)
Total Bilirubin: 0.9 mg/dL (ref 0.3–1.2)
Total Protein: 7.7 g/dL (ref 6.5–8.1)

## 2021-03-25 LAB — TSH: TSH: 0.445 u[IU]/mL (ref 0.350–4.500)

## 2021-03-25 LAB — TROPONIN I (HIGH SENSITIVITY)
Troponin I (High Sensitivity): 5 ng/L (ref <18)
Troponin I (High Sensitivity): 6 ng/L (ref <18)
Troponin I (High Sensitivity): 6 ng/L (ref <18)

## 2021-03-25 LAB — CK: Total CK: 156 U/L (ref 38–234)

## 2021-03-25 LAB — MAGNESIUM: Magnesium: 2.3 mg/dL (ref 1.7–2.4)

## 2021-03-25 LAB — PROCALCITONIN: Procalcitonin: <0.1 ng/mL

## 2021-03-25 MED ORDER — LORAZEPAM 0.5 MG PO TABS
0.5000 mg | ORAL_TABLET | Freq: Every day | ORAL | Status: DC
  Administered 2021-03-25: 0.5 mg via ORAL
  Filled 2021-03-25: qty 1

## 2021-03-25 MED ORDER — MELOXICAM 7.5 MG PO TABS
15.0000 mg | ORAL_TABLET | Freq: Every day | ORAL | Status: DC
  Administered 2021-03-26: 15 mg via ORAL
  Filled 2021-03-25: qty 2

## 2021-03-25 MED ORDER — ESCITALOPRAM OXALATE 10 MG PO TABS
20.0000 mg | ORAL_TABLET | Freq: Every day | ORAL | Status: DC
  Administered 2021-03-26: 20 mg via ORAL
  Filled 2021-03-25: qty 2

## 2021-03-25 MED ORDER — ENOXAPARIN SODIUM 40 MG/0.4ML ~~LOC~~ SOLN
40.0000 mg | SUBCUTANEOUS | Status: DC
  Administered 2021-03-25: 40 mg via SUBCUTANEOUS
  Filled 2021-03-25: qty 0.4

## 2021-03-25 MED ORDER — SODIUM CHLORIDE 0.45 % IV SOLN: INTRAVENOUS | Status: AC

## 2021-03-25 MED ORDER — VALACYCLOVIR HCL 500 MG PO TABS
500.0000 mg | ORAL_TABLET | Freq: Every day | ORAL | Status: DC
  Administered 2021-03-26: 500 mg via ORAL
  Filled 2021-03-25: qty 1

## 2021-03-25 MED ORDER — PANTOPRAZOLE SODIUM 40 MG PO TBEC
40.0000 mg | DELAYED_RELEASE_TABLET | Freq: Every day | ORAL | Status: DC
  Administered 2021-03-26: 40 mg via ORAL
  Filled 2021-03-25: qty 1

## 2021-03-25 MED ORDER — ISOSORBIDE MONONITRATE ER 60 MG PO TB24
30.0000 mg | ORAL_TABLET | Freq: Every day | ORAL | Status: DC
  Administered 2021-03-26: 30 mg via ORAL
  Filled 2021-03-25: qty 1

## 2021-03-25 MED ORDER — SODIUM CHLORIDE 0.9 % IV SOLN
1.0000 g | Freq: Once | INTRAVENOUS | Status: AC
  Administered 2021-03-25: 1 g via INTRAVENOUS
  Filled 2021-03-25: qty 10

## 2021-03-25 MED ORDER — HYDRALAZINE HCL 50 MG PO TABS
25.0000 mg | ORAL_TABLET | Freq: Three times a day (TID) | ORAL | Status: DC
  Administered 2021-03-25 – 2021-03-26 (×3): 25 mg via ORAL
  Filled 2021-03-25 (×3): qty 1

## 2021-03-25 MED ORDER — LACTATED RINGERS IV BOLUS
500.0000 mL | Freq: Once | INTRAVENOUS | Status: AC
  Administered 2021-03-25: 500 mL via INTRAVENOUS

## 2021-03-25 MED ORDER — NITROGLYCERIN 0.4 MG SL SUBL: 0.4000 mg | SUBLINGUAL_TABLET | SUBLINGUAL | Status: DC | PRN

## 2021-03-25 MED ORDER — ACETAMINOPHEN 325 MG PO TABS
650.0000 mg | ORAL_TABLET | Freq: Four times a day (QID) | ORAL | Status: DC | PRN
  Administered 2021-03-25: 650 mg via ORAL
  Filled 2021-03-25: qty 2

## 2021-03-25 MED ORDER — LOSARTAN POTASSIUM 50 MG PO TABS
50.0000 mg | ORAL_TABLET | Freq: Every day | ORAL | Status: DC
  Administered 2021-03-25 – 2021-03-26 (×2): 50 mg via ORAL
  Filled 2021-03-25 (×2): qty 1

## 2021-03-25 MED ORDER — VITAMIN B-12 1000 MCG PO TABS
1000.0000 ug | ORAL_TABLET | Freq: Every day | ORAL | Status: DC
  Administered 2021-03-26: 1000 ug via ORAL
  Filled 2021-03-25: qty 1

## 2021-03-25 NOTE — ED Provider Notes (Signed)
[facility] Emergency Department Provider Note  ____________________________________________   Event Date/Time   First MD Initiated Contact with Patient 03/25/21 1631     (approximate)  I have reviewed the triage vital signs and the nursing notes.   HISTORY  Chief Complaint Chest Pain   HPI Sherry Owens is a 80 y.o. female who Paschal history of GERD, depression, collagen vascular disease, HTN, arthritis, anxiety and angina with exertion who presents for assessment of 2 days or so of worsening general fatigue and weakness as well as some chest tightness today in the setting of doing for significant amount of yard work the last couple days and working a lot outside.  Patient think she has not been drinking enoughand limit drinking Pepsi.  She states she developed some chest pressure earlier today.  She states she has had this happen before but it is least been several years.  She denies any recent falls or injuries, headache, earache, sore throat, focal weakness, cough, shortness of breath, Donnell pain, nausea, vomiting, diarrhea or dysuria, rash or any other acute sick symptoms.  She states she took her GERD medication this morning but not her blood pressure medicine.  Otherwise she has been compliant with her medications.  No other acute concerns at this time.         Past Medical History:  Diagnosis Date  . Anginal pain ([place]) 2019   currently has pain w/ stress  . Anxiety   . Arthritis   . Cancer ([place])    cancer within appendix per pt  . Collagen vascular disease ([facility])    RA  . Depression   . Genital herpes simplex   . GERD (gastroesophageal reflux disease)   . Hypertension     Patient Active Problem List   Diagnosis Date Noted  . Cancer ([place])   . 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 ABDOMINAL HYSTERECTOMY    . COLONOSCOPY    . COLONOSCOPY WITH PROPOFOL N/A 05/04/2018   Procedure:  COLONOSCOPY WITH PROPOFOL;  Surgeon: Toledo, Benay Pike, MD;  Location: [facility] ENDOSCOPY;  Service: Gastroenterology;  Laterality: N/A;  . ESOPHAGOGASTRODUODENOSCOPY (EGD) WITH PROPOFOL N/A 05/04/2018   Procedure: ESOPHAGOGASTRODUODENOSCOPY (EGD) WITH PROPOFOL;  Surgeon: Toledo, Benay Pike, MD;  Location: [facility] ENDOSCOPY;  Service: Gastroenterology;  Laterality: N/A;  . JOINT REPLACEMENT Bilateral    bilateral total knees  . LAPAROSCOPIC RIGHT HEMI COLECTOMY Right 09/07/2018   Procedure: LAPAROSCOPIC RIGHT HEMI COLECTOMY;  Surgeon: Benjamine Sprague, DO;  Location: [facility] ORS;  Service: General;  Laterality: Right;  . OOPHORECTOMY    . TUBAL LIGATION      Prior to Admission medications   Medication Sig Start Date End Date Taking? Authorizing Provider  acetaminophen (TYLENOL) 325 MG tablet Take 2 tablets (650 mg total) by mouth every 6 (six) hours as needed for mild pain. 09/12/18   Lysle Pearl, Isami, DO  alendronate (FOSAMAX) 70 MG tablet Take 70 mg by mouth once a week. Take with a full glass of water on an empty stomach.    [provider]  escitalopram (LEXAPRO) 20 MG tablet Take 20 mg by mouth daily.    [provider]  hydrALAZINE (APRESOLINE) 25 MG tablet Take 25 mg by mouth 3 (three) times daily.    [provider]  HYDROcodone-acetaminophen (NORCO/VICODIN) 5-325 MG tablet Take 1 tablet by mouth every 6 (six) hours as needed for moderate pain. 09/12/18   Benjamine Sprague, DO  isosorbide mononitrate (IMDUR) 30 MG 24 hr tablet Take 30 mg by mouth daily.    [provider]  lidocaine (LIDODERM) 5 % Place 1 patch onto the skin every 12 (twelve) hours. Remove & Discard patch within 12 hours or as directed by MD 06/14/20 06/14/21  Blake Divine, MD  LORazepam (ATIVAN) 0.5 MG tablet Take 0.5 mg by mouth daily at 8 pm.     [provider]  losartan (COZAAR) 50 MG tablet Take 50 mg by mouth daily.    [provider]  meloxicam (MOBIC) 15 MG tablet Take 15 mg by mouth  daily.    [provider]  methocarbamol (ROBAXIN) 500 MG tablet Take 1 tablet (500 mg total) by mouth 4 (four) times daily. 10/30/20   Cuthriell, Charline Bills, PA-C  omeprazole (PRILOSEC) 20 MG capsule Take 20 mg by mouth daily.    [provider]  phenylephrine (SUDAFED PE) 10 MG TABS tablet Take 10 mg by mouth every 4 (four) hours as needed (for sinus drainage).    [provider]  predniSONE (DELTASONE) 50 MG tablet Take 1 tablet (50 mg total) by mouth daily with breakfast. 10/30/20   Cuthriell, Charline Bills, PA-C  valACYclovir (VALTREX) 500 MG tablet Take 500 mg by mouth daily.     [provider]  vitamin B-12 (CYANOCOBALAMIN) 1000 MCG tablet Take 1,000 mcg by mouth daily.    [provider]    Allergies Lactose intolerance (gi)  Family History  Problem Relation Age of Onset  . Breast cancer Maternal Aunt     Social History Social History   Tobacco Use  . Smoking status: Never Smoker  . Smokeless tobacco: Never Used  Vaping Use  . Vaping Use: Never used  Substance Use Topics  . Alcohol use: No  . Drug use: Never    Review of Systems  Review of Systems  Constitutional: Positive for malaise/fatigue. Negative for chills and fever.  HENT: Negative for sore throat.   Eyes: Negative for pain.  Respiratory: Negative for cough and stridor.   Cardiovascular: Positive for chest pain.  Gastrointestinal: Negative for vomiting.  Genitourinary: Negative for dysuria.  Musculoskeletal: Negative for myalgias.  Skin: Negative for rash.  Neurological: Positive for weakness. Negative for seizures, loss of consciousness and headaches.  Psychiatric/Behavioral: Negative for suicidal ideas.  All other systems reviewed and are negative.    Cranial nerves II through XII grossly intact.  Patient has symmetric strength in upper and lower extremities.  2+ bilateral radial and DP pulses.  Sensation intact light touch in all  extremities. ____________________________________________   PHYSICAL EXAM:  VITAL SIGNS: ED Triage Vitals  Enc Vitals Group     BP      Pulse      Resp      Temp      Temp src      SpO2      Weight      Height      Head Circumference      Peak Flow      Pain Score      Pain Loc      Pain Edu?      Excl. in [place]?    Vitals:   03/25/21 1930 03/25/21 2000  BP: (!) 189/100 (!) 164/92  Pulse: (!) 59 66  Resp:  (!) 21  Temp:    SpO2: 96% 97%   Physical Exam Vitals and nursing note reviewed.  Constitutional:      General:  She is not in acute distress.    Appearance: She is well-developed. She is obese.  HENT:     Head: Normocephalic and atraumatic.     Right Ear: External ear normal.     Left Ear: External ear normal.     Nose: Nose normal.     Mouth/Throat:     Mouth: Mucous membranes are dry.  Eyes:     Conjunctiva/sclera: Conjunctivae normal.  Cardiovascular:     Rate and Rhythm: Normal rate and regular rhythm.     Heart sounds: No murmur heard.   Pulmonary:     Effort: Pulmonary effort is normal. No respiratory distress.     Breath sounds: Normal breath sounds.  Abdominal:     Palpations: Abdomen is soft.     Tenderness: There is no abdominal tenderness.  Musculoskeletal:     Cervical back: Neck supple.  Skin:    General: Skin is warm and dry.     Capillary Refill: Capillary refill takes 2 to 3 seconds.  Neurological:     Mental Status: She is alert and oriented to person, place, and time.     Cranial nerves II to XII grossly intact.  Patient has symmetric strength in upper and lower extremities although is not able to ambulate without significant two-person assistance. ____________________________________________   LABS (all labs ordered are listed, but only abnormal results are displayed)  Labs Reviewed  COMPREHENSIVE METABOLIC PANEL - Abnormal; Notable for the following components:      Result Value   Glucose, Bld 113 (*)    Creatinine, Ser  1.28 (*)    GFR, Estimated 42 (*)    All other components within normal limits  URINALYSIS, COMPLETE (UACMP) WITH MICROSCOPIC - Abnormal; Notable for the following components:   Color, Urine YELLOW (*)    APPearance CLEAR (*)    Protein, ur 30 (*)    Leukocytes,Ua LARGE (*)    All other components within normal limits  SARS CORONAVIRUS 2 (TAT 6-24 HRS)  URINE CULTURE  CBC WITH DIFFERENTIAL/PLATELET  MAGNESIUM  CK  PROCALCITONIN  TSH  TROPONIN I (HIGH SENSITIVITY)  TROPONIN I (HIGH SENSITIVITY)  TROPONIN I (HIGH SENSITIVITY)   ____________________________________________  EKG  Sinus rhythm with a ventricular to 63, normal axis, unremarkable intervals and nonspecific change in aVL without any other clearance of acute ischemia or other significant underlying arrhythmia. ____________________________________________  RADIOLOGY  ED MD interpretation: Chest x-ray has no evidence of pulmonary edema, effusion, significant pneumothorax or for consolidation or other clear acute intrathoracic process.  No evidence of intracranial hemorrhage on CT head or other acute intracranial process although there is an age-indeterminate infarct.  Official radiology report(s): DG Chest 2 View  Result Date: 03/25/2021 CLINICAL DATA:  Chest pain. EXAM: CHEST - 2 VIEW COMPARISON:  CT abdomen pelvis 12/30/2019, chest x-ray 03/25/2018. CT chest 01/02/2009 FINDINGS: The heart size and mediastinal contours are within normal limits. No focal consolidation. No pulmonary edema. No pleural effusion. No pneumothorax. No acute osseous abnormality. IMPRESSION: No active cardiopulmonary disease. Electronically Signed   By: Iven Finn M.D.   On: 03/25/2021 17:52   CT Head Wo Contrast  Result Date: 03/25/2021 CLINICAL DATA:  Weakness EXAM: CT HEAD WITHOUT CONTRAST TECHNIQUE: Contiguous axial images were obtained from the base of the skull through the vertex without intravenous contrast. COMPARISON:  None. FINDINGS:  Brain: No acute territorial infarction, hemorrhage or intracranial mass. Age indeterminate lacunar infarct in the right basal ganglia. Mild white matter hypodensity likely chronic  small vessel ischemic change. Nonenlarged ventricles Vascular: No hyperdense vessels.  Carotid vascular calcification Skull: Normal. Negative for fracture or focal lesion. Sinuses/Orbits: No acute finding. Other: None IMPRESSION: 1. Age indeterminate lacunar infarct in the right basal ganglia. Otherwise no CT evidence for acute intracranial abnormality 2. Mild small vessel ischemic changes of the white matter. Electronically Signed   By: Donavan Foil M.D.   On: 03/25/2021 20:22    ____________________________________________   PROCEDURES  Procedure(s) performed (including Critical Care):  .1-3 Lead EKG Interpretation Performed by: Lucrezia Starch, MD Authorized by: Lucrezia Starch, MD     Interpretation: normal     ECG rate assessment: normal     Rhythm: sinus rhythm     Ectopy: none     Conduction: normal       ____________________________________________   INITIAL IMPRESSION / ASSESSMENT AND PLAN / ED COURSE      Patient presents with left history exam for assessment of generalized weakness for last couple of days and some chest pressure today in the setting of exerting herself working in her yard over the last couple of days endorsing some decreased p.o. intake from baseline.  On arrival she was hypertensive with BP of 186/7 9 with otherwise stable vital signs on room air.  She does appear dry but is nonfocal supine neuro exam her lungs are clear bilaterally and abdomen is soft nontender throughout.  Differential includes unstable angina, occult injury injury, symptomatic arrhythmia, anemia, UTI, metabolic derangements, dehydration, GERD, pneumonia and heart failure.  No focal deficits to suggest CVA and suspect age-indeterminate infarct is likely subacute to chronic.  ECG has nonspecific findings  but given nonelevated troponin suspicion for ACS I will plan to obtain a repeat.  No significant arrhythmia identified on ECG.  CMP shows no significant electrolyte or metabolic derangements.  CBC shows no leukocytosis or acute anemia.  Anemia seems unremarkable.  CK not distant with rhabdomyolysis.  TSH is unremarkable.  Undetectable and overall level is sufficient for pneumonia.  UA suggestive of possible urinary tract infection with large LES and 11-20 WBCs.  Given otherwise reassuring exam work-up I suspect urinary tract infection may be a significant driving factor to patient's weakness and we do stand ambulate without assistance.  She does appear slightly hydrated was given some IV fluids as well.  Given a bed ambulate likely secondary to infection will admit to medicine service.  Urine culture obtained patient given dose of Rocephin in the ED.  Home BP meds also ordered given to patient's hypertension elevated I believe her symptoms are related to hypertensive emergency at this time she denies any headache vertigo vision changes and is no neurological deficits..  Patient CT head is unremarkable for any evidence of SAH suspect age-indeterminate infarct is likely subacute to chronic there is no obvious focal deficits on exam..      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   Lucrezia Starch, MD 03/25/21 2031

## 2021-03-25 NOTE — ED Notes (Signed)
Admitting MD at bedside.

## 2021-03-25 NOTE — ED Triage Notes (Signed)
Pt c/o chest pain since yesterday and worse today.  Pt reports feeling profoundly weak, sts her legs were giving out. Pt reports she has been working in the yard for the past couple of days and may have over exerted herself. Pt denies nausea, cough, or fever. Pt sts she felt a little SOB with pain.

## 2021-03-25 NOTE — Plan of Care (Signed)
Will continue to monitor patients progress.

## 2021-03-25 NOTE — H&P (Signed)
History and Physical    MAISEN SCHMIT ZOX:[id number] DOB: October 02, 1941 DOA: 03/25/2021  PCP: Marinda Elk, MD (Confirm with patient/family/NH records and if not entered, this has to be entered at [facility] point of entry) Patient coming from: home  I have personally briefly reviewed patient's old medical records in [place]  Chief Complaint: generalized weakness, fatigue  HPI: Sherry Owens is a 80 y.o. female with medical history significant of GERD, depression, collagen vascular disease, HTN, arthritis, anxiety and angina with exertion who presents for assessment of 2 days or so of worsening general fatigue and weakness as well as some chest tightness today in the setting of doing for significant amount of yard work the last couple days and working a lot outside.  Patient think she has not been drinking enoughand limit drinking Pepsi.  She states she developed some chest pressure earlier today.  She states she has had this happen before but it is least been several years.  She denies any recent falls or injuries, headache, earache, sore throat, focal weakness, cough, shortness of breath, Donnell pain, nausea, vomiting, diarrhea or dysuria, rash or any other acute sick symptoms.  She states she took her GERD medication this morning but not her blood pressure medicine.     ED Course: 97.7  189/100  HR 59  RR 14. ED-P exam notable for dry mm. Cmet w/ glucose 113, Cr 1.20, Troponin 5, Total CK 156, TSH nl, CBCD nl, U/A large LE, no bacteria, 11-20 WBC/hpf. CT head - age indterminate lacunar infarct right basal ganglia, small vessel white matter disease. With marked weakness and difficulty ambulating TRH was called to admit for continued evaluation and treatment.  Review of Systems: As per HPI otherwise 10 point review of systems negative.    Past Medical History:  Diagnosis Date  . Anginal pain ([place]) 2019   currently has pain w/ stress  . Anxiety   . Arthritis   . Cancer ([place])     cancer within appendix per pt  . Collagen vascular disease ([facility])    RA  . Depression   . Genital herpes simplex   . GERD (gastroesophageal reflux disease)   . Hypertension   . Osteoporosis     Past Surgical History:  Procedure Laterality Date  . ABDOMINAL HYSTERECTOMY    . COLONOSCOPY    . COLONOSCOPY WITH PROPOFOL N/A 05/04/2018   Procedure: COLONOSCOPY WITH PROPOFOL;  Surgeon: Toledo, Benay Pike, MD;  Location: [facility];  Service: Gastroenterology;  Laterality: N/A;  . ESOPHAGOGASTRODUODENOSCOPY (EGD) WITH PROPOFOL N/A 05/04/2018   Procedure: ESOPHAGOGASTRODUODENOSCOPY (EGD) WITH PROPOFOL;  Surgeon: Toledo, Benay Pike, MD;  Location: [facility];  Service: Gastroenterology;  Laterality: N/A;  . 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 LAPAROSCOPIC RIGHT HEMI COLECTOMY Right 09/07/2018   Procedure: LAPAROSCOPIC RIGHT HEMI COLECTOMY;  Surgeon: Benjamine Sprague, DO;  Location: [facility] ORS;  Service: General;  Laterality: Right;  . OOPHORECTOMY    . TUBAL LIGATION      Soc Hx - 1st marriage 20 years ending in divorce. 2nd marriage - separated after after a short time and stayed apart from 69's until he died. She has 6 children, 12 grandchildren and 3 great-grandchildren. She worked in [employer] until she went out on disability 2/2 nervous breakdown. SHe lives in her own home with a female friend. Family is close by.    reports that she has never smoked. She has never used smokeless tobacco.  She reports that she does not drink alcohol and does not use drugs.  Allergies  Allergen Reactions  . Lactose Intolerance (Gi) Diarrhea    Stomach upset and diarrhea    Family History  Problem Relation Age of Onset  . Breast cancer Maternal Aunt   . Pancreatic cancer Mother   . Colon cancer Father   . Asthma Sister   . Pancreatic cancer Brother   . COPD Sister   . Hypertension Brother      Prior to Admission medications   Medication Sig Start Date End  Date Taking? Authorizing Provider  acetaminophen (TYLENOL) 325 MG tablet Take 2 tablets (650 mg total) by mouth every 6 (six) hours as needed for mild pain. 09/12/18   Lysle Pearl, Isami, DO  alendronate (FOSAMAX) 70 MG tablet Take 70 mg by mouth once a week. Take with a full glass of water on an empty stomach.    [provider]  escitalopram (LEXAPRO) 20 MG tablet Take 20 mg by mouth daily.    [provider]  hydrALAZINE (APRESOLINE) 25 MG tablet Take 25 mg by mouth 3 (three) times daily.    [provider]  HYDROcodone-acetaminophen (NORCO/VICODIN) 5-325 MG tablet Take 1 tablet by mouth every 6 (six) hours as needed for moderate pain. 09/12/18   Lysle Pearl, Isami, DO  isosorbide mononitrate (IMDUR) 30 MG 24 hr tablet Take 30 mg by mouth daily.    [provider]  lidocaine (LIDODERM) 5 % Place 1 patch onto the skin every 12 (twelve) hours. Remove & Discard patch within 12 hours or as directed by MD 06/14/20 06/14/21  Blake Divine, MD  LORazepam (ATIVAN) 0.5 MG tablet Take 0.5 mg by mouth daily at 8 pm.     [provider]  losartan (COZAAR) 50 MG tablet Take 50 mg by mouth daily.    [provider]  meloxicam (MOBIC) 15 MG tablet Take 15 mg by mouth daily.    [provider]  methocarbamol (ROBAXIN) 500 MG tablet Take 1 tablet (500 mg total) by mouth 4 (four) times daily. 10/30/20   Cuthriell, Charline Bills, PA-C  omeprazole (PRILOSEC) 20 MG capsule Take 20 mg by mouth daily.    [provider]  phenylephrine (SUDAFED PE) 10 MG TABS tablet Take 10 mg by mouth every 4 (four) hours as needed (for sinus drainage).    [provider]  predniSONE (DELTASONE) 50 MG tablet Take 1 tablet (50 mg total) by mouth daily with breakfast. 10/30/20   Cuthriell, Charline Bills, PA-C  valACYclovir (VALTREX) 500 MG tablet Take 500 mg by mouth daily.     [provider]  vitamin B-12 (CYANOCOBALAMIN) 1000 MCG tablet Take 1,000 mcg by mouth daily.     [provider]    Physical Exam: Vitals:   03/25/21 1830 03/25/21 1900 03/25/21 1930 03/25/21 2000  BP: (!) 184/84 (!) 189/96 (!) 189/100 (!) 164/92  Pulse: (!) 56 (!) 58 (!) 59 66  Resp:    (!) 21  Temp:      TempSrc:      SpO2: 97% 94% 96% 97%  Weight:      Height:         Vitals:   03/25/21 1830 03/25/21 1900 03/25/21 1930 03/25/21 2000  BP: (!) 184/84 (!) 189/96 (!) 189/100 (!) 164/92  Pulse: (!) 56 (!) 58 (!) 59 66  Resp:    (!) 21  Temp:      TempSrc:      SpO2:  97% 94% 96% 97%  Weight:      Height:       General: WNWD woman in no distress Eyes: PERRL, lids and conjunctivae normal ENMT: Mucous membranes are moist. Posterior pharynx clear of any exudate or lesions.Normal dentition.  Neck: normal, supple, no masses, no thyromegaly Respiratory: clear to auscultation bilaterally, no wheezing, no crackles. Normal respiratory effort. No accessory muscle use.  Cardiovascular: Regular rate and rhythm, soft II/VI murmur at LSB /no  rubs / no gallops. No extremity edema. 2+ pedal pulses. No carotid bruits.  Abdomen: no tenderness, no masses palpated. No hepatosplenomegaly. Bowel sounds positive.  Musculoskeletal: no clubbing / cyanosis. No joint deformity upper and lower extremities. Good ROM, no contractures. Normal muscle tone.  Skin: no rashes, lesions, ulcers. No induration Neurologic: CN 2-12 grossly intact. Sensation intact. Strength 5/5 in all 4. Did not stand or ambulate Psychiatric: Normal judgment and insight. Alert and oriented x 3. Normal mood.     Labs on Admission: I have personally reviewed following labs and imaging studies  CBC: Recent Labs  Lab 03/25/21 1643  WBC 4.1  NEUTROABS 2.2  HGB 13.3  HCT 39.9  MCV 97.8  PLT 789   Basic Metabolic Panel: Recent Labs  Lab 03/25/21 1643  NA 137  K 4.1  CL 108  CO2 23  GLUCOSE 113*  BUN 22  CREATININE 1.28*  CALCIUM 9.2  MG 2.3   GFR: Estimated Creatinine Clearance: 37.5 mL/min (A) (by  C-G formula based on SCr of 1.28 mg/dL (H)). Liver Function Tests: Recent Labs  Lab 03/25/21 1643  AST 32  ALT 16  ALKPHOS 97  BILITOT 0.9  PROT 7.7  ALBUMIN 3.7   No results for input(s): LIPASE, AMYLASE in the last 168 hours. No results for input(s): AMMONIA in the last 168 hours. Coagulation Profile: No results for input(s): INR, PROTIME in the last 168 hours. Cardiac Enzymes: Recent Labs  Lab 03/25/21 1643  CKTOTAL 156   BNP (last 3 results) No results for input(s): PROBNP in the last 8760 hours. HbA1C: No results for input(s): HGBA1C in the last 72 hours. CBG: No results for input(s): GLUCAP in the last 168 hours. Lipid Profile: No results for input(s): CHOL, HDL, LDLCALC, TRIG, CHOLHDL, LDLDIRECT in the last 72 hours. Thyroid Function Tests: Recent Labs    03/25/21 1643  TSH 0.445   Anemia Panel: No results for input(s): VITAMINB12, FOLATE, FERRITIN, TIBC, IRON, RETICCTPCT in the last 72 hours. Urine analysis:    Component Value Date/Time   COLORURINE YELLOW (A) 03/25/2021 1633   APPEARANCEUR CLEAR (A) 03/25/2021 1633   APPEARANCEUR Clear 01/09/2014 1145   LABSPEC 1.019 03/25/2021 1633   LABSPEC 1.015 01/09/2014 1145   PHURINE 5.0 03/25/2021 1633   GLUCOSEU NEGATIVE 03/25/2021 1633   GLUCOSEU Negative 01/09/2014 1145   HGBUR NEGATIVE 03/25/2021 1633   BILIRUBINUR NEGATIVE 03/25/2021 1633   BILIRUBINUR Negative 01/09/2014 [place] 03/25/2021 1633   PROTEINUR 30 (A) 03/25/2021 1633   NITRITE NEGATIVE 03/25/2021 1633   LEUKOCYTESUR LARGE (A) 03/25/2021 1633   LEUKOCYTESUR Negative 01/09/2014 1145    Radiological Exams on Admission: DG Chest 2 View  Result Date: 03/25/2021 CLINICAL DATA:  Chest pain. EXAM: CHEST - 2 VIEW COMPARISON:  CT abdomen pelvis 12/30/2019, chest x-ray 03/25/2018. CT chest 01/02/2009 FINDINGS: The heart size and mediastinal contours are within normal limits. No focal consolidation. No pulmonary edema. No pleural  effusion. No pneumothorax. No acute osseous abnormality. IMPRESSION: No active cardiopulmonary  disease. Electronically Signed   By: Iven Finn M.D.   On: 03/25/2021 17:52   CT Head Wo Contrast  Result Date: 03/25/2021 CLINICAL DATA:  Weakness EXAM: CT HEAD WITHOUT CONTRAST TECHNIQUE: Contiguous axial images were obtained from the base of the skull through the vertex without intravenous contrast. COMPARISON:  None. FINDINGS: Brain: No acute territorial infarction, hemorrhage or intracranial mass. Age indeterminate lacunar infarct in the right basal ganglia. Mild white matter hypodensity likely chronic small vessel ischemic change. Nonenlarged ventricles Vascular: No hyperdense vessels.  Carotid vascular calcification Skull: Normal. Negative for fracture or focal lesion. Sinuses/Orbits: No acute finding. Other: None IMPRESSION: 1. Age indeterminate lacunar infarct in the right basal ganglia. Otherwise no CT evidence for acute intracranial abnormality 2. Mild small vessel ischemic changes of the white matter. Electronically Signed   By: Donavan Foil M.D.   On: 03/25/2021 20:22    EKG: Independently reviewed. NSR, LVH, no acute changes  Assessment/Plan Active Problems:   Generalized weakness   HTN (hypertension)   Pyuria, sterile    1. Generalized weakness - was able to work in garden last two days. Admits to poor intake of fluids. Creatinine mildly elevated c/w prerenal azotemia.  No focal symptoms or findings. In ED she was able to ambulate to toilet. Question of old basal ganglia lacunar infarct with a h/o sudden falls due to ataxia but not recently.  Plan Hydrate - receiving 500 cc fluid bolus to be followed by 12 hours of IVF at 75cc/hr  Orthostatic vitals in AM  F/u Bmet in AM  PT eval  2. Sterile pyuria - has had sterile pyuria in past per records. U/A in ED w/o bacteria. No fever, chills, dysuria. No leukocytosis.  Plan No indication for continued Abx  3. HTN- followed as an  outpatient with adequate control. Did not take AM meds  Plan Continue home meds.    DVT prophylaxis: lovenox  Code Status: full code  Family Communication: at patients request called Marvis Moeller, [phone]. Cell # [phone]. Explained Dx and Tx plan.  Disposition Plan: home 24-48hrs  Consults called: none  Admission status: obs    Adella Hare MD [facility] Pager [phone]  If 7PM-7AM, please contact night-coverage [email or web address] Password [facility]  03/25/2021, 9:39 PM

## 2021-03-25 NOTE — ED Notes (Signed)
Pt returned from CT and is eating meal tray at this time.

## 2021-03-26 DIAGNOSIS — I10 Essential (primary) hypertension: Secondary | ICD-10-CM | POA: Diagnosis not present

## 2021-03-26 DIAGNOSIS — R531 Weakness: Secondary | ICD-10-CM | POA: Diagnosis not present

## 2021-03-26 LAB — BASIC METABOLIC PANEL
Anion gap: 9 (ref 5–15)
BUN: 19 mg/dL (ref 8–23)
CO2: 23 mmol/L (ref 22–32)
Calcium: 9.2 mg/dL (ref 8.9–10.3)
Chloride: 107 mmol/L (ref 98–111)
Creatinine, Ser: 1.21 mg/dL — ABNORMAL HIGH (ref 0.44–1.00)
GFR, Estimated: 45 mL/min — ABNORMAL LOW (ref >60)
Glucose, Bld: 106 mg/dL — ABNORMAL HIGH (ref 70–99)
Potassium: 3.7 mmol/L (ref 3.5–5.1)
Sodium: 139 mmol/L (ref 135–145)

## 2021-03-26 LAB — HEMOGLOBIN A1C
Hgb A1c MFr Bld: 5 % (ref 4.8–5.6)
Mean Plasma Glucose: 96.8 mg/dL

## 2021-03-26 LAB — SARS CORONAVIRUS 2 (TAT 6-24 HRS): SARS Coronavirus 2: NEGATIVE

## 2021-03-26 MED ORDER — POTASSIUM CHLORIDE CRYS ER 20 MEQ PO TBCR
40.0000 meq | EXTENDED_RELEASE_TABLET | Freq: Once | ORAL | Status: AC
  Administered 2021-03-26: 40 meq via ORAL
  Filled 2021-03-26: qty 2

## 2021-03-26 MED ORDER — HYDRALAZINE HCL 25 MG PO TABS: 25.0000 mg | ORAL_TABLET | Freq: Three times a day (TID) | ORAL | 1 refills | Status: DC

## 2021-03-26 NOTE — Discharge Summary (Signed)
Physician Discharge Summary  Sherry Owens XNA:[id number] DOB: 02/20/1941 DOA: 03/25/2021  PCP: Marinda Elk, MD  Admit date: 03/25/2021 Discharge date: 03/26/2021  Admitted From: home Disposition:  home  Recommendations for Outpatient Follow-up:  1. Follow up with PCP in 1-2 weeks 2. Please obtain BMP/CBC in one week 3. Please follow up with cardiology for Holter monitor  Home Health: PT, RN  Equipment/Devices: None   Discharge Condition: Stable  CODE STATUS: Full  Diet recommendation: Heart Healthy    Discharge Diagnoses: Active Problems:   HTN (hypertension)   Generalized weakness   Pyuria, sterile    Summary of HPI and Hospital Course:  Per H&P by Dr. Linda Hedges: "Sherry Owens is a 80 y.o. female with medical history significant of GERD, depression, collagen vascular disease, HTN, arthritis, anxiety and angina with exertion who presents for assessment of 2 days or so of worsening general fatigue and weakness as well as some chest tightness today in the setting of doing for significant amount of yard work the last couple days and working a lot outside. Patient think she has not been drinking enoughand limit drinking Pepsi. She states she developed some chest pressure earlier today. She states she has had this happen before but it is least been several years. She denies any recent falls or injuries, headache, earache, sore throat, focal weakness, cough, shortness of breath, [place]nell pain, nausea, vomiting, diarrhea or dysuria, rash or any other acute sick symptoms. She states she took her GERD medication this morning but not her blood pressure medicine.    ED Course: 97.7  189/100  HR 59  RR 14. ED-P exam notable for dry mm. Cmet w/ glucose 113, Cr 1.20, Troponin 5, Total CK 156, TSH nl, CBCD nl, U/A large LE, no bacteria, 11-20 WBC/hpf. CT head - age indterminate lacunar infarct right basal ganglia, small vessel white matter disease. With marked weakness and  difficulty ambulating TRH was called to admit for continued evaluation and treatment."   Generalized weakness -without any focal neurologic deficits.  Was tolerating garden work prior 2 days.  Possibly mild dehydration as patient reported poor oral intake of fluids recently and had mildly elevated creatinine.   Patient was hydrated with IV fluids.  Orthostatic vitals were negative. PT evaluated and recommended home health PT. Patient ambulatory and safe for discharge home with home health follow-up.  Sterile pyuria -past records reflect to this as well.  UA in the ED without bacteria.  Patient is afebrile no chills no dysuria.  No leukocytosis on CBC.  Monitor clinically.  Avoid antibiotics unless patient becomes symptomatic.  Hypertension -adequately controlled historically but had not taken her morning meds on day of admission so BP was uncontrolled at 189/100.  Improved with her regular medication regimen, no changes were made.  Nonsustained SVT -seen this morning on telemetry. Seen by cardiology.  Recommend outpatient follow up for ambulatory heart monitor, possible echocardiogram.  Maintain K>4.0 and Mg>2.0.   Discharge Instructions   Discharge Instructions    Call MD for:  extreme fatigue   Complete by: As directed    Call MD for:  persistant dizziness or light-headedness   Complete by: As directed    Call MD for:  persistant nausea and vomiting   Complete by: As directed    Call MD for:  severe uncontrolled pain   Complete by: As directed    Call MD for:  temperature >100.4   Complete by: As directed    Diet - low  sodium heart healthy   Complete by: As directed    Discharge instructions   Complete by: As directed    Please follow up with cardiology next week. They recommend doing a longer heart monitor that you wear for about 2 weeks so watch for abnormal heart rhythms.  We did not have to make any changes to your blood pressure medications. Please be sure to take all  medications exactly as prescribed.  If you have a blood pressure monitor at home, recommend checking your BP once daily at different times of day, write down the numbers to bring to follow up doctor's appointments.   Increase activity slowly   Complete by: As directed      Allergies as of 03/26/2021      Reactions   Lactose Intolerance (gi) Diarrhea   Stomach upset and diarrhea      Medication List    STOP taking these medications   HYDROcodone-acetaminophen 5-325 MG tablet Commonly known as: NORCO/VICODIN   methocarbamol 500 MG tablet Commonly known as: Robaxin   phenylephrine 10 MG Tabs tablet Commonly known as: SUDAFED PE   predniSONE 50 MG tablet Commonly known as: DELTASONE     TAKE these medications   acetaminophen 325 MG tablet Commonly known as: TYLENOL Take 2 tablets (650 mg total) by mouth every 6 (six) hours as needed for mild pain.   alendronate 70 MG tablet Commonly known as: FOSAMAX Take 70 mg by mouth once a week. Take with a full glass of water on an empty stomach.   escitalopram 20 MG tablet Commonly known as: LEXAPRO Take 20 mg by mouth daily.   hydrALAZINE 25 MG tablet Commonly known as: APRESOLINE Take 1 tablet (25 mg total) by mouth 3 (three) times daily.   isosorbide mononitrate 30 MG 24 hr tablet Commonly known as: IMDUR Take 30 mg by mouth daily.   lidocaine 5 % Commonly known as: Lidoderm Place 1 patch onto the skin every 12 (twelve) hours. Remove & Discard patch within 12 hours or as directed by MD   LORazepam 0.5 MG tablet Commonly known as: ATIVAN Take 0.5 mg by mouth daily at 8 pm.   losartan 50 MG tablet Commonly known as: COZAAR Take 50 mg by mouth daily.   meloxicam 15 MG tablet Commonly known as: MOBIC Take 15 mg by mouth daily.   omeprazole 20 MG capsule Commonly known as: PRILOSEC Take 20 mg by mouth daily.   valACYclovir 500 MG tablet Commonly known as: VALTREX Take 500 mg by mouth daily.   vitamin B-12 1000  MCG tablet Commonly known as: CYANOCOBALAMIN Take 1,000 mcg by mouth daily.       Follow-up Information    Teodoro Spray, MD. Go on 04/03/2021.   Specialty: Cardiology Why: @ 9:45am Contact information: [place] [address] [address], [facility] Follow up.   Specialty: Home Health Services Why: Jackquline [facility] will call to schedule your first visit.  They will not be able to come out until the weekend.  If you do not hear from them please call [place] with [facility] at [phone]. Contact information: [place] [address] [phone]              Allergies  Allergen Reactions  . Lactose Intolerance (Gi) Diarrhea    Stomach upset and diarrhea     If you experience worsening of your admission symptoms, develop  shortness of breath, life threatening emergency, suicidal or homicidal thoughts you must seek medical attention immediately by calling 911 or calling your MD immediately  if symptoms less severe.    Please note   You were cared for by a hospitalist during your hospital stay. If you have any questions about your discharge medications or the care you received while you were in the hospital after you are discharged, you can call the unit and asked to speak with the hospitalist on call if the hospitalist that took care of you is not available. Once you are discharged, your primary care physician will handle any further medical issues. Please note that NO REFILLS for any discharge medications will be authorized once you are discharged, as it is imperative that you return to your primary care physician (or establish a relationship with a primary care physician if you do not have one) for your aftercare needs so that they can reassess your need for medications and monitor your lab values.   Consultations:  Cardiology   Procedures/Studies: DG Chest 2 View  Result Date:  03/25/2021 CLINICAL DATA:  Chest pain. EXAM: CHEST - 2 VIEW COMPARISON:  CT abdomen pelvis 12/30/2019, chest x-ray 03/25/2018. CT chest 01/02/2009 FINDINGS: The heart size and mediastinal contours are within normal limits. No focal consolidation. No pulmonary edema. No pleural effusion. No pneumothorax. No acute osseous abnormality. IMPRESSION: No active cardiopulmonary disease. Electronically Signed   By: Iven Finn M.D.   On: 03/25/2021 17:52   CT Head Wo Contrast  Result Date: 03/25/2021 CLINICAL DATA:  Weakness EXAM: CT HEAD WITHOUT CONTRAST TECHNIQUE: Contiguous axial images were obtained from the base of the skull through the vertex without intravenous contrast. COMPARISON:  None. FINDINGS: Brain: No acute territorial infarction, hemorrhage or intracranial mass. Age indeterminate lacunar infarct in the right basal ganglia. Mild white matter hypodensity likely chronic small vessel ischemic change. Nonenlarged ventricles Vascular: No hyperdense vessels.  Carotid vascular calcification Skull: Normal. Negative for fracture or focal lesion. Sinuses/Orbits: No acute finding. Other: None IMPRESSION: 1. Age indeterminate lacunar infarct in the right basal ganglia. Otherwise no CT evidence for acute intracranial abnormality 2. Mild small vessel ischemic changes of the white matter. Electronically Signed   By: Donavan Foil M.D.   On: 03/25/2021 20:22       Subjective: Patient seen awake sitting up in bed this morning.  Denies any further chest pain.  No other acute complaints including fever chills, dysuria, nausea vomiting, abdominal pain, shortness of breath.  She denied feeling any palpitations earlier this morning but says she gets them at home from time to time.   Discharge Exam: Vitals:   03/26/21 1201 03/26/21 1256  BP: (!) 187/95 (!) 156/88  Pulse: 60 68  Resp: 18 18  Temp: (!) 97.5 F (36.4 C)   SpO2: 97% 96%   Vitals:   03/26/21 0709 03/26/21 1026 03/26/21 1201 03/26/21 1256  BP:  (!) 157/88 (!) 167/93 (!) 187/95 (!) 156/88  Pulse: (!) 58  60 68  Resp: 16  18 18   Temp: 98 F (36.7 C)  (!) 97.5 F (36.4 C)   TempSrc:      SpO2: 94%  97% 96%  Weight:      Height:        General: Pt is alert, awake, not in acute distress Cardiovascular: RRR, S1/S2 +, no rubs, no gallops Respiratory: CTA bilaterally, no wheezing, no rhonchi Abdominal: Soft, NT, ND, bowel sounds + Extremities: no edema, no cyanosis  The results of significant diagnostics from this hospitalization (including imaging, microbiology, ancillary and laboratory) are listed below for reference.     Microbiology: Recent Results (from the past 240 hour(s))  SARS CORONAVIRUS 2 (TAT 6-24 HRS) Nasopharyngeal Nasopharyngeal Swab     Status: None   Collection Time: 03/25/21  4:34 PM   Specimen: Nasopharyngeal Swab  Result Value Ref Range Status   SARS Coronavirus 2 NEGATIVE NEGATIVE Final    Comment: (NOTE) SARS-CoV-2 target nucleic acids are NOT DETECTED.  The SARS-CoV-2 RNA is generally detectable in upper and lower respiratory specimens during the acute phase of infection. Negative results do not preclude SARS-CoV-2 infection, do not rule out co-infections with other pathogens, and should not be used as the sole basis for treatment or other patient management decisions. Negative results must be combined with clinical observations, patient history, and epidemiological information. The expected result is Negative.  Fact Sheet for Patients: SugarRoll.be  Fact Sheet for Healthcare Providers: https://www.woods-mathews.com/  This test is not yet approved or cleared by the [place] FDA and  has been authorized for detection and/or diagnosis of SARS-CoV-2 by FDA under an Emergency Use Authorization (EUA). This EUA will remain  in effect (meaning this test can be used) for the duration of the COVID-19 declaration under Se ction 564(b)(1) of the Act, 21  U.S.C. section 360bbb-3(b)(1), unless the authorization is terminated or revoked sooner.  Performed at [facility], [place] [address], [place], [place] [address]      Labs: BNP (last 3 results) No results for input(s): BNP in the last 8760 hours. Basic Metabolic Panel: Recent Labs  Lab 03/25/21 1643 03/26/21 0445  NA 137 139  K 4.1 3.7  CL 108 107  CO2 23 23  GLUCOSE 113* 106*  BUN 22 19  CREATININE 1.28* 1.21*  CALCIUM 9.2 9.2  MG 2.3  --    Liver Function Tests: Recent Labs  Lab 03/25/21 1643  AST 32  ALT 16  ALKPHOS 97  BILITOT 0.9  PROT 7.7  ALBUMIN 3.7   No results for input(s): LIPASE, AMYLASE in the last 168 hours. No results for input(s): AMMONIA in the last 168 hours. CBC: Recent Labs  Lab 03/25/21 1643  WBC 4.1  NEUTROABS 2.2  HGB 13.3  HCT 39.9  MCV 97.8  PLT 153   Cardiac Enzymes: Recent Labs  Lab 03/25/21 1643  CKTOTAL 156   BNP: Invalid input(s): POCBNP CBG: No results for input(s): GLUCAP in the last 168 hours. D-Dimer No results for input(s): DDIMER in the last 72 hours. Hgb A1c Recent Labs    03/25/21 1643  HGBA1C 5.0   Lipid Profile No results for input(s): CHOL, HDL, LDLCALC, TRIG, CHOLHDL, LDLDIRECT in the last 72 hours. Thyroid function studies Recent Labs    03/25/21 1643  TSH 0.445   Anemia work up No results for input(s): VITAMINB12, FOLATE, FERRITIN, TIBC, IRON, RETICCTPCT in the last 72 hours. Urinalysis    Component Value Date/Time   COLORURINE YELLOW (A) 03/25/2021 1633   APPEARANCEUR CLEAR (A) 03/25/2021 1633   APPEARANCEUR Clear 01/09/2014 1145   LABSPEC 1.019 03/25/2021 1633   LABSPEC 1.015 01/09/2014 1145   PHURINE 5.0 03/25/2021 1633   GLUCOSEU NEGATIVE 03/25/2021 1633   GLUCOSEU Negative 01/09/2014 1145   HGBUR NEGATIVE 03/25/2021 [place] 03/25/2021 1633   BILIRUBINUR Negative 01/09/2014 [place] 03/25/2021 1633   PROTEINUR 30 (A) 03/25/2021 1633    NITRITE NEGATIVE 03/25/2021 1633  LEUKOCYTESUR LARGE (A) 03/25/2021 1633   LEUKOCYTESUR Negative 01/09/2014 1145   Sepsis Labs Invalid input(s): PROCALCITONIN,  WBC,  LACTICIDVEN Microbiology Recent Results (from the past 240 hour(s))  SARS CORONAVIRUS 2 (TAT 6-24 HRS) Nasopharyngeal Nasopharyngeal Swab     Status: None   Collection Time: 03/25/21  4:34 PM   Specimen: Nasopharyngeal Swab  Result Value Ref Range Status   SARS Coronavirus 2 NEGATIVE NEGATIVE Final    Comment: (NOTE) SARS-CoV-2 target nucleic acids are NOT DETECTED.  The SARS-CoV-2 RNA is generally detectable in upper and lower respiratory specimens during the acute phase of infection. Negative results do not preclude SARS-CoV-2 infection, do not rule out co-infections with other pathogens, and should not be used as the sole basis for treatment or other patient management decisions. Negative results must be combined with clinical observations, patient history, and epidemiological information. The expected result is Negative.  Fact Sheet for Patients: SugarRoll.be  Fact Sheet for Healthcare Providers: https://www.woods-mathews.com/  This test is not yet approved or cleared by the [place] FDA and  has been authorized for detection and/or diagnosis of SARS-CoV-2 by FDA under an Emergency Use Authorization (EUA). This EUA will remain  in effect (meaning this test can be used) for the duration of the COVID-19 declaration under Se ction 564(b)(1) of the Act, 21 U.S.C. section 360bbb-3(b)(1), unless the authorization is terminated or revoked sooner.  Performed at [facility], [place] [address] [place], [place] [address]      Time coordinating discharge: Over 30 minutes  SIGNED:   Ezekiel Slocumb, DO [facility] 03/26/2021, 5:10 PM   If 7PM-7AM, please contact night-coverage [email or web address]

## 2021-03-26 NOTE — Care Management Obs Status (Signed)
[place] NOTIFICATION   Patient Details  Name: Sherry Owens MRN: [id number] Date of Birth: 1940/12/21   Medicare Observation Status Notification Given:  Yes    Shelbie Hutching, RN 03/26/2021, 10:26 AM

## 2021-03-26 NOTE — Progress Notes (Signed)
Discharge paperwork reviewed with patient. All questions answered. Verbalizes understanding of discharge plan, medication compliance, and follow-up care.

## 2021-03-26 NOTE — TOC Transition Note (Signed)
Transition of Care [facility]) - CM/SW Discharge Note   Patient Details  Name: BRANDI TOMLINSON MRN: [id number] Date of Birth: 04/29/1941  Transition of Care [facility]) CM/SW Contact:  Shelbie Hutching, RN Phone Number: 03/26/2021, 3:10 PM   Clinical Narrative:    Patient medically stable for discharge home this afternoon.  [place] with [facility] has accepted referral for RN, and PT home health.  [facility] will not be able to see patient until the weekend.  Unable to find another agency that could see patient sooner.   Patient's family will transport her home today.   Final next level of care: [place] Barriers to Discharge: Barriers Resolved   Patient Goals and CMS Choice Patient states their goals for this hospitalization and ongoing recovery are:: Hopes to go home this afternoon CMS Medicare.gov Compare Post Acute Care list provided to:: Patient Choice offered to / list presented to : Patient  Discharge Placement                       Discharge Plan and Services   Discharge Planning Services: CM Consult Post Acute Care Choice: Home Health          DME Arranged: N/A DME Agency: NA       HH Arranged: RN,PT [place] Agency: [facility] Date [place]: 03/26/21 Time [place]: [id number] Representative spoke with at [place]: [place] ([place]) Interventions     Readmission Risk Interventions No flowsheet data found.

## 2021-03-26 NOTE — TOC Initial Note (Signed)
Transition of Care [facility]) - Initial/Assessment Note    Patient Details  Name: Sherry Owens MRN: [id number] Date of Birth: [birth year]  [facility]) CM/SW Contact:    Shelbie Hutching, RN Phone Number: 03/26/2021, 11:03 AM  Clinical Narrative:                 Patient placed under observation for leg weakness and chest pain.  RNCM met with patient at the bedside to deliver MOON and complete assessment.  Patient is from home where she lives alone.  Patient is usually independent and uses a cane at times.  Patient does not drive but her son provides transportation.  Patient is current with PCP Dr. Carrie Mew and uses [facility] [place] for [facility].  PT is recommending home health services and patient agrees.  RNCM will reach out to agencies to find someone to accept.  Patient has no preference in agency.    Expected Discharge Plan: [place] Barriers to Discharge: Continued Medical Work up   Patient Goals and CMS Choice Patient states their goals for this hospitalization and ongoing recovery are:: Hopes to go home this afternoon CMS Medicare.gov Compare Post Acute Care list provided to:: Patient Choice offered to / list presented to : Patient  Expected Discharge Plan and Services Expected Discharge Plan: [place]   Discharge Planning Services: CM Consult Post Acute Care Choice: [place] arrangements for the past 2 months: Single Family Home                 DME Arranged: N/A DME Agency: NA       HH Arranged: RN,PT          Prior Living Arrangements/Services Living arrangements for the past 2 months: Single Family Home Lives with:: Self Patient language and need for interpreter reviewed:: Yes Do you feel safe going back to the place where you live?: Yes      Need for Family Participation in Patient Care: Yes (Comment) (HTN, leg weakness) Care giver support system in place?: Yes (comment) (family lives close  by) Current home services: DME (cane) Criminal Activity/Legal Involvement Pertinent to Current Situation/Hospitalization: No - Comment as needed  Activities of Daily Living Home Assistive Devices/Equipment: Cane (specify quad or straight),Shower chair with back,Grab bars around toilet,Grab bars in shower ADL Screening (condition at time of admission) Patient's cognitive ability adequate to safely complete daily activities?: Yes Is the patient deaf or have difficulty hearing?: No Does the patient have difficulty seeing, even when wearing glasses/contacts?: No Does the patient have difficulty concentrating, remembering, or making decisions?: No Patient able to express need for assistance with ADLs?: Yes Does the patient have difficulty dressing or bathing?: No Independently performs ADLs?: Yes (appropriate for developmental age) Does the patient have difficulty walking or climbing stairs?: Yes Weakness of Legs: Both (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) Weakness of Arms/Hands: Both  Permission Sought/Granted Permission sought to share information with : Case Manager,Facility Contact Representative,Family Supports Permission granted to share information with : Yes, Verbal Permission Granted  Share Information with NAME: Juanda Crumble  Permission granted to share info w AGENCY: home health agencies  Permission granted to share info w Relationship: son     Emotional Assessment Appearance:: Appears stated age Attitude/Demeanor/Rapport: Engaged Affect (typically observed): Accepting Orientation: : Oriented to Self,Oriented to Place,Oriented to  Time,Oriented to Situation Alcohol / Substance Use: Not Applicable Psych Involvement: No (comment)  Admission diagnosis:  Acute cystitis without hematuria [N30.00]  Generalized weakness [R53.1] 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 HTN (hypertension) 03/25/2021  . Generalized weakness 03/25/2021  . Pyuria, sterile 03/25/2021  . Cancer ([place])   . 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: [phone]: [phone]    Social Determinants of Health (SDOH) Interventions    Readmission Risk Interventions No flowsheet data found.

## 2021-03-26 NOTE — Consult Note (Signed)
Cardiology Consultation Note    Patient ID: Sherry Owens, MRN: [id number], DOB/AGE: Jan 18, 1941 80 y.o. Admit date: 03/25/2021   Date of Consult: 03/26/2021 Primary Physician: Marinda Elk, MD Primary Cardiologist: none  Chief Complaint: tachycardia and hypertension Reason for Consultation: svt Requesting MD: Dr. Nicole Kindred  HPI: Sherry Owens is a 80 y.o. female with history of depression, hypertension, anxiety, angina who was admitted after presenting with worsening fatigue and weakness and chest tightness.  This occurred while doing some yard work.  She states it has happened before.  She started to have sinus rhythm with no ischemia in the emergency room.  Troponin was normal.  Total CK was 156.  She was relatively hypertensive.  Head CT revealed age-indeterminate lacunar infarct with right basal ganglial and small vessel white matter disease.  As an outpatient she was treated with Lexapro, hydralazine 25 3 times daily, Imdur 30 daily, losartan 50 mg daily, Sudafed 10 mg every 4 hours as needed.  Since admission she has had improved blood pressure heart rate in the low 60s.She denies episodes of syncope. On telemetry, there have been episodes on nonsustained narrow complex svt. Asymptomatic.   Past Medical History:  Diagnosis Date  . Anginal pain ([place]) 2019   currently has pain w/ stress  . Anxiety   . Arthritis   . Cancer ([place])    cancer within appendix per pt  . Collagen vascular disease ([facility])    RA  . Depression   . Genital herpes simplex   . GERD (gastroesophageal reflux disease)   . Hypertension   . Osteoporosis       Surgical History:  Past Surgical History:  Procedure Laterality Date  . ABDOMINAL HYSTERECTOMY    . COLONOSCOPY    . COLONOSCOPY WITH PROPOFOL N/A 05/04/2018   Procedure: COLONOSCOPY WITH PROPOFOL;  Surgeon: Toledo, Benay Pike, MD;  Location: [facility]COPY;  Service: Gastroenterology;  Laterality: N/A;  . ESOPHAGOGASTRODUODENOSCOPY (EGD)  WITH PROPOFOL N/A 05/04/2018   Procedure: ESOPHAGOGASTRODUODENOSCOPY (EGD) WITH PROPOFOL;  Surgeon: Toledo, Benay Pike, MD;  Location: [facility] END[facility]COPY;  Service: Gastroenterology;  Laterality: N/A;  . 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 LAPAROSCOPIC RIGHT HEMI COLECTOMY Right 09/07/2018   Procedure: LAPAROSCOPIC RIGHT HEMI COLECTOMY;  Surgeon: Benjamine Sprague, DO;  Location: [facility] ORS;  Service: General;  Laterality: Right;  . OOPHORECTOMY    . TUBAL LIGATION       Home Meds: Prior to Admission medications   Medication Sig Start Date End Date Taking? Authorizing Provider  acetaminophen (TYLENOL) 325 MG tablet Take 2 tablets (650 mg total) by mouth every 6 (six) hours as needed for mild pain. 09/12/18   Lysle Pearl, Isami, DO  alendronate (FOSAMAX) 70 MG tablet Take 70 mg by mouth once a week. Take with a full glass of water on an empty stomach.    [provider]  escitalopram (LEXAPRO) 20 MG tablet Take 20 mg by mouth daily.    [provider]  hydrALAZINE (APRESOLINE) 25 MG tablet Take 25 mg by mouth 3 (three) times daily.    [provider]  HYDROcodone-acetaminophen (NORCO/VICODIN) 5-325 MG tablet Take 1 tablet by mouth every 6 (six) hours as needed for moderate pain. 09/12/18   Lysle Pearl, Isami, DO  isosorbide mononitrate (IMDUR) 30 MG 24 hr tablet Take 30 mg by mouth daily.    [provider]  lidocaine (LIDODERM) 5 % Place 1 patch onto the skin every 12 (twelve)  hours. Remove & Discard patch within 12 hours or as directed by MD 06/14/20 06/14/21  Blake Divine, MD  LORazepam (ATIVAN) 0.5 MG tablet Take 0.5 mg by mouth daily at 8 pm.     [provider]  losartan (COZAAR) 50 MG tablet Take 50 mg by mouth daily.    [provider]  meloxicam (MOBIC) 15 MG tablet Take 15 mg by mouth daily.    [provider]  methocarbamol (ROBAXIN) 500 MG tablet Take 1 tablet (500 mg total) by mouth 4 (four) times daily. 10/30/20    Cuthriell, Charline Bills, PA-C  omeprazole (PRILOSEC) 20 MG capsule Take 20 mg by mouth daily.    [provider]  phenylephrine (SUDAFED PE) 10 MG TABS tablet Take 10 mg by mouth every 4 (four) hours as needed (for sinus drainage).    [provider]  predniSONE (DELTASONE) 50 MG tablet Take 1 tablet (50 mg total) by mouth daily with breakfast. 10/30/20   Cuthriell, Charline Bills, PA-C  valACYclovir (VALTREX) 500 MG tablet Take 500 mg by mouth daily.     [provider]  vitamin B-12 (CYANOCOBALAMIN) 1000 MCG tablet Take 1,000 mcg by mouth daily.    [provider]    Inpatient Medications:  . enoxaparin (LOVENOX) injection  40 mg Subcutaneous Q24H  . escitalopram  20 mg Oral Daily  . hydrALAZINE  25 mg Oral TID  . isosorbide mononitrate  30 mg Oral Daily  . LORazepam  0.5 mg Oral Q2000  . losartan  50 mg Oral Daily  . meloxicam  15 mg Oral Daily  . pantoprazole  40 mg Oral Daily  . potassium chloride  40 mEq Oral Once  . valACYclovir  500 mg Oral Daily  . vitamin B-12  1,000 mcg Oral Daily     Allergies:  Allergies  Allergen Reactions  . Lactose Intolerance (Gi) Diarrhea    Stomach upset and diarrhea    Social History   Socioeconomic History  . Marital status: Single    Spouse name: Not on file  . Number of children: Not on file  . Years of education: Not on file  . Highest education level: Not on file  Occupational History  . Occupation: Armed forces logistics/support/administrative officer    Comment: retired / disability  Tobacco Use  . Smoking status: Never Smoker  . Smokeless tobacco: Never Used  Vaping Use  . Vaping Use: Never used  Substance and Sexual Activity  . Alcohol use: No  . Drug use: Never  . Sexual activity: Not on file  Other Topics Concern  . Not on file  Social History Narrative  . Not on file   Social Determinants of Health   Financial Resource Strain: Not on file  Food Insecurity: Not on file  Transportation Needs: Not on file  Physical  Activity: Not on file  Stress: Not on file  Social Connections: Not on file  Intimate Partner Violence: Not on file     Family History  Problem Relation Age of Onset  . Breast cancer Maternal Aunt   . Pancreatic cancer Mother   . Colon cancer Father   . Asthma Sister   . Pancreatic cancer Brother   . COPD Sister   . Hypertension Brother      Review of Systems: A 12-system review of systems was performed and is negative except as noted in the HPI.  Labs: Recent Labs    03/25/21 1643  CKTOTAL 156   Lab Results  Component Value Date   WBC 4.1 03/25/2021   HGB 13.3 03/25/2021   HCT 39.9 03/25/2021   MCV 97.8 03/25/2021   PLT 153 03/25/2021    Recent Labs  Lab 03/25/21 1643 03/26/21 0445  NA 137 139  K 4.1 3.7  CL 108 107  CO2 23 23  BUN 22 19  CREATININE 1.28* 1.21*  CALCIUM 9.2 9.2  PROT 7.7  --   BILITOT 0.9  --   ALKPHOS 97  --   ALT 16  --   AST 32  --   GLUCOSE 113* 106*   No results found for: CHOL, HDL, LDLCALC, TRIG No results found for: DDIMER  Radiology/Studies:  DG Chest 2 View  Result Date: 03/25/2021 CLINICAL DATA:  Chest pain. EXAM: CHEST - 2 VIEW COMPARISON:  CT abdomen pelvis 12/30/2019, chest x-ray 03/25/2018. CT chest 01/02/2009 FINDINGS: The heart size and mediastinal contours are within normal limits. No focal consolidation. No pulmonary edema. No pleural effusion. No pneumothorax. No acute osseous abnormality. IMPRESSION: No active cardiopulmonary disease. Electronically Signed   By: Iven Finn M.D.   On: 03/25/2021 17:52   CT Head Wo Contrast  Result Date: 03/25/2021 CLINICAL DATA:  Weakness EXAM: CT HEAD WITHOUT CONTRAST TECHNIQUE: Contiguous axial images were obtained from the base of the skull through the vertex without intravenous contrast. COMPARISON:  None. FINDINGS: Brain: No acute territorial infarction, hemorrhage or intracranial mass. Age indeterminate lacunar infarct in the right basal ganglia. Mild white matter  hypodensity likely chronic small vessel ischemic change. Nonenlarged ventricles Vascular: No hyperdense vessels.  Carotid vascular calcification Skull: Normal. Negative for fracture or focal lesion. Sinuses/Orbits: No acute finding. Other: None IMPRESSION: 1. Age indeterminate lacunar infarct in the right basal ganglia. Otherwise no CT evidence for acute intracranial abnormality 2. Mild small vessel ischemic changes of the white matter. Electronically Signed   By: Donavan Foil M.D.   On: 03/25/2021 20:22    Wt Readings from Last 3 Encounters:  03/25/21 83.9 kg  10/30/20 81.6 kg  06/14/20 84.8 kg    EKG: Sinus rhythm with no ischemia  Physical Exam:  Blood pressure (!) 167/93, pulse (!) 58, temperature 98 F (36.7 C), resp. rate 16, height 5\' 5"  (1.651 m), weight 83.9 kg, SpO2 94 %. Body mass index is 30.79 kg/m. General: Well developed, well nourished, in no acute distress. Head: Normocephalic, atraumatic, sclera non-icteric, no xanthomas, nares are without discharge.  Neck: Negative for carotid bruits. JVD not elevated. Lungs: Clear bilaterally to auscultation without wheezes, rales, or rhonchi. Breathing is unlabored. Heart: RRR with S1 S2. No murmurs, rubs, or gallops appreciated. Abdomen: Soft, non-tender, non-distended with normoactive bowel sounds. No hepatomegaly. No rebound/guarding. No obvious abdominal masses. Msk:  Strength and tone appear normal for age. Extremities: No clubbing or cyanosis. No edema.  Distal pedal pulses are 2+ and equal bilaterally. Neuro: Alert and oriented X 3. No facial asymmetry. No focal deficit. Moves all extremities spontaneously. Psych:  Responds to questions appropriately with a normal affect.     Assessment and Plan  80 year old female with dyspnea and generalized weakness.  Had poor intake of fluids.  Renal function suggested volume depletion.  Head CT was unremarkable.  Blood pressure has improved somewhat.  1.  Hypertension continue with  treatment of her blood pressure.  Low-sodium diet as outpatient.  We will continue with current regimen.  Hydrate daily and low-sodium diet.  We will follow as an outpatient  2.  SVT-nonsustained.  K was borderline  low at 3.7.  Agree with repletion to a K greater than 4 and magnesium greater than 2.  Would not make any other changes at present as these are nonsustained runs and her resting heart rate is relatively low.  We will follow-up as outpatient and consider Holter monitor to determine the extent of her arrhythmia as an outpatient.  Okay for discharge from a cardiac standpoint.  Ivin Booty MD 03/26/2021, 10:44 AM Pager: [phone]

## 2021-03-26 NOTE — Evaluation (Signed)
Physical Therapy Evaluation Patient Details Name: Sherry Owens MRN: [id number] DOB: 04-13-1941 Today's Date: 03/26/2021   History of Present Illness  80 y.o. female with medical history significant of GERD, depression, collagen vascular disease, HTN, arthritis, anxiety and angina with exertion who presents for assessment of 2 days or so of worsening general fatigue and weakness as well as some chest tightness today in the setting of doing for significant amount of yard work the last couple days and working a lot outside.  Patient think she has not been drinking enough.  Pt with weakness, chest pressure but feeling much better at time of PT exam.  Clinical Impression  Pt did well with mobility and ambulation.  She was able to slowly, but safely circumambulate the nurses' station and negotiate up/down steps.  She reports still feeling somewhat weak and slower than her baseline, and did show some minimal guarding at times, but no LOBs and stable vitals.  Pt will benefit from PT at home to continue transition back to her independent baseline.      Follow Up Recommendations Home health PT    Equipment Recommendations  None recommended by PT    Recommendations for Other Services       Precautions / Restrictions Precautions Precautions: Fall Restrictions Weight Bearing Restrictions: No      Mobility  Bed Mobility Overal bed mobility: Modified Independent             General bed mobility comments: Pt easily gets to sitting EOB w/o assist    Transfers Overall transfer level: Modified independent Equipment used: Rolling walker (2 wheeled)             General transfer comment: Pt is able to rise to standing w/o assist, appropraite use of UEs and minimal reliance on the walker  Ambulation/Gait Ambulation/Gait assistance: Supervision Gait Distance (Feet): 250 Feet Assistive device: Rolling walker (2 wheeled);None       General Gait Details: Pt was able to maintain  consistent cadence for prolonged bout of ambulation, however reports feeling much slower and hesitant than baseline.  She did use walker for ~50 ft but was able to transition to no UE support with good relative safety.  Her O2 remained in the high 90s, HR occasionally spiking above 100, but generally staying in the 70-90 bpm.  Stairs Stairs: Yes Stairs assistance: Min guard Stair Management: One rail Right Number of Stairs: 4 General stair comments: Pt was able to ascend/descend steps without assist, minimal cuing.  Wheelchair Mobility    Modified Rankin (Stroke Patients Only)       Balance Overall balance assessment: Modified Independent                                           Pertinent Vitals/Pain Pain Assessment: No/denies pain    Home Living Family/patient expects to be discharged to:: Private residence Living Arrangements: Alone Available Help at Discharge: Family;Neighbor   Home Access: Stairs to enter Entrance Stairs-Rails:  (yes) Entrance Stairs-Number of Steps: 3   Home Equipment: Cane - single point (she's unsure if she has a walker)      Prior Function Level of Independence: Independent         Comments: Pt's children to driving/community errands, but she manages in-home ADLs, etc with independently     Hand Dominance        Extremity/Trunk Assessment  Upper Extremity Assessment Upper Extremity Assessment: Overall WFL for tasks assessed    Lower Extremity Assessment Lower Extremity Assessment: Overall WFL for tasks assessed       Communication   Communication: No difficulties  Cognition Arousal/Alertness: Awake/alert Behavior During Therapy: WFL for tasks assessed/performed Overall Cognitive Status: Within Functional Limits for tasks assessed                                        General Comments      Exercises     Assessment/Plan    PT Assessment Patient needs continued PT services  PT  Problem List Decreased strength;Decreased range of motion;Decreased activity tolerance;Decreased balance;Decreased mobility;Decreased knowledge of use of DME;Decreased safety awareness;Cardiopulmonary status limiting activity       PT Treatment Interventions DME instruction;Gait training;Stair training;Functional mobility training;Therapeutic activities;Therapeutic exercise;Balance training;Neuromuscular re-education;Patient/family education    PT Goals (Current goals can be found in the Care Plan section)  Acute Rehab PT Goals Patient Stated Goal: go home PT Goal Formulation: With patient Time For Goal Achievement: 04/09/21 Potential to Achieve Goals: Good    Frequency Min 2X/week   Barriers to discharge        Co-evaluation               AM-PAC PT "6 Clicks" Mobility  Outcome Measure Help needed turning from your back to your side while in a flat bed without using bedrails?: None Help needed moving from lying on your back to sitting on the side of a flat bed without using bedrails?: None Help needed moving to and from a bed to a chair (including a wheelchair)?: None Help needed standing up from a chair using your arms (e.g., wheelchair or bedside chair)?: None Help needed to walk in hospital room?: None Help needed climbing 3-5 steps with a railing? : None 6 Click Score: 24    End of Session Equipment Utilized During Treatment: Gait belt Activity Tolerance: Patient tolerated treatment well Patient left: with call bell/phone within reach;with chair alarm set Nurse Communication: Mobility status PT Visit Diagnosis: Muscle weakness (generalized) (M62.81);Difficulty in walking, not elsewhere classified (R26.2)    Time: 7342-8768 PT Time Calculation (min) (ACUTE ONLY): 28 min   Charges:   PT Evaluation $PT Eval Low Complexity: 1 Low PT Treatments $Gait Training: 8-22 mins        Kreg Shropshire, DPT 03/26/2021, 12:45 PM

## 2021-03-27 LAB — URINE CULTURE

## 2021-05-01 ENCOUNTER — Emergency Department
Admission: EM | Admit: 2021-05-01 | Discharge: 2021-05-01 | Disposition: A | Discharge status: Home / Self Care | Payer: Medicare Other | Attending: Emergency Medicine | Admitting: Emergency Medicine

## 2021-05-01 ENCOUNTER — Emergency Department: Payer: Medicare Other

## 2021-05-01 ENCOUNTER — Other Ambulatory Visit: Payer: Self-pay

## 2021-05-01 DIAGNOSIS — M25551 Pain in right hip: Secondary | ICD-10-CM | POA: Diagnosis present

## 2021-05-01 DIAGNOSIS — I10 Essential (primary) hypertension: Secondary | ICD-10-CM | POA: Insufficient documentation

## 2021-05-01 DIAGNOSIS — Z8509 Personal history of malignant neoplasm of other digestive organs: Secondary | ICD-10-CM | POA: Diagnosis not present

## 2021-05-01 DIAGNOSIS — Z79899 Other long term (current) drug therapy: Secondary | ICD-10-CM | POA: Insufficient documentation

## 2021-05-01 DIAGNOSIS — W010XXA Fall on same level from slipping, tripping and stumbling without subsequent striking against object, initial encounter: Secondary | ICD-10-CM | POA: Diagnosis not present

## 2021-05-01 DIAGNOSIS — Z96653 Presence of artificial knee joint, bilateral: Secondary | ICD-10-CM | POA: Insufficient documentation

## 2021-05-01 DIAGNOSIS — M533 Sacrococcygeal disorders, not elsewhere classified: Secondary | ICD-10-CM | POA: Insufficient documentation

## 2021-05-01 MED ORDER — TRAMADOL HCL 50 MG PO TABS: 50.0000 mg | ORAL_TABLET | Freq: Four times a day (QID) | ORAL | 0 refills | Status: DC | PRN

## 2021-05-01 MED ORDER — OXYCODONE HCL 5 MG PO TABS
5.0000 mg | ORAL_TABLET | Freq: Once | ORAL | Status: AC
  Administered 2021-05-01: 5 mg via ORAL
  Filled 2021-05-01: qty 1

## 2021-05-01 NOTE — ED Provider Notes (Signed)
[facility] Emergency Department Provider Note ____________________________________________  Time seen: Approximately 12:34 PM  I have reviewed the triage vital signs and the nursing notes.   HISTORY  Chief Complaint Fall    HPI Kenli Waldo Buchberger is a 80 y.o. female who presents to the emergency department for evaluation and treatment of back and right hip pain after mechanical nonsyncopal fall few days ago.  She states she was trying to reach something on the top shelf and tripped.  She denies striking her head. She landed on her right hip buttock and caught herself with her elbows. Not on blood thinners. No headaches, change in mentation, or vision changes. Little relief with Tylenol.  Past Medical History:  Diagnosis Date  . Anginal pain ([place]) 2019   currently has pain w/ stress  . Anxiety   . Arthritis   . Cancer ([place])    cancer within appendix per pt  . Collagen vascular disease (HCC)    RA  . Depression   . Genital herpes simplex   . GERD (gastroesophageal reflux disease)   . Hypertension   . Osteoporosis     Patient Active Problem List   Diagnosis Date Noted  . HTN (hypertension) 03/25/2021  . Generalized weakness 03/25/2021  . Pyuria, sterile 03/25/2021  . Cancer ([place])   . 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 ABDOMINAL HYSTERECTOMY    . COLONOSCOPY    . COLONOSCOPY WITH PROPOFOL N/A 05/04/2018   Procedure: COLONOSCOPY WITH PROPOFOL;  Surgeon: Toledo, Benay Pike, MD;  Location: [facility] ENDOSCOPY;  Service: Gastroenterology;  Laterality: N/A;  . ESOPHAGOGASTRODUODENOSCOPY (EGD) WITH PROPOFOL N/A 05/04/2018   Procedure: ESOPHAGOGASTRODUODENOSCOPY (EGD) WITH PROPOFOL;  Surgeon: Toledo, Benay Pike, MD;  Location: [facility] ENDOSCOPY;  Service: Gastroenterology;  Laterality: N/A;  . 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 LAPAROSCOPIC RIGHT HEMI COLECTOMY Right 09/07/2018    Procedure: LAPAROSCOPIC RIGHT HEMI COLECTOMY;  Surgeon: Benjamine Sprague, DO;  Location: [facility] ORS;  Service: General;  Laterality: Right;  . OOPHORECTOMY    . TUBAL LIGATION      Prior to Admission medications   Medication Sig Start Date End Date Taking? Authorizing Provider  traMADol (ULTRAM) 50 MG tablet Take 1 tablet (50 mg total) by mouth every 6 (six) hours as needed. 05/01/21  Yes Kristianna Saperstein B, FNP  acetaminophen (TYLENOL) 325 MG tablet Take 2 tablets (650 mg total) by mouth every 6 (six) hours as needed for mild pain. 09/12/18   Lysle Pearl, Isami, DO  alendronate (FOSAMAX) 70 MG tablet Take 70 mg by mouth once a week. Take with a full glass of water on an empty stomach.    [provider]  escitalopram (LEXAPRO) 20 MG tablet Take 20 mg by mouth daily.    [provider]  hydrALAZINE (APRESOLINE) 25 MG tablet Take 1 tablet (25 mg total) by mouth 3 (three) times daily. 03/26/21   Ezekiel Slocumb, DO  isosorbide mononitrate (IMDUR) 30 MG 24 hr tablet Take 30 mg by mouth daily.    [provider]  lidocaine (LIDODERM) 5 % Place 1 patch onto the skin every 12 (twelve) hours. Remove & Discard patch within 12 hours or as directed by MD 06/14/20 06/14/21  Blake Divine, MD  LORazepam (ATIVAN) 0.5 MG tablet Take 0.5 mg by mouth daily at 8 pm.     [provider]  losartan (COZAAR) 50 MG tablet Take 50  mg by mouth daily.    [provider]  meloxicam (MOBIC) 15 MG tablet Take 15 mg by mouth daily.    [provider]  omeprazole (PRILOSEC) 20 MG capsule Take 20 mg by mouth daily.    [provider]  valACYclovir (VALTREX) 500 MG tablet Take 500 mg by mouth daily.     [provider]  vitamin B-12 (CYANOCOBALAMIN) 1000 MCG tablet Take 1,000 mcg by mouth daily.    [provider]    Allergies Lactose intolerance (gi)  Family History  Problem Relation Age of Onset  . Breast cancer Maternal Aunt   . Pancreatic cancer  Mother   . Colon cancer Father   . Asthma Sister   . Pancreatic cancer Brother   . COPD Sister   . Hypertension Brother     Social History Social History   Tobacco Use  . Smoking status: Never Smoker  . Smokeless tobacco: Never Used  Vaping Use  . Vaping Use: Never used  Substance Use Topics  . Alcohol use: No  . Drug use: Never    Review of Systems Constitutional: Negative for fever. Cardiovascular: Negative for chest pain. Respiratory: Negative for shortness of breath. Musculoskeletal: Positive for right hip and coccyx pain. Skin: No wounds or lesions on exposed skin.  Neurological: Negative for decrease in sensation  ____________________________________________   PHYSICAL EXAM:  VITAL SIGNS: ED Triage Vitals  Enc Vitals Group     BP 05/01/21 1135 (!) 166/97     Pulse Rate 05/01/21 1135 65     Resp 05/01/21 1135 20     Temp 05/01/21 1135 98.7 F (37.1 C)     Temp Source 05/01/21 1135 Oral     SpO2 05/01/21 1135 95 %     Weight 05/01/21 1136 190 lb (86.2 kg)     Height 05/01/21 1136 5\' 5"  (1.651 m)     Head Circumference --      Peak Flow --      Pain Score 05/01/21 1135 2     Pain Loc --      Pain Edu? --      Excl. in [place]? --     Constitutional: Alert and oriented. Well appearing and in no acute distress. Eyes: Conjunctivae are clear without discharge or drainage Head: Atraumatic Neck: Supple Respiratory: No cough. Respirations are even and unlabored. Musculoskeletal: Severe pain with right knee flexion. Focal tenderness over the coccyx. FROM of upper extremities and left lower extremity. Neurologic: Awake, alert, oriented. Motor and sensory intact.  Skin: No open wounds on exposed skin.  Psychiatric: Affect and behavior are appropriate.  ____________________________________________   LABS (all labs ordered are listed, but only abnormal results are displayed)  Labs Reviewed - No data to  display ____________________________________________  RADIOLOGY  X-ray of the right hip negative.  I, Sherrie George, personally viewed and evaluated these images (plain radiographs) as part of my medical decision making, as well as reviewing the written report by the radiologist.  CT PELVIS WO CONTRAST  Result Date: 05/01/2021 CLINICAL DATA:  Right hip pain after fall several days ago. EXAM: CT PELVIS WITHOUT CONTRAST TECHNIQUE: Multidetector CT imaging of the pelvis was performed following the standard protocol without intravenous contrast. COMPARISON:  Radiographs of same day. FINDINGS: Urinary Tract:  No abnormality visualized. Bowel:  Unremarkable visualized pelvic bowel loops. Vascular/Lymphatic: No pathologically enlarged lymph nodes. No significant vascular abnormality seen. Reproductive: Status post hysterectomy. No adnexal abnormality is noted. Other:  None. Musculoskeletal:  No fracture or other bony abnormality is noted. IMPRESSION: No significant abnormality seen in the pelvis. No definite evidence of hip fracture. Electronically Signed   By: Marijo Conception M.D.   On: 05/01/2021 13:33   DG Hip Unilat  With Pelvis 2-3 Views Right  Result Date: 05/01/2021 CLINICAL DATA:  Right hip pain after fall several days ago. EXAM: DG HIP (WITH OR WITHOUT PELVIS) 2-3V RIGHT COMPARISON:  None. FINDINGS: There is no evidence of hip fracture or dislocation. There is no evidence of arthropathy or other focal bone abnormality. IMPRESSION: Negative. Electronically Signed   By: Marijo Conception M.D.   On: 05/01/2021 12:28   ____________________________________________   PROCEDURES  Procedures  ____________________________________________   INITIAL IMPRESSION / ASSESSMENT AND PLAN / ED COURSE  CHARLESETTA MILLIRON is a 80 y.o. who presents to the emergency department for treatment and evaluation of right hip pain and coccyx pain after falling a few days ago. See HPI. X-ray is reassuring, however on  exam she has severe pain with attempt to flex the right knee. Will order CT hip/pelvis. Patient aware and agreeable to the plan.  CT pelvis is negative for acute concerns. Patient advised to use ice/heat off and on through the day and to follow up with primary care if not improving over the week. She was advised to return to the ER for symptoms that change or worsen if unable to schedule an appointment.   Medications  oxyCODONE (Oxy IR/ROXICODONE) immediate release tablet 5 mg (5 mg Oral Given 05/01/21 1317)    Pertinent labs & imaging results that were available during my care of the patient were reviewed by me and considered in my medical decision making (see chart for details).   _________________________________________   FINAL CLINICAL IMPRESSION(S) / ED DIAGNOSES  Final diagnoses:  Acute right hip pain  Pain in the coccyx    ED Discharge Orders         Ordered    traMADol (ULTRAM) 50 MG tablet  Every 6 hours PRN        05/01/21 1342           If controlled substance prescribed during this visit, 12 month history viewed on the [place] prior to issuing an initial prescription for Schedule II or III opiod.   Victorino Dike, FNP 05/01/21 1444    Vladimir Crofts, MD 05/01/21 1501

## 2021-05-01 NOTE — Discharge Instructions (Signed)
Please follow-up with your primary care provider if not improving over the week.  Use ice off-and-on throughout the day as we discussed.  If ice does not seem to help, please use heat.  Return to the emergency department for symptoms of change or worsen if you are unable to schedule an appointment with your primary care provider.

## 2021-05-01 NOTE — ED Triage Notes (Signed)
Pt to ED POV for fall a few days ago, c/o back and right hip pain.  States tripped while trying to reach something on a top shelf, denies hitting head Ambulatory with cane
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
## 2021-10-03 ENCOUNTER — Encounter: Payer: Self-pay | Admitting: Emergency Medicine

## 2021-10-03 ENCOUNTER — Emergency Department: Payer: Medicare Other

## 2021-10-03 ENCOUNTER — Other Ambulatory Visit: Payer: Self-pay

## 2021-10-03 ENCOUNTER — Observation Stay
Admission: EM | Admit: 2021-10-03 | Discharge: 2021-10-04 | Disposition: A | Discharge status: Home / Self Care | Payer: Medicare Other | Attending: Internal Medicine | Admitting: Internal Medicine

## 2021-10-03 DIAGNOSIS — Z20822 Contact with and (suspected) exposure to covid-19: Secondary | ICD-10-CM | POA: Diagnosis not present

## 2021-10-03 DIAGNOSIS — R2243 Localized swelling, mass and lump, lower limb, bilateral: Secondary | ICD-10-CM | POA: Diagnosis not present

## 2021-10-03 DIAGNOSIS — R0902 Hypoxemia: Secondary | ICD-10-CM

## 2021-10-03 DIAGNOSIS — I129 Hypertensive chronic kidney disease with stage 1 through stage 4 chronic kidney disease, or unspecified chronic kidney disease: Secondary | ICD-10-CM | POA: Diagnosis not present

## 2021-10-03 DIAGNOSIS — R079 Chest pain, unspecified: Secondary | ICD-10-CM | POA: Diagnosis present

## 2021-10-03 DIAGNOSIS — N189 Chronic kidney disease, unspecified: Secondary | ICD-10-CM | POA: Insufficient documentation

## 2021-10-03 DIAGNOSIS — I10 Essential (primary) hypertension: Secondary | ICD-10-CM

## 2021-10-03 DIAGNOSIS — E0590 Thyrotoxicosis, unspecified without thyrotoxic crisis or storm: Secondary | ICD-10-CM

## 2021-10-03 DIAGNOSIS — Z85038 Personal history of other malignant neoplasm of large intestine: Secondary | ICD-10-CM | POA: Insufficient documentation

## 2021-10-03 DIAGNOSIS — I4891 Unspecified atrial fibrillation: Principal | ICD-10-CM | POA: Diagnosis present

## 2021-10-03 DIAGNOSIS — R002 Palpitations: Secondary | ICD-10-CM | POA: Diagnosis present

## 2021-10-03 DIAGNOSIS — F419 Anxiety disorder, unspecified: Secondary | ICD-10-CM | POA: Diagnosis present

## 2021-10-03 DIAGNOSIS — F32A Depression, unspecified: Secondary | ICD-10-CM | POA: Diagnosis present

## 2021-10-03 DIAGNOSIS — Z79899 Other long term (current) drug therapy: Secondary | ICD-10-CM | POA: Diagnosis not present

## 2021-10-03 DIAGNOSIS — Z96653 Presence of artificial knee joint, bilateral: Secondary | ICD-10-CM | POA: Insufficient documentation

## 2021-10-03 LAB — COMPREHENSIVE METABOLIC PANEL
ALT: 13 U/L (ref 0–44)
AST: 21 U/L (ref 15–41)
Albumin: 3.4 g/dL — ABNORMAL LOW (ref 3.5–5.0)
Alkaline Phosphatase: 83 U/L (ref 38–126)
Anion gap: 3 — ABNORMAL LOW (ref 5–15)
BUN: 19 mg/dL (ref 8–23)
CO2: 23 mmol/L (ref 22–32)
Calcium: 8.4 mg/dL — ABNORMAL LOW (ref 8.9–10.3)
Chloride: 112 mmol/L — ABNORMAL HIGH (ref 98–111)
Creatinine, Ser: 0.98 mg/dL (ref 0.44–1.00)
GFR, Estimated: 58 mL/min — ABNORMAL LOW (ref >60)
Glucose, Bld: 135 mg/dL — ABNORMAL HIGH (ref 70–99)
Potassium: 3.7 mmol/L (ref 3.5–5.1)
Sodium: 138 mmol/L (ref 135–145)
Total Bilirubin: 0.8 mg/dL (ref 0.3–1.2)
Total Protein: 6.7 g/dL (ref 6.5–8.1)

## 2021-10-03 LAB — CBC WITH DIFFERENTIAL/PLATELET
Abs Immature Granulocytes: 0.04 10*3/uL (ref 0.00–0.07)
Basophils Absolute: 0 10*3/uL (ref 0.0–0.1)
Basophils Relative: 0 %
Eosinophils Absolute: 0 10*3/uL (ref 0.0–0.5)
Eosinophils Relative: 0 %
HCT: 37.1 % (ref 36.0–46.0)
Hemoglobin: 12.9 g/dL (ref 12.0–15.0)
Immature Granulocytes: 0 %
Lymphocytes Relative: 14 %
Lymphs Abs: 1.2 10*3/uL (ref 0.7–4.0)
MCH: 35.1 pg — ABNORMAL HIGH (ref 26.0–34.0)
MCHC: 34.8 g/dL (ref 30.0–36.0)
MCV: 100.8 fL — ABNORMAL HIGH (ref 80.0–100.0)
Monocytes Absolute: 0.5 10*3/uL (ref 0.1–1.0)
Monocytes Relative: 5 %
Neutro Abs: 7.3 10*3/uL (ref 1.7–7.7)
Neutrophils Relative %: 81 %
Platelets: 144 10*3/uL — ABNORMAL LOW (ref 150–400)
RBC: 3.68 MIL/uL — ABNORMAL LOW (ref 3.87–5.11)
RDW: 12.8 % (ref 11.5–15.5)
WBC: 9 10*3/uL (ref 4.0–10.5)
nRBC: 0 % (ref 0.0–0.2)

## 2021-10-03 LAB — RESP PANEL BY RT-PCR (FLU A&B, COVID) ARPGX2
Influenza A by PCR: NEGATIVE
Influenza B by PCR: NEGATIVE
SARS Coronavirus 2 by RT PCR: NEGATIVE

## 2021-10-03 LAB — TSH: TSH: 0.161 u[IU]/mL — ABNORMAL LOW (ref 0.350–4.500)

## 2021-10-03 LAB — T4, FREE: Free T4: 0.85 ng/dL (ref 0.61–1.12)

## 2021-10-03 LAB — TROPONIN I (HIGH SENSITIVITY)
Troponin I (High Sensitivity): 11 ng/L (ref <18)
Troponin I (High Sensitivity): 23 ng/L — ABNORMAL HIGH (ref <18)

## 2021-10-03 LAB — BRAIN NATRIURETIC PEPTIDE: B Natriuretic Peptide: 338.7 pg/mL — ABNORMAL HIGH (ref 0.0–100.0)

## 2021-10-03 LAB — MAGNESIUM: Magnesium: 2 mg/dL (ref 1.7–2.4)

## 2021-10-03 MED ORDER — ONDANSETRON HCL 4 MG/2ML IJ SOLN: 4.0000 mg | Freq: Four times a day (QID) | INTRAMUSCULAR | Status: DC | PRN

## 2021-10-03 MED ORDER — LOSARTAN POTASSIUM 50 MG PO TABS
50.0000 mg | ORAL_TABLET | Freq: Every day | ORAL | Status: DC
  Administered 2021-10-04: 50 mg via ORAL
  Filled 2021-10-03: qty 1

## 2021-10-03 MED ORDER — ACETAMINOPHEN 325 MG PO TABS: 650.0000 mg | ORAL_TABLET | Freq: Four times a day (QID) | ORAL | Status: DC | PRN

## 2021-10-03 MED ORDER — ASPIRIN 81 MG PO CHEW: 81.0000 mg | CHEWABLE_TABLET | Freq: Every day | ORAL | Status: DC

## 2021-10-03 MED ORDER — HYDRALAZINE HCL 20 MG/ML IJ SOLN: 5.0000 mg | INTRAMUSCULAR | Status: DC | PRN

## 2021-10-03 MED ORDER — PANTOPRAZOLE SODIUM 40 MG IV SOLR
40.0000 mg | INTRAVENOUS | Status: DC
  Administered 2021-10-04: 40 mg via INTRAVENOUS
  Filled 2021-10-03: qty 40

## 2021-10-03 MED ORDER — ASPIRIN EC 325 MG PO TBEC: 325.0000 mg | DELAYED_RELEASE_TABLET | Freq: Once | ORAL | Status: DC

## 2021-10-03 MED ORDER — DILTIAZEM HCL-DEXTROSE 125-5 MG/125ML-% IV SOLN (PREMIX): 5.0000 mg/h | INTRAVENOUS | Status: DC

## 2021-10-03 MED ORDER — ASPIRIN EC 81 MG PO TBEC: 81.0000 mg | DELAYED_RELEASE_TABLET | Freq: Every day | ORAL | Status: DC

## 2021-10-03 MED ORDER — VALACYCLOVIR HCL 500 MG PO TABS
500.0000 mg | ORAL_TABLET | Freq: Every day | ORAL | Status: DC
  Administered 2021-10-04: 500 mg via ORAL
  Filled 2021-10-03: qty 1

## 2021-10-03 MED ORDER — DILTIAZEM HCL-DEXTROSE 125-5 MG/125ML-% IV SOLN (PREMIX)
5.0000 mg/h | INTRAVENOUS | Status: DC
  Filled 2021-10-03: qty 125

## 2021-10-03 MED ORDER — DILTIAZEM HCL 25 MG/5ML IV SOLN
15.0000 mg | Freq: Once | INTRAVENOUS | Status: AC
  Administered 2021-10-03: 15 mg via INTRAVENOUS
  Filled 2021-10-03: qty 5

## 2021-10-03 MED ORDER — ACETAMINOPHEN 325 MG RE SUPP: 650.0000 mg | Freq: Four times a day (QID) | RECTAL | Status: DC | PRN

## 2021-10-03 MED ORDER — ONDANSETRON HCL 4 MG PO TABS: 4.0000 mg | ORAL_TABLET | Freq: Four times a day (QID) | ORAL | Status: DC | PRN

## 2021-10-03 NOTE — ED Notes (Signed)
Called lab to check BNP progress- lab states they will run it.

## 2021-10-03 NOTE — ED Provider Notes (Signed)
[facility] Emergency Department Provider Note ____________________________________________   Event Date/Time   First MD Initiated Contact with Patient 10/03/21 1907     (approximate)  I have reviewed the triage vital signs and the nursing notes.  HISTORY  Chief Complaint Palpitations   HPI Sherry Owens is a 80 y.o. femalewho presents to the ED for evaluation of palpitations.   Chart review indicates paroxysmal SVT recently establishing with [facility] cardiology about 6 months ago. History of HTN and CKD.  Chronic fatigue, anxiety and depression. No known history of atrial fibrillation.  Patient presents to the ED for evaluation of acute onset palpitations and chest pain.  She reports getting up to answer the phone when she felt sudden onset palpitations with intermittent pressure to the left side of her neck and upper chest.  Denies shortness of breath, falls or syncope, but reports associated generalized weakness.  Denies fever, cough or recent illnesses.  Past Medical History:  Diagnosis Date   Anginal pain ([place]) 2019   currently has pain w/ stress   Anxiety    Arthritis    Cancer ([place])    cancer within appendix per pt   Collagen vascular disease (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    Past Surgical History:  Procedure Laterality Date   ABDOMINAL HYSTERECTOMY     COLONOSCOPY     COLONOSCOPY WITH PROPOFOL N/A 05/04/2018   Procedure: COLONOSCOPY WITH PROPOFOL;  Surgeon: Toledo, Benay Pike, MD;  Location: [facility] ENDOSCOPY;  Service: Gastroenterology;  Laterality: N/A;   ESOPHAGOGASTRODUODENOSCOPY (EGD) WITH PROPOFOL N/A 05/04/2018   Procedure:  ESOPHAGOGASTRODUODENOSCOPY (EGD) WITH PROPOFOL;  Surgeon: Toledo, Benay Pike, MD;  Location: [facility] ENDOSCOPY;  Service: Gastroenterology;  Laterality: N/A;   JOINT REPLACEMENT Bilateral    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 Authorizing Provider  alendronate (FOSAMAX) 70 MG tablet Take 70 mg by mouth once a week. Take with a full glass of water on an empty stomach.   Yes [provider]  losartan (COZAAR) 25 MG tablet Take 50 mg by mouth daily. 08/20/21  Yes [provider]  vitamin B-12 (CYANOCOBALAMIN) 1000 MCG tablet Take 1,000 mcg by mouth daily.   Yes [provider]  Vitamin D, Ergocalciferol, (DRISDOL) 1.25 MG (50000 UNIT) CAPS capsule Take 50,000 Units by mouth once a week. 09/24/21  Yes [provider]  acetaminophen (TYLENOL) 325 MG tablet Take 2 tablets (650 mg total) by mouth every 6 (six) hours as needed for mild pain. 09/12/18   Sakai, Isami, DO  escitalopram (LEXAPRO) 20 MG tablet Take 20 mg by mouth daily. Patient not taking: Reported on 10/03/2021    [provider]  hydrALAZINE (APRESOLINE) 25 MG tablet Take 1 tablet (25 mg total) by mouth 3 (three) times daily. Patient not taking: No sig reported 03/26/21  Nicole Kindred A, DO  isosorbide mononitrate (IMDUR) 30 MG 24 hr tablet Take 30 mg by mouth daily. Patient not taking: Reported on 10/03/2021    [provider]  LORazepam (ATIVAN) 0.5 MG tablet Take 0.5 mg by mouth daily at 8 pm.     [provider]  meloxicam (MOBIC) 15 MG tablet Take 15 mg by mouth daily.    [provider]  omeprazole (PRILOSEC) 20 MG capsule Take 20 mg by mouth daily.    [provider]  traMADol (ULTRAM) 50 MG tablet Take 1 tablet (50 mg total) by mouth every 6 (six) hours as needed. Patient not taking: No sig reported 05/01/21   Sherrie George B, FNP  valACYclovir (VALTREX) 500 MG tablet Take 500 mg by mouth daily.     [provider]    Allergies Lactose intolerance (gi)  Family History  Problem Relation Age of Onset   Breast cancer Maternal Aunt    Pancreatic cancer Mother    Colon cancer Father    Asthma Sister    Pancreatic cancer Brother    COPD Sister    Hypertension Brother     Social History Social History   Tobacco Use   Smoking status: Never   Smokeless tobacco: Never  Vaping Use   Vaping Use: Never used  Substance Use Topics   Alcohol use: No   Drug use: Never    Review of Systems  Constitutional: No fever/chills Eyes: No visual changes. ENT: No sore throat. Cardiovascular: Positive for palpitations and chest pain. Respiratory: Denies shortness of breath. Gastrointestinal: No abdominal pain.  No nausea, no vomiting.  No diarrhea.  No constipation. Genitourinary: Negative for dysuria. Musculoskeletal: Negative for back pain. Skin: Negative for rash. Neurological: Negative for headaches, focal weakness or numbness.  ____________________________________________   PHYSICAL EXAM:  VITAL SIGNS: Vitals:   10/03/21 2000 10/03/21 2004  BP: (!) 169/144   Pulse: 63 63  Resp: 14 16  Temp:    SpO2: 99% 98%    Constitutional: Alert and oriented. Well appearing and in no acute distress. Eyes: Conjunctivae are normal. PERRL. EOMI. Head: Atraumatic. Nose: No congestion/rhinnorhea. Mouth/Throat: Mucous membranes are moist.  Oropharynx non-erythematous. Neck: No stridor. No cervical spine tenderness to palpation. Cardiovascular: Tachycardic and irregular rhythm. Grossly normal heart sounds.  Good peripheral circulation. Respiratory: Minimal tachypnea to the low 20s.  No further evidence of distress.  Bibasilar  crackles are present.  No wheezing. Gastrointestinal: Soft , nondistended, nontender to palpation. No CVA tenderness. Musculoskeletal: No lower extremity tenderness.  No joint effusions. No signs of acute trauma. Trace edema to bilateral lower extremities symmetrically Neurologic:  Normal speech and language. No gross focal neurologic deficits are appreciated.  Skin:  Skin is warm, dry and intact. No rash noted. Psychiatric: Mood and affect are normal. Speech and behavior are normal. ____________________________________________   LABS (all labs ordered are listed, but only abnormal results are displayed)  Labs Reviewed  COMPREHENSIVE METABOLIC PANEL - Abnormal; Notable for the following components:      Result Value   Chloride 112 (*)    Glucose, Bld 135 (*)    Calcium 8.4 (*)    Albumin 3.4 (*)    GFR, Estimated 58 (*)    Anion gap 3 (*)    All other components within normal limits  CBC WITH DIFFERENTIAL/PLATELET - Abnormal; Notable for the following components:   RBC 3.68 (*)    MCV 100.8 (*)    MCH 35.1 (*)  Platelets 144 (*)    All other components within normal limits  TSH - Abnormal; Notable for the following components:   TSH 0.161 (*)    All other components within normal limits  RESP PANEL BY RT-PCR (FLU A&B, COVID) ARPGX2  MAGNESIUM  BRAIN NATRIURETIC PEPTIDE  T4, FREE  TROPONIN I (HIGH SENSITIVITY)   ____________________________________________  12 Lead EKG  Afib w RVR, rate of 132 BPM, normal axis and intervals. No STEMI ____________________________________________  RADIOLOGY  ED MD interpretation: 1 view CXR with pulmonary vascular congestion and left basilar consolidation with elevation of left hemidiaphragm, likely atelectasis  Official radiology report(s): DG Chest Portable 1 View  Result Date: 10/03/2021 CLINICAL DATA:  Atrial fibrillation EXAM: PORTABLE CHEST 1 VIEW COMPARISON:  03/25/2021 FINDINGS: Single frontal view of the chest demonstrates  a stable cardiac silhouette. There is increased central vascular congestion. Patchy consolidation at the left lung base may reflect airspace disease or atelectasis, given underlying chronic elevation of the left hemidiaphragm. No effusion or pneumothorax. IMPRESSION: 1. Central vascular congestion. 2. Left basilar consolidation, favor atelectasis. Electronically Signed   By: Randa Ngo M.D.   On: 10/03/2021 19:40    ____________________________________________   PROCEDURES and INTERVENTIONS  Procedure(s) performed (including Critical Care):  .1-3 Lead EKG Interpretation Performed by: Vladimir Crofts, MD Authorized by: Vladimir Crofts, MD     Interpretation: abnormal     ECG rate:  144   ECG rate assessment: tachycardic     Rhythm: atrial fibrillation     Ectopy: none     Conduction: normal   .Critical Care Performed by: Vladimir Crofts, MD Authorized by: Vladimir Crofts, MD   Critical care provider statement:    Critical care time (minutes):  30   Critical care was necessary to treat or prevent imminent or life-threatening deterioration of the following conditions:  Circulatory failure and cardiac failure   Critical care was time spent personally by me on the following activities:  Ordering and performing treatments and interventions, ordering and review of laboratory studies, ordering and review of radiographic studies, pulse oximetry, re-evaluation of patient's condition, review of old charts, examination of patient and development of treatment plan with patient or surrogate  Medications  diltiazem (CARDIZEM) injection 15 mg (15 mg Intravenous Given 10/03/21 2003)    ____________________________________________   MDM / ED COURSE   80 year old woman presents to the ED with new onset atrial fibrillation, possibly due to thyroid dysfunction, and requiring medical admission.  Presents tachycardic with A. fib with RVR rates in the 140s, but hemodynamically stable.  Mild hypoxia is noted and  requiring 2 L nasal cannula.  No evidence of ACS or ischemia.  Electrolytes are within normal limits, but TSH is noted to be low.  Possibly hyperthyroidism contributing to this.  CXR questions a left basilar infiltrate, likely atelectasis considering hemidiaphragm elevation alongside it.  She has no clinical evidence of sepsis or pneumonia.  Volume overload and pulmonary congestion is noted on CXR, this is likely etiology of her mild hypoxia.  We will discuss with medicine for admission.  Clinical Course as of 10/03/21 2042  Fri Oct 03, 2021  2011 After dilt bolus, she seems to have converted back to sinus rhythm with rates in the upper 60s. [DS]    Clinical Course User Index [DS] Vladimir Crofts, MD    ____________________________________________   FINAL CLINICAL IMPRESSION(S) / ED DIAGNOSES  Final diagnoses:  New onset atrial fibrillation ([place])  Atrial fibrillation with RVR ([place])  Hypoxia  Hyperthyroidism  ED Discharge Orders     None        Madison Direnzo [place]   Note:  This document was prepared using Dragon voice recognition software and may include unintentional dictation errors.    Vladimir Crofts, MD 10/03/21 2049

## 2021-10-03 NOTE — H&P (Addendum)
History and Physical    Sherry Owens HFG:[id number] DOB: Nov 16, 1941 DOA: 10/03/2021  PCP: Marinda Elk, MD    Patient coming from:  Home    Chief Complaint:  Palpitations.   HPI: Sherry Owens is a 80 y.o. female seen in ed with complaints of palpitation and pt given labetalol 5 mg en route to ed. Pt is seeing [facility] cardi[facility] for svt's and last was seen by Pt states that today she got tele marketer call and after speaking to them on her way back her heart started racing and sore throat, and has chest discomfort. Few months ago started seeing heart doctor for her palpitation and was to get Holter monitor. Pt does have anxiety as well. Chest pain/ pressure: 10/10 Pressure  Lasted 30-40 minutes/ Then eased off 6/10 after she was given medicine by ems.  Has happened about two weeks ago and no chest pain and was not severe then. No N/V. Pt was very weak and held the door to prevent from falling and sat herself on th floor.  Pt states she has ear pain on left side , and her forehead and ears.  ROS otherwise negative. Pt does not take any aspirin and no allergies to aspirin.pt got diltiazem and her heart rate is well controlled, she has converted to sinus rhythm.   Pt has past medical history of hypertension, A. fib RVR, depression, generalized weakness. ED Course:  Vitals:   10/03/21 2125 10/03/21 2130 10/03/21 2200 10/03/21 2230  BP:  (!) 160/84 (!) 160/99 (!) 159/102  Pulse: 60 62 (!) 57 (!) 59  Resp: 17 14 (!) 22 17  Temp:      TempSrc:      SpO2: 100% 97% 96% 98%  Weight:      Height:      In ed pt is Alert/ Awake afebrile and oriented.  Answers questions appropriately and describes her chest pain she felt. CMP shows a glucose of 135 and chloride 112, 9, 3 magnesium of 51, troponin of 11 and repeat troponin of 23, CBC shows white count of 9, hemoglobin of 12.9 and platelets of 144 Respiratory panel is negative for flu and COVID.  Review of Systems:   Review of Systems  Constitutional: Negative.   HENT:  Positive for ear pain.   Eyes: Negative.   Respiratory: Negative.  Negative for cough and shortness of breath.   Cardiovascular:  Positive for palpitations and leg swelling.  Gastrointestinal: Negative.   Genitourinary: Negative.   Musculoskeletal: Negative.   Skin: Negative.   Neurological:  Positive for headaches.  Endo/Heme/Allergies: Negative.   Psychiatric/Behavioral: Negative.      Past Medical History:  Diagnosis Date   Anginal pain ([place]) 2019   currently has pain w/ stress   Anxiety    Arthritis    Cancer ([place])    cancer within appendix per pt   Collagen vascular disease ([place])    RA   Depression    Genital herpes simplex    GERD (gastroesophageal reflux disease)    Hypertension    Osteoporosis     Past Surgical History:  Procedure Laterality Date   ABDOMINAL HYSTERECTOMY     COLONOSCOPY     COLONOSCOPY WITH PROPOFOL N/A 05/04/2018   Procedure: COLONOSCOPY WITH PROPOFOL;  Surgeon: Toledo, Benay Pike, MD;  Location: [facility] ENDOSCOPY;  Service: Gastroenterology;  Laterality: N/A;   ESOPHAGOGASTRODUODENOSCOPY (EGD) WITH PROPOFOL N/A 05/04/2018   Procedure: ESOPHAGOGASTRODUODENOSCOPY (EGD) WITH PROPOFOL;  Surgeon: Alice Reichert, Benay Pike, MD;  Location: [facility] ENDOSCOPY;  Service: Gastroenterology;  Laterality: N/A;   JOINT REPLACEMENT Bilateral    bilateral total knees: R-2016, L-2017   LAPAROSCOPIC RIGHT HEMI COLECTOMY Right 09/07/2018   Procedure: LAPAROSCOPIC RIGHT HEMI COLECTOMY;  Surgeon: Benjamine Sprague, DO;  Location: [facility] ORS;  Service: General;  Laterality: Right;   OOPHORECTOMY     TUBAL LIGATION       reports that she has never smoked. She has never used smokeless tobacco. She reports that she does not drink alcohol and does not use drugs.  Allergies  Allergen Reactions   Lactose Intolerance (Gi) Diarrhea    Stomach upset and diarrhea    Family History  Problem Relation Age of Onset   Breast cancer  Maternal Aunt    Pancreatic cancer Mother    Colon cancer Father    Asthma Sister    Pancreatic cancer Brother    COPD Sister    Hypertension Brother     Prior to Admission medications   Medication Sig Start Date End Date Taking? Authorizing Provider  alendronate (FOSAMAX) 70 MG tablet Take 70 mg by mouth once a week. Take with a full glass of water on an empty stomach.   Yes [provider]  losartan (COZAAR) 25 MG tablet Take 50 mg by mouth daily. 08/20/21  Yes [provider]  vitamin B-12 (CYANOCOBALAMIN) 1000 MCG tablet Take 1,000 mcg by mouth daily.   Yes [provider]  Vitamin D, Ergocalciferol, (DRISDOL) 1.25 MG (50000 UNIT) CAPS capsule Take 50,000 Units by mouth once a week. 09/24/21  Yes [provider]  acetaminophen (TYLENOL) 325 MG tablet Take 2 tablets (650 mg total) by mouth every 6 (six) hours as needed for mild pain. 09/12/18   Sakai, Isami, DO  escitalopram (LEXAPRO) 20 MG tablet Take 20 mg by mouth daily. Patient not taking: Reported on 10/03/2021    [provider]  hydrALAZINE (APRESOLINE) 25 MG tablet Take 1 tablet (25 mg total) by mouth 3 (three) times daily. Patient not taking: No sig reported 03/26/21   Nicole Kindred A, DO  isosorbide mononitrate (IMDUR) 30 MG 24 hr tablet Take 30 mg by mouth daily. Patient not taking: Reported on 10/03/2021    [provider]  LORazepam (ATIVAN) 0.5 MG tablet Take 0.5 mg by mouth daily at 8 pm.     [provider]  meloxicam (MOBIC) 15 MG tablet Take 15 mg by mouth daily.    [provider]  omeprazole (PRILOSEC) 20 MG capsule Take 20 mg by mouth daily.    [provider]  traMADol (ULTRAM) 50 MG tablet Take 1 tablet (50 mg total) by mouth every 6 (six) hours as needed. Patient not taking: No sig reported 05/01/21   Sherrie George B, FNP  valACYclovir (VALTREX) 500 MG tablet Take 500 mg by mouth daily.     [provider]    Physical  Exam: Vitals:   10/03/21 2125 10/03/21 2130 10/03/21 2200 10/03/21 2230  BP:  (!) 160/84 (!) 160/99 (!) 159/102  Pulse: 60 62 (!) 57 (!) 59  Resp: 17 14 (!) 22 17  Temp:      TempSrc:      SpO2: 100% 97% 96% 98%  Weight:      Height:       Physical Exam Constitutional:      General: She is not in acute distress.    Appearance: She is obese. She is not ill-appearing, toxic-appearing or diaphoretic.  HENT:  Head: Normocephalic and atraumatic.     Right Ear: External ear normal.     Left Ear: External ear normal.     Nose: Nose normal.     Mouth/Throat:     Mouth: Mucous membranes are moist.  Eyes:     Extraocular Movements: Extraocular movements intact.     Pupils: Pupils are equal, round, and reactive to light.  Neck:     Vascular: No carotid bruit.  Cardiovascular:     Rate and Rhythm: Normal rate and regular rhythm.     Pulses: Normal pulses.     Heart sounds: Normal heart sounds.  Pulmonary:     Effort: Pulmonary effort is normal.     Breath sounds: Normal breath sounds.  Abdominal:     General: Bowel sounds are normal. There is no distension.     Palpations: Abdomen is soft. There is no mass.     Tenderness: There is no abdominal tenderness. There is no guarding.     Hernia: No hernia is present.  Musculoskeletal:     Right lower leg: No edema.     Left lower leg: No edema.  Skin:    General: Skin is warm.  Neurological:     General: No focal deficit present.     Mental Status: She is alert and oriented to person, place, and time.  Psychiatric:        Mood and Affect: Mood normal.        Behavior: Behavior normal.   Labs on Admission: I have personally reviewed following labs and imaging studies  No results for input(s): CKTOTAL, CKMB, TROPONINI in the last 72 hours. Lab Results  Component Value Date   WBC 9.0 10/03/2021   HGB 12.9 10/03/2021   HCT 37.1 10/03/2021   MCV 100.8 (H) 10/03/2021   PLT 144 (L) 10/03/2021    Recent Labs  Lab  10/03/21 1934  NA 138  K 3.7  CL 112*  CO2 23  BUN 19  CREATININE 0.98  CALCIUM 8.4*  PROT 6.7  BILITOT 0.8  ALKPHOS 83  ALT 13  AST 21  GLUCOSE 135*   No results found for: CHOL, HDL, LDLCALC, TRIG No results found for: DDIMER Invalid input(s): POCBNP   COVID-19 Labs No results for input(s): DDIMER, FERRITIN, LDH, CRP in the last 72 hours. Lab Results  Component Value Date   [place]ie NEGATIVE 10/03/2021   [place] NEGATIVE 03/25/2021    Radiological Exams on Admission: DG Chest Portable 1 View  Result Date: 10/03/2021 CLINICAL DATA:  Atrial fibrillation EXAM: PORTABLE CHEST 1 VIEW COMPARISON:  03/25/2021 FINDINGS: Single frontal view of the chest demonstrates a stable cardiac silhouette. There is increased central vascular congestion. Patchy consolidation at the left lung base may reflect airspace disease or atelectasis, given underlying chronic elevation of the left hemidiaphragm. No effusion or pneumothorax. IMPRESSION: 1. Central vascular congestion. 2. Left basilar consolidation, favor atelectasis. Electronically Signed   By: Randa Ngo M.D.   On: 10/03/2021 19:40    EKG: Independently reviewed.  A.fib rvr on tele. EKG shows  SR at 59, LVH, qtc of 385, Q waves in V1.  Marland Kitchen   Echocardiogram: Pending.    Assessment/Plan Principal Problem:   Atrial fibrillation with rapid ventricular response (HCC) Active Problems:   Chest pain   Palpitation   HTN (hypertension)   Depression   Anxiety  Atrial fibrillation with rapid ventricular response/ palpitations: -Patient presenting with new-onset afib.  -Etiology is thought to be related to  HTN, thyroid dysfunction, ischemic heart disease, CHF. -Since the afib onset was within 48 hours, cardioversion was/will be performed. Cardioversion should also be considered in patient with low LVEF and new-onset AF. -Will plan to place in observation status in progressive/ SDU for Diltiazem drip as per protocol with plan  to transition to PO Diltiazem once heart rate is controlled (resting HR 110 or lower).  -HS troponin negative; will not repeat. -Will give ASA 324  mg PO x 1 (if not already given) and daily ASA 81 mg PO daily.   -Treat chest pain with NTG prn.   -Will request Echocardiogram for further evaluation  -Will risk stratify with FLP and HgbA1c; will also order TSH/free T4 and UDS.  -Repeat EKG in AM if any chest pain or symptoms. -Cardiology consult per AM team.  -Heart rate is well controlled. -CHA2DS2-VASc Score is 4  and so patient would benefit from oral anticoagulation.  -Continue aspirin , eliquis if pt agrees and understands risks of bleeding.   Chest pain: -etiology include cad related or due to increased demand from a.fib rvr. Or GI related.  -Troponin is 11 and 23.Ekg shows q waves no st elevation or TWI. -Prn ntg and morphine, supplemental oxygen. -cardiology consult / stress test per AM MD.   HTN: Blood pressure (!) 159/102, pulse (!) 59, temperature 97.9 F (36.6 C), temperature source Oral, resp. rate 17, height 5\' 5"  (1.651 m), weight 86.2 kg, SpO2 98 %. -We will continue diltiazem and losartan and prn hydralazine.   Anxiety/ Depression: -continue lexapro.    DVT prophylaxis:  Heparin.   Code Status:  Full code    Family Communication:  Lindie Spruce (Other)  [phone] [place] Phone)   Disposition Plan:  Home    Consults called:  None   Admission status: Inpatient.     Para Skeans MD [facility] [phone] How to contact the [facility] Attending or Consulting provider [place] or covering provider during after hours [place], for this patient.    Check the care team in [facility] and look for a) attending/consulting [facility] provider listed and b) the [facility] team listed Log into [email or web address] and use [place]'s universal password to access. If you do not have the password, please contact the hospital operator. Locate the [facility] provider you are looking for  under [facility] and page to a number that you can be directly reached. If you still have difficulty reaching the provider, please page the [facility] (Director on Call) for the Hospitalists listed on amion for assistance. [email or web address] Password [email or web address] 10/04/2021, 12:15 AM

## 2021-10-03 NOTE — ED Triage Notes (Signed)
C/O palpitations.  Per [facility] report, HR afib.  18 g saline lock to rac.  Total of 5 mg labetolol given by EMS PTA.

## 2021-10-04 ENCOUNTER — Inpatient Hospital Stay
Admit: 2021-10-04 | Discharge: 2021-10-04 | Disposition: A | Discharge status: Home / Self Care | Payer: Medicare Other | Attending: Internal Medicine | Admitting: Internal Medicine

## 2021-10-04 DIAGNOSIS — I4891 Unspecified atrial fibrillation: Secondary | ICD-10-CM | POA: Diagnosis present

## 2021-10-04 DIAGNOSIS — K219 Gastro-esophageal reflux disease without esophagitis: Secondary | ICD-10-CM | POA: Diagnosis not present

## 2021-10-04 DIAGNOSIS — I10 Essential (primary) hypertension: Secondary | ICD-10-CM | POA: Diagnosis not present

## 2021-10-04 DIAGNOSIS — R079 Chest pain, unspecified: Secondary | ICD-10-CM | POA: Diagnosis not present

## 2021-10-04 DIAGNOSIS — D696 Thrombocytopenia, unspecified: Secondary | ICD-10-CM | POA: Insufficient documentation

## 2021-10-04 LAB — COMPREHENSIVE METABOLIC PANEL
ALT: 13 U/L (ref 0–44)
AST: 18 U/L (ref 15–41)
Albumin: 3.1 g/dL — ABNORMAL LOW (ref 3.5–5.0)
Alkaline Phosphatase: 84 U/L (ref 38–126)
Anion gap: 5 (ref 5–15)
BUN: 21 mg/dL (ref 8–23)
CO2: 25 mmol/L (ref 22–32)
Calcium: 8.9 mg/dL (ref 8.9–10.3)
Chloride: 109 mmol/L (ref 98–111)
Creatinine, Ser: 1.17 mg/dL — ABNORMAL HIGH (ref 0.44–1.00)
GFR, Estimated: 47 mL/min — ABNORMAL LOW (ref >60)
Glucose, Bld: 127 mg/dL — ABNORMAL HIGH (ref 70–99)
Potassium: 3.6 mmol/L (ref 3.5–5.1)
Sodium: 139 mmol/L (ref 135–145)
Total Bilirubin: 1 mg/dL (ref 0.3–1.2)
Total Protein: 6.7 g/dL (ref 6.5–8.1)

## 2021-10-04 LAB — CBC
HCT: 36.1 % (ref 36.0–46.0)
Hemoglobin: 12.2 g/dL (ref 12.0–15.0)
MCH: 34.6 pg — ABNORMAL HIGH (ref 26.0–34.0)
MCHC: 33.8 g/dL (ref 30.0–36.0)
MCV: 102.3 fL — ABNORMAL HIGH (ref 80.0–100.0)
Platelets: 140 10*3/uL — ABNORMAL LOW (ref 150–400)
RBC: 3.53 MIL/uL — ABNORMAL LOW (ref 3.87–5.11)
RDW: 13.1 % (ref 11.5–15.5)
WBC: 8 10*3/uL (ref 4.0–10.5)
nRBC: 0 % (ref 0.0–0.2)

## 2021-10-04 MED ORDER — APIXABAN 5 MG PO TABS
5.0000 mg | ORAL_TABLET | Freq: Two times a day (BID) | ORAL | Status: DC
  Administered 2021-10-04: 5 mg via ORAL
  Filled 2021-10-04: qty 1

## 2021-10-04 MED ORDER — DILTIAZEM HCL ER COATED BEADS 180 MG PO CP24
180.0000 mg | ORAL_CAPSULE | Freq: Every day | ORAL | Status: DC
  Administered 2021-10-04: 180 mg via ORAL
  Filled 2021-10-04: qty 1

## 2021-10-04 MED ORDER — APIXABAN 5 MG PO TABS: 5.0000 mg | ORAL_TABLET | Freq: Two times a day (BID) | ORAL | 0 refills | Status: AC

## 2021-10-04 MED ORDER — DILTIAZEM HCL ER COATED BEADS 180 MG PO CP24: 180.0000 mg | ORAL_CAPSULE | Freq: Every day | ORAL | 0 refills | Status: DC

## 2021-10-04 MED ORDER — DILTIAZEM HCL ER COATED BEADS 180 MG PO CP24: 180.0000 mg | ORAL_CAPSULE | Freq: Every day | ORAL | 0 refills | Status: AC

## 2021-10-04 MED ORDER — APIXABAN 5 MG PO TABS: 5.0000 mg | ORAL_TABLET | Freq: Two times a day (BID) | ORAL | 0 refills | Status: DC

## 2021-10-04 NOTE — Discharge Summary (Signed)
[name] NAME: Sherry Owens    MR#:  [id number]  DATE OF BIRTH:  [birth year]  DATE OF ADMISSION:  10/03/2021 ADMITTING PHYSICIAN: Loletha Grayer, MD  DATE OF DISCHARGE: 10/04/2021 10:25 AM  PRIMARY CARE PHYSICIAN: Marinda Elk, MD    ADMISSION DIAGNOSIS:  Atrial fibrillation with rapid ventricular response ([place]) [I48.91] A-fib ([place]) [I48.91]  DISCHARGE DIAGNOSIS:  Principal Problem:   Atrial fibrillation with rapid ventricular response (HCC) Active Problems:   HTN (hypertension)   Palpitation   Depression   Anxiety   Chest pain   A-fib ([place])   SECONDARY DIAGNOSIS:   Past Medical History:  Diagnosis Date   Anginal pain ([place]) 2019   currently has pain w/ stress   Anxiety    Arthritis    Cancer ([place])    cancer within appendix per pt   Collagen vascular disease ([place])    RA   Depression    Genital herpes simplex    GERD (gastroesophageal reflux disease)    Hypertension    Osteoporosis     HOSPITAL COURSE:   1.  Atrial fibrillation with rapid ventricular response.  Patient was seen by cardiology and cleared to go home.  Patient was in normal sinus rhythm when I saw her.  Echocardiogram done but reading still pending at the time of discharge.  Patient started on Cardizem CD 180 mg twice a day.  Patient started on Eliquis 5 mg twice daily.  Risks of blood thinner explained to patient. 2.  Chest pain likely secondary to rapid heart rate 3.  Essential hypertension on losartan and Cardizem CD 4.  GERD on omeprazole 5.  Thrombocytopenia follow-up as outpatient.  Looking back at old labs this seems chronic for this patient 6.  Low TSH and low normal T4.  Recommend checking in 4 to 6 weeks as outpatient.  Can consider cosyntropin test at the same time.  DISCHARGE CONDITIONS:   Satisfactory  CONSULTS OBTAINED:  Cardiology  DRUG ALLERGIES:   Allergies  Allergen Reactions   Lactose Intolerance (Gi)  Diarrhea    Stomach upset and diarrhea    DISCHARGE MEDICATIONS:   Allergies as of 10/04/2021       Reactions   Lactose Intolerance (gi) Diarrhea   Stomach upset and diarrhea        Medication List     STOP taking these medications    escitalopram 20 MG tablet Commonly known as: LEXAPRO   meloxicam 15 MG tablet Commonly known as: MOBIC       TAKE these medications    acetaminophen 325 MG tablet Commonly known as: TYLENOL Take 2 tablets (650 mg total) by mouth every 6 (six) hours as needed for mild pain.   alendronate 70 MG tablet Commonly known as: FOSAMAX Take 70 mg by mouth once a week. Take with a full glass of water on an empty stomach.   apixaban 5 MG Tabs tablet Commonly known as: ELIQUIS Take 1 tablet (5 mg total) by mouth 2 (two) times daily.   diltiazem 180 MG 24 hr capsule Commonly known as: CARDIZEM CD Take 1 capsule (180 mg total) by mouth daily.   LORazepam 0.5 MG tablet Commonly known as: ATIVAN Take 0.5 mg by mouth daily at 8 pm.   losartan 25 MG tablet Commonly known as: COZAAR Take 50 mg by mouth daily.   omeprazole 20 MG capsule Commonly known as: PRILOSEC Take 20 mg by mouth daily.   valACYclovir  500 MG tablet Commonly known as: VALTREX Take 500 mg by mouth daily.   vitamin B-12 1000 MCG tablet Commonly known as: CYANOCOBALAMIN Take 1,000 mcg by mouth daily.   Vitamin D (Ergocalciferol) 1.25 MG (50000 UNIT) Caps capsule Commonly known as: DRISDOL Take 50,000 Units by mouth once a week.         DISCHARGE INSTRUCTIONS:   Follow-up PMD 5 days Follow-up cardiology 1 to 2 weeks  If you experience worsening of your admission symptoms, develop shortness of breath, life threatening emergency, suicidal or homicidal thoughts you must seek medical attention immediately by calling 911 or calling your MD immediately  if symptoms less severe.  You Must read complete instructions/literature along with all the possible adverse  reactions/side effects for all the Medicines you take and that have been prescribed to you. Take any new Medicines after you have completely understood and accept all the possible adverse reactions/side effects.   Please note  You were cared for by a hospitalist during your hospital stay. If you have any questions about your discharge medications or the care you received while you were in the hospital after you are discharged, you can call the unit and asked to speak with the hospitalist on call if the hospitalist that took care of you is not available. Once you are discharged, your primary care physician will handle any further medical issues. Please note that NO REFILLS for any discharge medications will be authorized once you are discharged, as it is imperative that you return to your primary care physician (or establish a relationship with a primary care physician if you do not have one) for your aftercare needs so that they can reassess your need for medications and monitor your lab values.    Today   CHIEF COMPLAINT:   Chief Complaint  Patient presents with   Palpitations    HISTORY OF PRESENT ILLNESS:  Sherry Owens  is a 80 y.o. female presented with palpitation   VITAL SIGNS:  Blood pressure (!) 145/92, pulse (!) 49, temperature 98 F (36.7 C), temperature source Oral, resp. rate 17, height 5\' 5"  (1.651 m), weight 86.2 kg, SpO2 96 %.  I/O:  No intake or output data in the 24 hours ending 10/04/21 1638  PHYSICAL EXAMINATION:  GENERAL:  80 y.o.-year-old patient lying in the bed with no acute distress.  EYES: Pupils equal, round, reactive to light and accommodation. No scleral icterus. HEENT: Head atraumatic, normocephalic. Oropharynx and nasopharynx clear.  LUNGS: Normal breath sounds bilaterally, no wheezing, rales,rhonchi or crepitation. No use of accessory muscles of respiration.  CARDIOVASCULAR: S1, S2 normal. No murmurs, rubs, or gallops.  ABDOMEN: Soft, non-tender,  non-distended.  EXTREMITIES: No pedal edema.  NEUROLOGIC: Cranial nerves II through XII are intact. Muscle strength 5/5 in all extremities. Sensation intact. Gait not checked.  PSYCHIATRIC: The patient is alert and oriented x 3.  SKIN: No obvious rash, lesion, or ulcer.   DATA REVIEW:   CBC Recent Labs  Lab 10/04/21 0641  WBC 8.0  HGB 12.2  HCT 36.1  PLT 140*    Chemistries  Recent Labs  Lab 10/03/21 1934 10/04/21 0641  NA 138 139  K 3.7 3.6  CL 112* 109  CO2 23 25  GLUCOSE 135* 127*  BUN 19 21  CREATININE 0.98 1.17*  CALCIUM 8.4* 8.9  MG 2.0  --   AST 21 18  ALT 13 13  ALKPHOS 83 84  BILITOT 0.8 1.0     Microbiology Results  Results for orders placed or performed during the hospital encounter of 10/03/21  Resp Panel by RT-PCR (Flu A&B, Covid) Nasopharyngeal Swab     Status: None   Collection Time: 10/03/21  7:34 PM   Specimen: Nasopharyngeal Swab; Nasopharyngeal(NP) swabs in vial transport medium  Result Value Ref Range Status   SARS Coronavirus 2 by RT PCR NEGATIVE NEGATIVE Final    Comment: (NOTE) SARS-CoV-2 target nucleic acids are NOT DETECTED.  The SARS-CoV-2 RNA is generally detectable in upper respiratory specimens during the acute phase of infection. The lowest concentration of SARS-CoV-2 viral copies this assay can detect is 138 copies/mL. A negative result does not preclude SARS-Cov-2 infection and should not be used as the sole basis for treatment or other patient management decisions. A negative result may occur with  improper specimen collection/handling, submission of specimen other than nasopharyngeal swab, presence of viral mutation(s) within the areas targeted by this assay, and inadequate number of viral copies(<138 copies/mL). A negative result must be combined with clinical observations, patient history, and epidemiological information. The expected result is Negative.  Fact Sheet for Patients:   EntrepreneurPulse.com.au  Fact Sheet for Healthcare Providers:  IncredibleEmployment.be  This test is no t yet approved or cleared by the [place] FDA and  has been authorized for detection and/or diagnosis of SARS-CoV-2 by FDA under an Emergency Use Authorization (EUA). This EUA will remain  in effect (meaning this test can be used) for the duration of the COVID-19 declaration under Section 564(b)(1) of the Act, 21 U.S.C.section 360bbb-3(b)(1), unless the authorization is terminated  or revoked sooner.       Influenza A by PCR NEGATIVE NEGATIVE Final   Influenza B by PCR NEGATIVE NEGATIVE Final    Comment: (NOTE) The Xpert Xpress SARS-CoV-2/FLU/RSV plus assay is intended as an aid in the diagnosis of influenza from Nasopharyngeal swab specimens and should not be used as a sole basis for treatment. Nasal washings and aspirates are unacceptable for Xpert Xpress SARS-CoV-2/FLU/RSV testing.  Fact Sheet for Patients: EntrepreneurPulse.com.au  Fact Sheet for Healthcare Providers: IncredibleEmployment.be  This test is not yet approved or cleared by the [place] FDA and has been authorized for detection and/or diagnosis of SARS-CoV-2 by FDA under an Emergency Use Authorization (EUA). This EUA will remain in effect (meaning this test can be used) for the duration of the COVID-19 declaration under Section 564(b)(1) of the Act, 21 U.S.C. section 360bbb-3(b)(1), unless the authorization is terminated or revoked.  Performed at [facility], [address] [place], [place] [address]     RADIOLOGY:  DG Chest Portable 1 View  Result Date: 10/03/2021 CLINICAL DATA:  Atrial fibrillation EXAM: PORTABLE CHEST 1 VIEW COMPARISON:  03/25/2021 FINDINGS: Single frontal view of the chest demonstrates a stable cardiac silhouette. There is increased central vascular congestion. Patchy consolidation at the  left lung base may reflect airspace disease or atelectasis, given underlying chronic elevation of the left hemidiaphragm. No effusion or pneumothorax. IMPRESSION: 1. Central vascular congestion. 2. Left basilar consolidation, favor atelectasis. Electronically Signed   By: Randa Ngo M.D.   On: 10/03/2021 19:40      Management plans discussed with the patient, and she is in agreement.  Tried to call the first 3 contacts in the computer to give an update but unable to reach anybody.  CODE STATUS:  Code Status History     Date Active Date Inactive Code Status Order ID Comments User Context   10/03/2021 2158 10/04/2021 1532 Full Code [id number]  Para Skeans, MD ED   03/25/2021 2053 03/26/2021 2256 Full Code [id number]  Neena Rhymes, MD ED   09/07/2018 1840 09/12/2018 1626 Full Code [id number]  Benjamine Sprague, DO Inpatient      Questions for Most Recent Historical Code Status (Order [id number])        TOTAL TIME TAKING CARE OF THIS PATIENT: 35 minutes.    Loletha Grayer M.D on 10/04/2021 at 4:38 PM  Between 7am to  [facility]  CC: Primary care physician; Marinda Elk, MD

## 2021-10-04 NOTE — Care Management CC44 (Signed)
Condition Code 44 Documentation Completed  Patient Details  Name: JAZARIAH TEALL MRN: [id number] Date of Birth: [birth year]   Condition Code 44 given:  Yes Patient signature on Condition Code 44 notice:  Yes Documentation of 2 MD's agreement:  Yes Code 44 added to claim:  Yes    Kerin Salen, RN 10/04/2021, 9:47 AM

## 2021-10-04 NOTE — TOC Transition Note (Signed)
Transition of Care [facility]) - CM/SW Discharge Note   Patient Details  Name: Sherry Owens MRN: [id number] Date of Birth: [birth year]  [facility] CM/SW Contact:  Kerin Salen, RN Phone Number: 10/04/2021, 10:37 AM   Clinical Narrative:  Patient to discharge home with family transport. TOC assisted with successful activation of $10 Eliquis co-payment coupon and patient also given 30-day free Eliquis coupon. Patient understands to present coupons to the pharmacy staff at the time of pickup.          Patient Goals and CMS Choice        Discharge Placement                       Discharge Plan and Services                                     Social Determinants of Health (SDOH) Interventions     Readmission Risk Interventions No flowsheet data found.

## 2021-10-04 NOTE — Progress Notes (Signed)
*  PRELIMINARY RESULTS* Echocardiogram 2D Echocardiogram has been performed.  Sherry Owens 10/04/2021, 9:42 AM

## 2021-10-04 NOTE — ED Notes (Signed)
Breakfast tray delivered

## 2021-10-04 NOTE — Care Management Obs Status (Signed)
[facility] NOTIFICATION   Patient Details  Name: Sherry Owens MRN: [id number] Date of Birth: 1941-07-19   Medicare Observation Status Notification Given:       Kerin Salen, RN 10/04/2021, 9:46 AM

## 2021-10-04 NOTE — ED Notes (Signed)
Echo at bedside

## 2021-10-04 NOTE — Consult Note (Signed)
[facility] Consultation Note  Patient ID: Sherry Owens, MRN: [id number], DOB/AGE: November 24, 1941 80 y.o. Admit date: 10/03/2021   Date of Consult: 10/04/2021 Primary Physician: Marinda Elk, MD   Chief Complaint:  Chief Complaint  Patient presents with   Palpitations   Reason for Consult:  Atrial fibrillation  HPI: 80 y.o. female with known hypertension and borderline hyperlipidemia having episode of new onset of irregular heartbeat weakness fatigue and shortness of breath.  She was seen in the emergency room at which time she had an EKG showing atrial fibrillation with rapid ventricular rate and nonspecific ST and T wave changes.  She has spontaneously converted to normal sinus rhythm since with significant improvement of symptoms and feeling well at this time.  There was no evidence of chest discomfort or congestive heart failure type symptoms although she did have a chest x-ray showing some vascular congestion.  Glomerular filtration rate was 59 troponin was 11/38 and BNP were 338.  The patient now feels quite well with no further evidence of significant issues including acute coronary syndrome and/or significant heart failure.  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   Depression    Genital herpes simplex    GERD (gastroesophageal reflux disease)    Hypertension    Osteoporosis       Surgical History:  Past Surgical History:  Procedure Laterality Date   ABDOMINAL HYSTERECTOMY     COLONOSCOPY     COLONOSCOPY WITH PROPOFOL N/A 05/04/2018   Procedure: COLONOSCOPY WITH PROPOFOL;  Surgeon: Toledo, Benay Pike, MD;  Location: [facility] ENDOSCOPY;  Service: Gastroenterology;  Laterality: N/A;    ESOPHAGOGASTRODUODENOSCOPY (EGD) WITH PROPOFOL N/A 05/04/2018   Procedure: ESOPHAGOGASTRODUODENOSCOPY (EGD) WITH PROPOFOL;  Surgeon: Toledo, Benay Pike, MD;  Location: [facility] ENDOSCOPY;  Service: Gastroenterology;  Laterality: N/A;   JOINT REPLACEMENT Bilateral    bilateral total knees: 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 Authorizing Provider  alendronate (FOSAMAX) 70 MG tablet Take 70 mg by mouth once a week. Take with a full glass of water on an empty stomach.   Yes [provider]  losartan (COZAAR) 25 MG tablet Take 50 mg by mouth daily. 08/20/21  Yes [provider]  vitamin B-12 (CYANOCOBALAMIN) 1000 MCG tablet Take 1,000 mcg by mouth daily.   Yes [provider]  Vitamin D, Ergocalciferol, (DRISDOL) 1.25 MG (50000 UNIT) CAPS capsule Take 50,000 Units by mouth once a week. 09/24/21  Yes [provider]  acetaminophen (TYLENOL) 325 MG tablet Take 2 tablets (650 mg total) by mouth every 6 (six) hours as needed for mild pain. 09/12/18   Sakai, Isami, DO  escitalopram (LEXAPRO) 20 MG tablet Take 20 mg by mouth daily. Patient not taking: Reported on 10/03/2021    [provider]  hydrALAZINE (APRESOLINE) 25 MG tablet Take 1 tablet (25 mg total)  by mouth 3 (three) times daily. Patient not taking: No sig reported 03/26/21   Nicole Kindred A, DO  isosorbide mononitrate (IMDUR) 30 MG 24 hr tablet Take 30 mg by mouth daily. Patient not taking: Reported on 10/03/2021    [provider]  LORazepam (ATIVAN) 0.5 MG tablet Take 0.5 mg by mouth daily at 8 pm.     [provider]  meloxicam (MOBIC) 15 MG tablet Take 15 mg by mouth daily.    [provider]  omeprazole (PRILOSEC) 20 MG capsule Take 20 mg by mouth daily.    [provider]  traMADol (ULTRAM) 50 MG tablet Take 1 tablet (50 mg total) by mouth every 6 (six) hours as needed. Patient not taking: No sig reported 05/01/21   Sherrie George B, FNP  valACYclovir (VALTREX) 500 MG tablet Take 500 mg by mouth daily.     [provider]    Inpatient Medications:   apixaban  5 mg Oral BID   diltiazem  180 mg Oral Daily   losartan  50 mg Oral Daily   pantoprazole (PROTONIX) IV  40 mg Intravenous Q24H   valACYclovir  500 mg Oral Daily     Allergies:  Allergies  Allergen Reactions   Lactose Intolerance (Gi) Diarrhea    Stomach upset and diarrhea    Social History   Socioeconomic History   Marital status: Single    Spouse name: Not on file   Number of children: Not on file   Years of education: Not on file   Highest education level: Not on file  Occupational History   Occupation: Armed forces logistics/support/administrative officer    Comment: retired / disability  Tobacco Use   Smoking status: Never   Smokeless tobacco: Never  Vaping Use   Vaping Use: Never used  Substance and Sexual Activity   Alcohol use: No   Drug use: Never   Sexual activity: Not on file  Other Topics Concern   Not on file  Social History Narrative   Not on file   Social Determinants of Health   Financial Resource Strain: Not on file  Food Insecurity: Not on file  Transportation Needs: Not on file  Physical Activity: Not on file  Stress: Not on file  Social Connections: Not on file  Intimate Partner Violence: Not on file     Family History  Problem Relation Age of Onset   Breast cancer Maternal Aunt    Pancreatic cancer Mother    Colon cancer Father    Asthma Sister    Pancreatic cancer Brother    COPD Sister    Hypertension Brother      Review of Systems Positive for palpitations Negative for: General:  chills, fever, night sweats or weight changes.  Cardiovascular: PND orthopnea  syncope dizziness  Dermatological skin lesions rashes Respiratory: Cough congestion Urologic: Frequent urination urination at night and hematuria Abdominal: negative for nausea, vomiting, diarrhea, bright red blood per rectum, melena, or hematemesis Neurologic: negative for visual changes, and/or hearing changes  All other systems reviewed and are otherwise negative except as noted above.  Labs: No results for input(s): CKTOTAL, CKMB, TROPONINI in the last 72 hours. Lab Results  Component Value Date   WBC 9.0 10/03/2021   HGB 12.9 10/03/2021   HCT 37.1 10/03/2021   MCV 100.8 (H) 10/03/2021   PLT 144 (L) 10/03/2021    Recent Labs  Lab 10/03/21 1934  NA 138  K 3.7  CL 112*  CO2 23  BUN 19  CREATININE 0.98  CALCIUM 8.4*  PROT 6.7  BILITOT 0.8  ALKPHOS 83  ALT 13  AST 21  GLUCOSE 135*   No results found for: CHOL, HDL, LDLCALC, TRIG No results found for: DDIMER  Radiology/Studies:  DG Chest Portable 1 View  Result Date: 10/03/2021 CLINICAL DATA:  Atrial fibrillation EXAM: PORTABLE CHEST 1 VIEW COMPARISON:  03/25/2021 FINDINGS: Single frontal view of the chest demonstrates a stable cardiac silhouette. There is increased central vascular congestion. Patchy consolidation at the left lung base may reflect airspace disease or atelectasis, given underlying chronic elevation of the left hemidiaphragm. No effusion or pneumothorax. IMPRESSION: 1. Central vascular congestion. 2. Left basilar consolidation, favor atelectasis. Electronically Signed   By: Randa Ngo M.D.   On: 10/03/2021 19:40    EKG: Atrial fibrillation with rapid ventricular rate nonspecific ST changes  Weights: Filed Weights   10/03/21 1902  Weight: 86.2 kg     Physical Exam: Blood pressure 112/65, pulse (!) 53, temperature 97.9 F (36.6 C), temperature source Oral, resp. rate 15, height 5\' 5"  (1.651 m), weight 86.2 kg, SpO2 93 %. Body mass index is 31.62 kg/m. General: Well developed, well nourished,  in no acute distress. Head eyes ears nose throat: Normocephalic, atraumatic, sclera non-icteric, no xanthomas, nares are without discharge. No apparent thyromegaly and/or mass  Lungs: Normal respiratory effort.  no wheezes, no rales, no rhonchi.  Heart: RRR with normal S1 S2. no murmur gallop, no rub, PMI is normal size and placement, carotid upstroke normal without bruit, jugular venous pressure is normal Abdomen: Soft, non-tender, non-distended with normoactive bowel sounds. No hepatomegaly. No rebound/guarding. No obvious abdominal masses. Abdominal aorta is normal size without bruit Extremities: No edema. no cyanosis, no clubbing, no ulcers  Peripheral : 2+ bilateral upper extremity pulses, 2+ bilateral femoral pulses, 2+ bilateral dorsal pedal pulse Neuro: Alert and oriented. No facial asymmetry. No focal deficit. Moves all extremities spontaneously. Musculoskeletal: Normal muscle tone without kyphosis Psych:  Responds to questions appropriately with a normal affect.    Assessment: 80 year old female with hypertension hyperlipidemia and new onset atrial fibrillation with rapid ventricular rate now spontaneously converted to normal sinus rhythm without evidence of significant congestive heart failure or acute coronary syndrome  Plan: 1.  Convert diltiazem drip to oral diltiazem 180 mg p.o. daily for heart rate control and maintenance of normal rhythm 2.  Anticoagulation for further risk reduction and stroke with atrial fibrillation with elevated CHADS2 score.  Will use Eliquis 5 mg twice per day 3.  Discontinuation of aspirin due to concerns of bleeding complications with addition of anticoagulation as per above 4.  Continue hypertension control with losartan for goal systolic blood pressure below 130 mm 5.  Begin ambulation and follow-up for improvements of symptoms and okay for discharge home from cardiac standpoint with follow-up in 1 week  Signed, Corey Skains M.D. [facility] 10/04/2021, 6:31 AM

## 2021-10-05 LAB — T3, FREE: T3, Free: 1.8 pg/mL — ABNORMAL LOW (ref 2.0–4.4)

## 2021-10-06 LAB — ECHOCARDIOGRAM COMPLETE
AR max vel: 1.79 cm2
AV Peak grad: 6.2 mmHg
Ao pk vel: 1.24 m/s
Area-P 1/2: 3.06 cm2
Height: 65 in
S' Lateral: 3.1 cm
Weight: 3040.58 oz

## 2021-10-08 ENCOUNTER — Other Ambulatory Visit: Payer: Self-pay | Admitting: Physician Assistant

## 2021-10-08 DIAGNOSIS — S22000S Wedge compression fracture of unspecified thoracic vertebra, sequela: Secondary | ICD-10-CM

## 2022-01-23 ENCOUNTER — Emergency Department
Admission: EM | Admit: 2022-01-23 | Discharge: 2022-01-24 | Disposition: A | Discharge status: Home / Self Care | Payer: Medicare Other | Attending: Emergency Medicine | Admitting: Emergency Medicine

## 2022-01-23 ENCOUNTER — Emergency Department: Payer: Medicare Other

## 2022-01-23 DIAGNOSIS — Z79899 Other long term (current) drug therapy: Secondary | ICD-10-CM | POA: Insufficient documentation

## 2022-01-23 DIAGNOSIS — I10 Essential (primary) hypertension: Secondary | ICD-10-CM | POA: Insufficient documentation

## 2022-01-23 DIAGNOSIS — Z96653 Presence of artificial knee joint, bilateral: Secondary | ICD-10-CM | POA: Insufficient documentation

## 2022-01-23 DIAGNOSIS — S32000A Wedge compression fracture of unspecified lumbar vertebra, initial encounter for closed fracture: Secondary | ICD-10-CM

## 2022-01-23 DIAGNOSIS — Z7901 Long term (current) use of anticoagulants: Secondary | ICD-10-CM | POA: Diagnosis not present

## 2022-01-23 DIAGNOSIS — S0990XA Unspecified injury of head, initial encounter: Secondary | ICD-10-CM | POA: Diagnosis present

## 2022-01-23 DIAGNOSIS — S0001XA Abrasion of scalp, initial encounter: Secondary | ICD-10-CM

## 2022-01-23 DIAGNOSIS — Z8509 Personal history of malignant neoplasm of other digestive organs: Secondary | ICD-10-CM | POA: Insufficient documentation

## 2022-01-23 DIAGNOSIS — W19XXXA Unspecified fall, initial encounter: Secondary | ICD-10-CM

## 2022-01-23 DIAGNOSIS — S0003XA Contusion of scalp, initial encounter: Secondary | ICD-10-CM | POA: Insufficient documentation

## 2022-01-23 DIAGNOSIS — W01118A Fall on same level from slipping, tripping and stumbling with subsequent striking against other sharp object, initial encounter: Secondary | ICD-10-CM | POA: Diagnosis not present

## 2022-01-23 DIAGNOSIS — S32028A Other fracture of second lumbar vertebra, initial encounter for closed fracture: Secondary | ICD-10-CM | POA: Insufficient documentation

## 2022-01-23 MED ORDER — ACETAMINOPHEN 500 MG PO TABS
1000.0000 mg | ORAL_TABLET | Freq: Once | ORAL | Status: AC
Start: 2022-01-24 — End: 2022-01-24
  Administered 2022-01-24: 1000 mg via ORAL
  Filled 2022-01-23: qty 2

## 2022-01-23 NOTE — ED Provider Notes (Signed)
[facility] Provider Note    Event Date/Time   First MD Initiated Contact with Patient 01/23/22 2300     (approximate)   History   Fall   HPI  Sherry Owens is a 81 y.o. female with history of hypertension, rheumatoid arthritis, atrial fibrillation on Eliquis who presents to the emergency department with EMS after she had a fall and hit her head.  Patient states that she was trying to carry a crockpot that had a ham in it.  She states that the crockpot was too heavy for her to hold and she was worried that she was going to drop it and started backing up in her kitchen.  States she lost her balance and hit her head on the corner of the counter.  There was no loss of consciousness.  She states she had a tetanus vaccine a couple of months ago.  She denies any preceding chest pain, shortness of breath or dizziness that led to her fall.  She states that she did get a small amount of the juices in the crockpot that splashed onto her chest but no other burns.   History provided by patient and EMS.    Past Medical History:  Diagnosis Date   Anginal pain ([place]) 2019   currently has pain w/ stress   Anxiety    Arthritis    Cancer ([place])    cancer within appendix per pt   Collagen vascular disease ([place])    RA   Depression    Genital herpes simplex    GERD (gastroesophageal reflux disease)    Hypertension    Osteoporosis     Past Surgical History:  Procedure Laterality Date   ABDOMINAL HYSTERECTOMY     COLONOSCOPY     COLONOSCOPY WITH PROPOFOL N/A 05/04/2018   Procedure: COLONOSCOPY WITH PROPOFOL;  Surgeon: Toledo, Benay Pike, MD;  Location: [facility] ENDOSCOPY;  Service: Gastroenterology;  Laterality: N/A;   ESOPHAGOGASTRODUODENOSCOPY (EGD) WITH PROPOFOL N/A 05/04/2018   Procedure: ESOPHAGOGASTRODUODENOSCOPY (EGD) WITH PROPOFOL;  Surgeon: Toledo, Benay Pike, MD;  Location: [facility] ENDOSCOPY;  Service: Gastroenterology;  Laterality: N/A;   JOINT REPLACEMENT Bilateral     bilateral total knees: 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 Authorizing Provider  acetaminophen (TYLENOL) 325 MG tablet Take 2 tablets (650 mg total) by mouth every 6 (six) hours as needed for mild pain. 09/12/18   Lysle Pearl, Isami, DO  alendronate (FOSAMAX) 70 MG tablet Take 70 mg by mouth once a week. Take with a full glass of water on an empty stomach.    [provider]  apixaban (ELIQUIS) 5 MG TABS tablet Take 1 tablet (5 mg total) by mouth 2 (two) times daily. 10/04/21   Loletha Grayer, MD  diltiazem (CARDIZEM CD) 180 MG 24 hr capsule Take 1 capsule (180 mg total) by mouth daily. 10/04/21   Loletha Grayer, MD  LORazepam (ATIVAN) 0.5 MG tablet Take 0.5 mg by mouth daily at 8 pm.     [provider]  losartan (COZAAR) 25 MG tablet Take 50 mg by mouth daily. 08/20/21   [provider]  omeprazole (PRILOSEC) 20 MG capsule Take 20 mg by mouth daily.  [provider]  valACYclovir (VALTREX) 500 MG tablet Take 500 mg by mouth daily.     [provider]  vitamin B-12 (CYANOCOBALAMIN) 1000 MCG tablet Take 1,000 mcg by mouth daily.    [provider]  Vitamin D, Ergocalciferol, (DRISDOL) 1.25 MG (50000 UNIT) CAPS capsule Take 50,000 Units by mouth once a week. 09/24/21   [provider]    Physical Exam   Triage Vital Signs: ED Triage Vitals  Enc Vitals Group     BP 01/23/22 2301 (!) 185/90     Pulse Rate 01/23/22 2301 (!) 55     Resp 01/23/22 2301 18     Temp 01/23/22 2301 98.4 F (36.9 C)     Temp Source 01/23/22 2301 Oral     SpO2 01/23/22 2301 97 %     Weight 01/23/22 2306 180 lb (81.6 kg)     Height 01/23/22 2306 5\' 5"  (1.651  m)     Head Circumference --      Peak Flow --      Pain Score 01/23/22 2302 8     Pain Loc --      Pain Edu? --      Excl. in [place]? --     Most recent vital signs: Vitals:   01/23/22 2301 01/24/22 0000  BP: (!) 185/90 (!) 169/76  Pulse: (!) 55 (!) 55  Resp: 18   Temp: 98.4 F (36.9 C)   SpO2: 97% 98%     CONSTITUTIONAL: Alert and oriented and responds appropriately to questions. Well-appearing; well-nourished; GCS 15 HEAD: Normocephalic; patient has abrasion and hematoma to the right posterior scalp but no laceration that needs repair EYES: Conjunctivae clear, PERRL, EOMI, patient has chronic strabismus of the right eye ENT: normal nose; no rhinorrhea; moist mucous membranes; pharynx without lesions noted; no dental injury; no septal hematoma, no epistaxis; no facial deformity or bony tenderness NECK: Supple, no midline spinal tenderness, step-off or deformity; trachea midline CARD: RRR; S1 and S2 appreciated; no murmurs, no clicks, no rubs, no gallops RESP: Normal chest excursion without splinting or tachypnea; breath sounds clear and equal bilaterally; no wheezes, no rhonchi, no rales; no hypoxia or respiratory distress CHEST:  chest wall stable, no crepitus or ecchymosis or deformity, nontender to palpation; no flail chest ABD/GI: Normal bowel sounds; non-distended; soft, non-tender, no rebound, no guarding; no ecchymosis or other lesions noted PELVIS:  stable, nontender to palpation BACK:  The back appears normal; no midline spinal tenderness, step-off or deformity EXT: Normal ROM in all joints; non-tender to palpation; no edema; normal capillary refill; no cyanosis, no bony tenderness or bony deformity of patient's extremities, no joint effusion, compartments are soft, extremities are warm and well-perfused, no ecchymosis SKIN: Normal color for age and race; warm, no burns appreciated NEURO: No facial asymmetry, normal speech, moving all extremities equally  ED Results /  Procedures / Treatments   LABS: (all labs ordered are listed, but only abnormal results are displayed) Labs Reviewed - No data to display   EKG:  EKG Interpretation  Date/Time:  Friday January 23 2022 23:04:30 EST Ventricular Rate:  55 PR Interval:  168 QRS Duration: 78 QT Interval:  417 QTC Calculation: 399 R Axis:   -7 Text Interpretation: Sinus rhythm Low voltage, precordial leads Abnormal R-wave progression, early transition Left ventricular hypertrophy Confirmed by Pryor Curia [phone]) on 01/23/2022 11:24:19 PM          RADIOLOGY: My personal review and interpretation of imaging: CT head  and cervical spine showed no traumatic injury.  I have personally reviewed all radiology reports. DG Lumbar Spine Complete  Result Date: 01/24/2022 CLINICAL DATA:  Fall. Fall while carrying a crock pot with hot liquids. EXAM: LUMBAR SPINE - COMPLETE 4+ VIEW COMPARISON:  Lumbar radiograph 10/30/2020 FINDINGS: The bones are subjectively under mineralized. There are 5 lumbar type vertebra. Rightward curvature of the lower thoracic and upper lumbar spine. Mild L2 compression fracture with 25% loss of height of superior endplate, new from prior exam. Remaining vertebral body heights are normal. There is lower lumbar facet hypertrophy. Mild L5-S1 degenerative disc disease sacroiliac joints are congruent and normal. IMPRESSION: 1. Mild L2 compression fracture with 25% loss of height anteriorly, new from November 2021 exam, possibly acute. 2. Rightward curvature of the lower thoracic and upper lumbar spine. Mild degenerative change. 3. Subjective bony under mineralization. Electronically Signed   By: Keith Rake M.D.   On: 01/24/2022 00:44   CT Head Wo Contrast  Result Date: 01/23/2022 CLINICAL DATA:  Recent fall with headaches and neck pain, initial encounter EXAM: CT HEAD WITHOUT CONTRAST CT CERVICAL SPINE WITHOUT CONTRAST TECHNIQUE: Multidetector CT imaging of the head and cervical spine was  performed following the standard protocol without intravenous contrast. Multiplanar CT image reconstructions of the cervical spine were also generated. RADIATION DOSE REDUCTION: This exam was performed according to the departmental dose-optimization program which includes automated exposure control, adjustment of the mA and/or kV according to patient size and/or use of iterative reconstruction technique. COMPARISON:  03/25/2021 FINDINGS: CT HEAD FINDINGS Brain: No evidence of acute infarction, hemorrhage, hydrocephalus, extra-axial collection or mass lesion/mass effect. Mild atrophic changes are noted. Vascular: No hyperdense vessel or unexpected calcification. Skull: Normal. Negative for fracture or focal lesion. Sinuses/Orbits: No acute finding. Other: None. CT CERVICAL SPINE FINDINGS Alignment: Mild loss of normal cervical lordosis. Degenerative anterolisthesis of C2 on C3 is noted. Skull base and vertebrae: 7 cervical segments are well visualized. Vertebral body height is well maintained. No acute fracture or acute facet abnormality is noted. Degenerative anterolisthesis of C2 on C3 is noted. The odontoid is within normal limits. Soft tissues and spinal canal: Surrounding soft tissue structures are unremarkable. Upper chest: Visualized lung apices are within normal limits. Other: None IMPRESSION: CT of the head: Mild atrophic changes without acute abnormality. CT of the cervical spine: Degenerative changes with anterolisthesis of C3 on C4. No acute abnormality noted. Electronically Signed   By: Inez Catalina M.D.   On: 01/23/2022 23:29   CT Cervical Spine Wo Contrast  Result Date: 01/23/2022 CLINICAL DATA:  Recent fall with headaches and neck pain, initial encounter EXAM: CT HEAD WITHOUT CONTRAST CT CERVICAL SPINE WITHOUT CONTRAST TECHNIQUE: Multidetector CT imaging of the head and cervical spine was performed following the standard protocol without intravenous contrast. Multiplanar CT image reconstructions  of the cervical spine were also generated. RADIATION DOSE REDUCTION: This exam was performed according to the departmental dose-optimization program which includes automated exposure control, adjustment of the mA and/or kV according to patient size and/or use of iterative reconstruction technique. COMPARISON:  03/25/2021 FINDINGS: CT HEAD FINDINGS Brain: No evidence of acute infarction, hemorrhage, hydrocephalus, extra-axial collection or mass lesion/mass effect. Mild atrophic changes are noted. Vascular: No hyperdense vessel or unexpected calcification. Skull: Normal. Negative for fracture or focal lesion. Sinuses/Orbits: No acute finding. Other: None. CT CERVICAL SPINE FINDINGS Alignment: Mild loss of normal cervical lordosis. Degenerative anterolisthesis of C2 on C3 is noted. Skull base and vertebrae: 7 cervical segments are  well visualized. Vertebral body height is well maintained. No acute fracture or acute facet abnormality is noted. Degenerative anterolisthesis of C2 on C3 is noted. The odontoid is within normal limits. Soft tissues and spinal canal: Surrounding soft tissue structures are unremarkable. Upper chest: Visualized lung apices are within normal limits. Other: None IMPRESSION: CT of the head: Mild atrophic changes without acute abnormality. CT of the cervical spine: Degenerative changes with anterolisthesis of C3 on C4. No acute abnormality noted. Electronically Signed   By: Inez Catalina M.D.   On: 01/23/2022 23:29     PROCEDURES:  Critical Care performed: No   CRITICAL CARE Performed by: Cyril Mourning Merrit Friesen   Total critical care time: 0 minutes  Critical care time was exclusive of separately billable procedures and treating other patients.  Critical care was necessary to treat or prevent imminent or life-threatening deterioration.  Critical care was time spent personally by me on the following activities: development of treatment plan with patient and/or surrogate as well as nursing,  discussions with consultants, evaluation of patient's response to treatment, examination of patient, obtaining history from patient or surrogate, ordering and performing treatments and interventions, ordering and review of laboratory studies, ordering and review of radiographic studies, pulse oximetry and re-evaluation of patient's condition.   Procedures    IMPRESSION / MDM / ASSESSMENT AND PLAN / ED COURSE  I reviewed the triage vital signs and the nursing notes.  Patient here after mechanical fall.  Has laceration to the posterior scalp.  Tetanus vaccine up-to-date.     DIFFERENTIAL DIAGNOSIS (includes but not limited to):   Mechanical fall, scalp laceration, concussion, intracranial hemorrhage, skull fracture, cervical spine fracture   PLAN: We will obtain CT of the head and cervical spine.  We will clean wounds to see if there is anything that needs repair today.  Her tetanus vaccine is up-to-date.  No other sign of traumatic injury on exam.  She denies any preceding symptoms that led to her fall.   MEDICATIONS GIVEN IN ED: Medications  acetaminophen (TYLENOL) tablet 1,000 mg (1,000 mg Oral Given 01/24/22 0008)     ED COURSE: CT head and cervical spine images reviewed by myself and radiologist and show no intracranial hemorrhage, skull fracture, cervical spine fracture.  I have looked at the wound on her scalp and do not see any laceration that needs repair but we will clean the wound better.  She is now complaining of lower back pain and has some midline spinal tenderness but no step-off or deformity.  She has no numbness or weakness on exam.  We will obtain x-rays of the lower back and give Tylenol for pain.  X-ray of the lumbar spine images were reviewed by myself and radiologist and shows a L2 compression fracture.  I suspect that this is acute.  There is only about 25% loss of height and no retropulsion.  She is neurologically intact here and able to ambulate.  Will give  outpatient neurosurgery follow-up information.  Will discharge with brief course of pain medication for home.  Given she is not having significant pain, I do not feel she needs to wait in the emergency department for hours waiting for a TLSO brace.  Family is comfortable with this plan.  At this time, I do not feel there is any life-threatening condition present. I reviewed all nursing notes, vitals, pertinent previous records.  All lab and urine results, EKGs, imaging ordered have been independently reviewed and interpreted by myself.  I reviewed all  available radiology reports from any imaging ordered this visit.  Based on my assessment, I feel the patient is safe to be discharged home without further emergent workup and can continue workup as an outpatient as needed. Discussed all findings, treatment plan as well as usual and customary return precautions with patient and family.  They verbalize understanding and are comfortable with this plan.  Outpatient follow-up has been provided as needed.  All questions have been answered.    CONSULTS: No emergent neurosurgical consultation needed given patient is neurologically intact and compression fracture is mild with no retropulsion or significant pain.   OUTSIDE RECORDS REVIEWED: Reviewed patient's last admission on 10/03/2021 to 10/04/2021 where she was admitted for A-fib with RVR.         FINAL CLINICAL IMPRESSION(S) / ED DIAGNOSES   Final diagnoses:  Fall, initial encounter  Injury of head, initial encounter  Lumbar compression fracture, closed, initial encounter ([place])     Rx / DC Orders   ED Discharge Orders          Ordered    traMADol (ULTRAM) 50 MG tablet  Every 8 hours PRN        01/24/22 0103    ondansetron (ZOFRAN-ODT) 4 MG disintegrating tablet  Every 6 hours PRN        01/24/22 0103             Note:  This document was prepared using Dragon voice recognition software and may include unintentional dictation  errors.   Liliauna Santoni, Delice Bison, DO 01/24/22 [phone]

## 2022-01-23 NOTE — ED Notes (Signed)
Report given to Julie RN.

## 2022-01-23 NOTE — ED Triage Notes (Addendum)
Per EMS report, patient fell while carrying a crockpot with hot liquid. Patient states the crockpot was too heavy and she just fell. Patient denies LOC. Fall was witnessed. Per EMS report, patient has laceration on back of head from hit an organ.  Patient is on Eliquis.Patient c/o feeling dizzy to EMTs. Hot liquid from crock pot splashed on right collar bone which is faintly red.

## 2022-01-24 ENCOUNTER — Emergency Department: Payer: Medicare Other

## 2022-01-24 ENCOUNTER — Other Ambulatory Visit: Payer: Medicare Other

## 2022-01-24 DIAGNOSIS — S32028A Other fracture of second lumbar vertebra, initial encounter for closed fracture: Secondary | ICD-10-CM | POA: Diagnosis not present

## 2022-01-24 MED ORDER — TRAMADOL HCL 50 MG PO TABS: 50.0000 mg | ORAL_TABLET | Freq: Three times a day (TID) | ORAL | 0 refills | Status: AC | PRN

## 2022-01-24 MED ORDER — ONDANSETRON 4 MG PO TBDP: 4.0000 mg | ORAL_TABLET | Freq: Four times a day (QID) | ORAL | 0 refills | Status: DC | PRN

## 2022-01-24 NOTE — Discharge Instructions (Signed)

## 2022-01-24 NOTE — ED Notes (Signed)
Pt ambulated in hallway without assistance.  Pt denies dizziness or excessive pain while walking.  Dr. Leonides Schanz aware.

## 2022-10-06 IMAGING — CT CT CERVICAL SPINE W/O CM
3 of 5 series · 9 of 33 positions shown, 11 images · non-contrast
Comparison: 03/25/2021

CLINICAL DATA: Recent fall with headaches and neck pain, initial
encounter



[Series 4: sagittal bone · sagittal · 0.28mm/px · 5 of 72 slices shown, 6 images]
[im 24/72  bone]
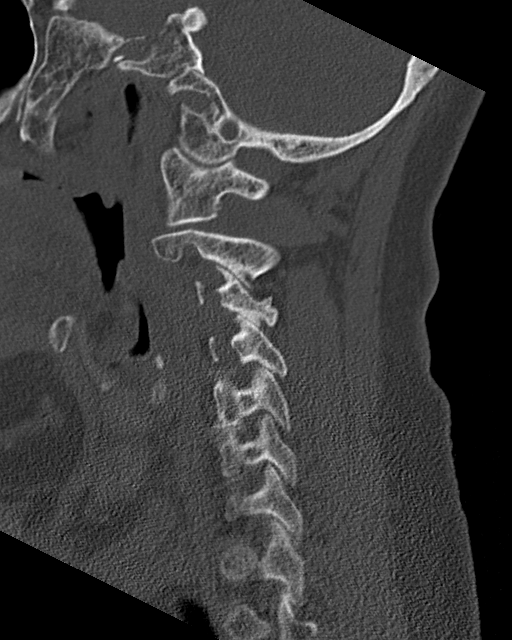
[im 30/72  bone]
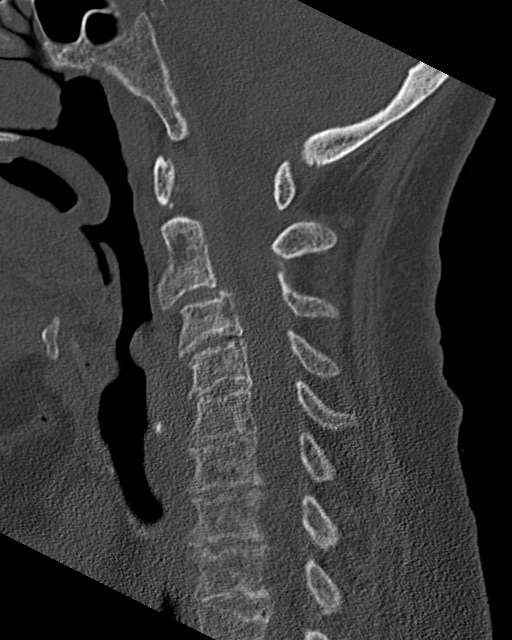
[im 36/72  soft-tissue]
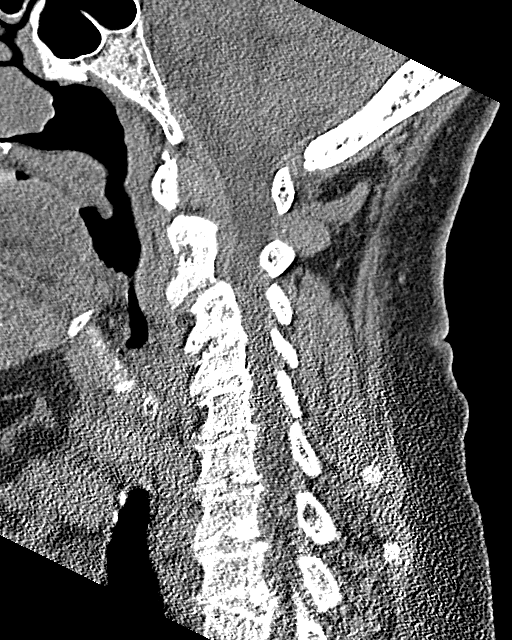
[im 36/72  bone]
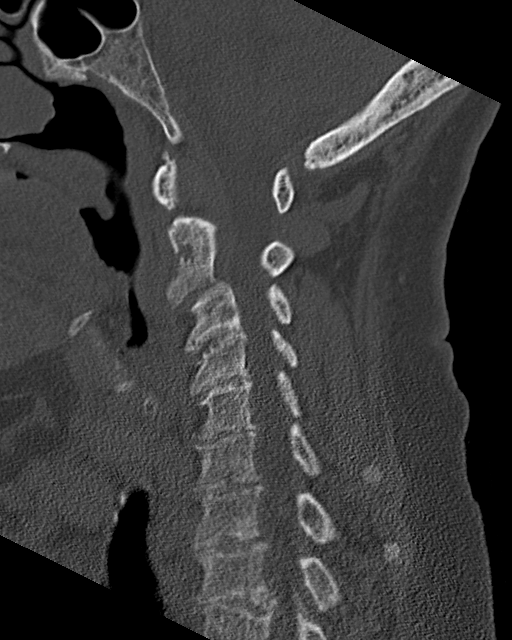
[im 42/72  bone]
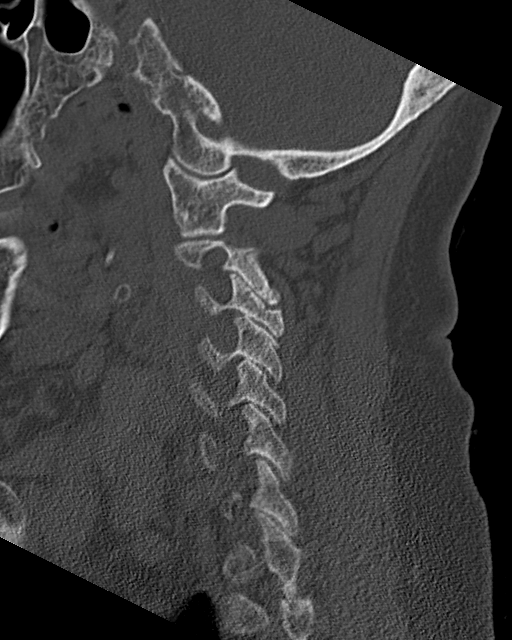
[im 48/72  bone]
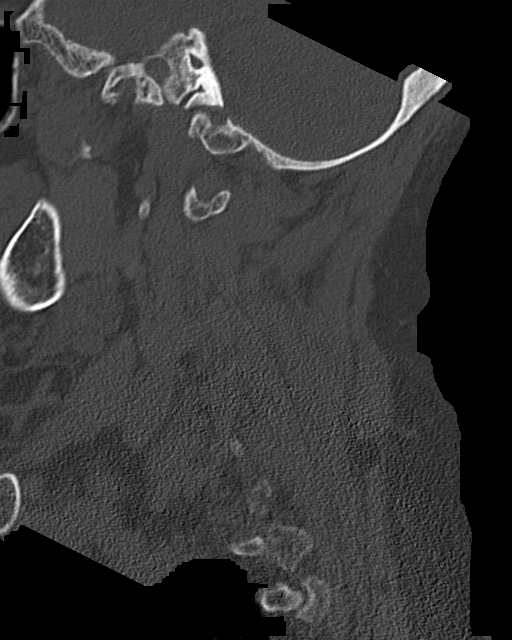

[Series 5: coronal bone · coronal · 0.25mm/px · 3 of 49 slices shown]
[im 10/49  bone]
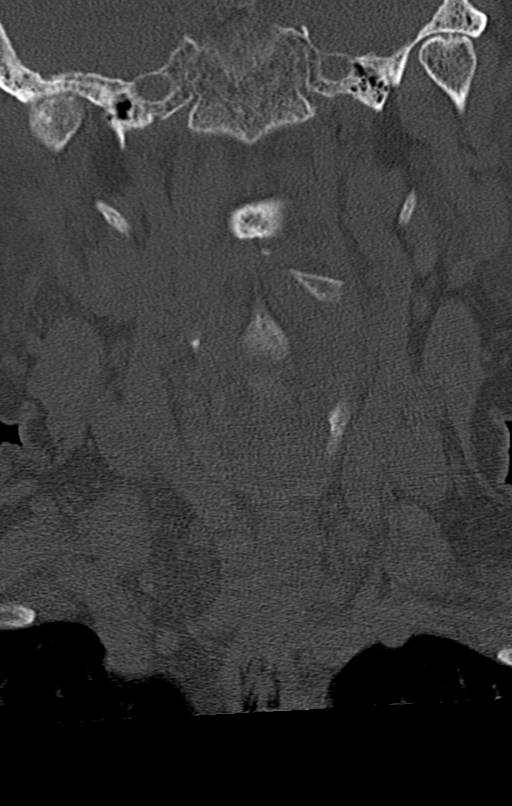
[im 20/49  bone]
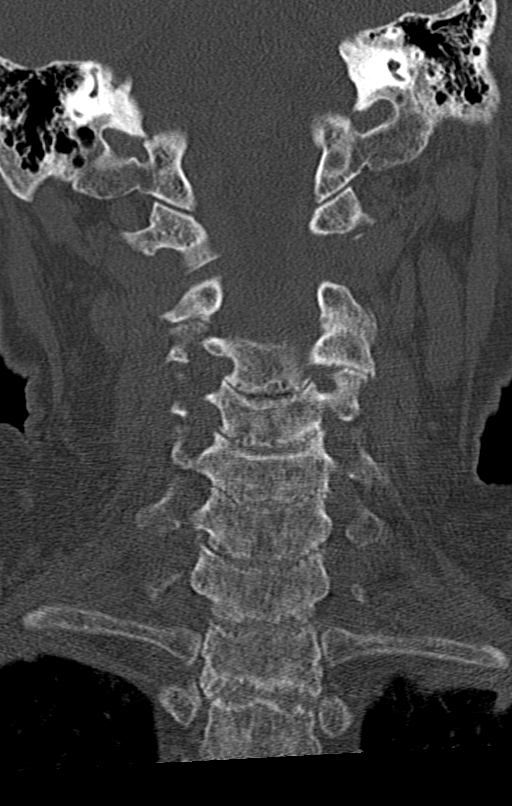
[im 29/49  bone]
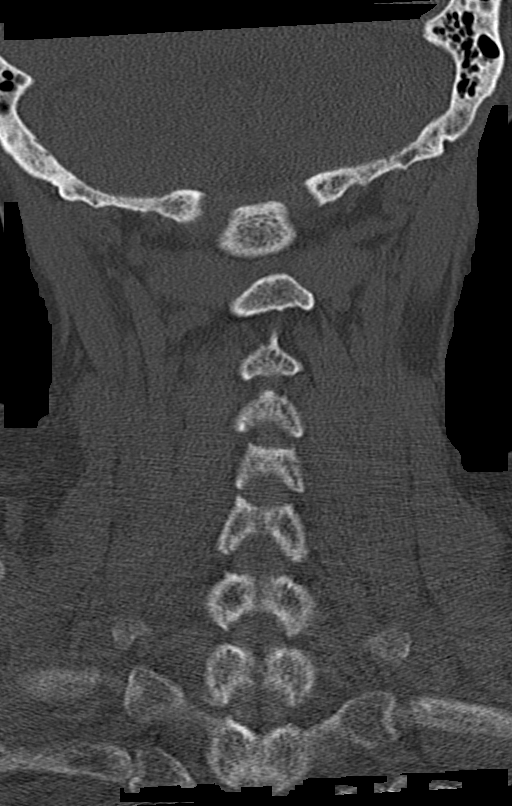

[Series 6: orthogonal bone · axial · 0.28mm/px · z∈[+219,+219]mm · 1 of 100 slices shown, 2 images]
[im 50/100  soft-tissue]
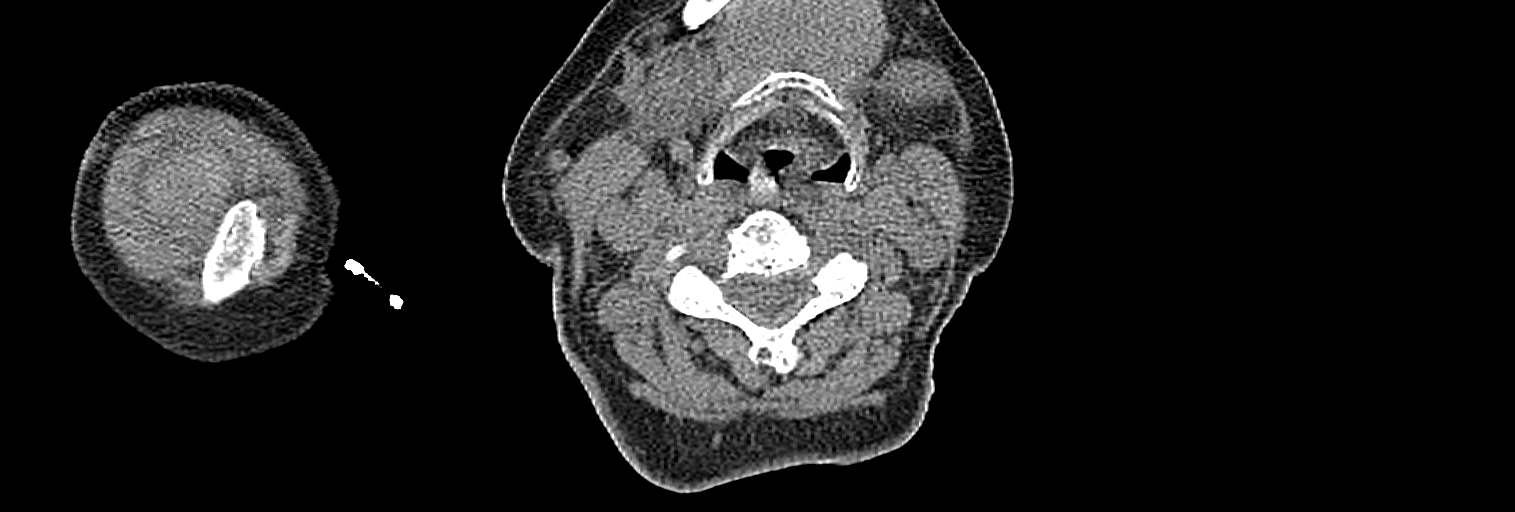
[im 50/100  bone]
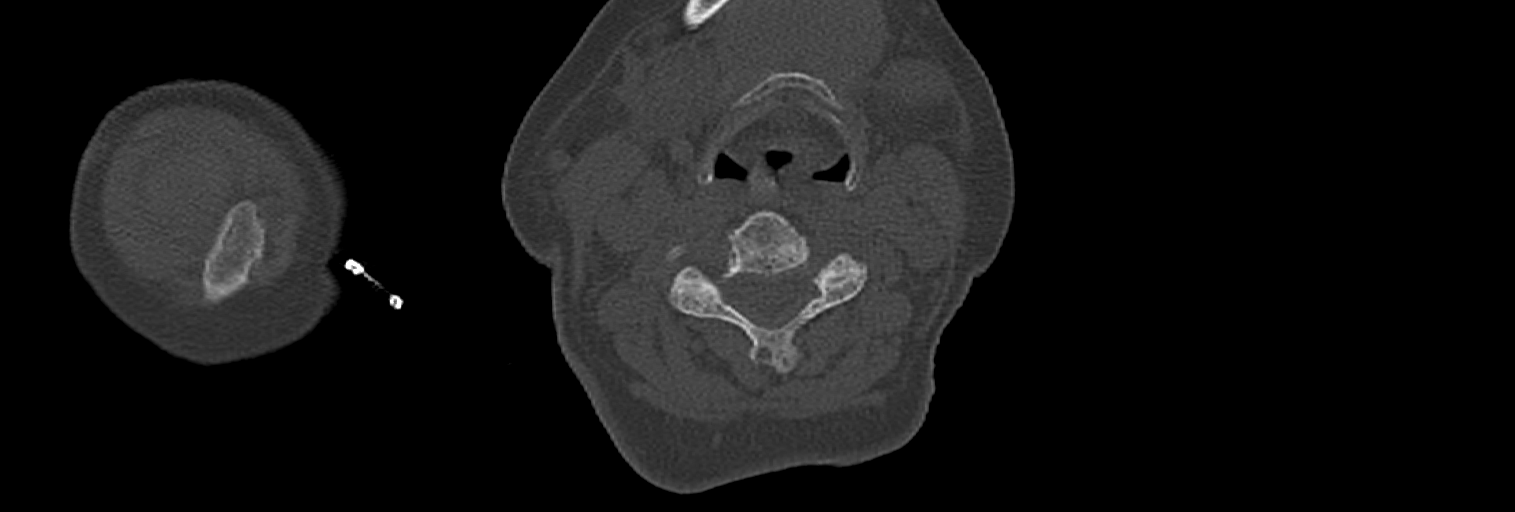

[9 of 33 positions shown; findings below may reference images not displayed]

FINDINGS: CT HEAD FINDINGS

Brain: No evidence of acute infarction, hemorrhage, hydrocephalus,
extra-axial collection or mass lesion/mass effect. Mild atrophic
changes are noted.

Vascular: No hyperdense vessel or unexpected calcification.

Skull: Normal. Negative for fracture or focal lesion.

Sinuses/Orbits: No acute finding.

Other: None.

CT CERVICAL SPINE FINDINGS

Alignment: Mild loss of normal cervical lordosis. Degenerative
anterolisthesis of C2 on C3 is noted.

Skull base and vertebrae: 7 cervical segments are well visualized.
Vertebral body height is well maintained. No acute fracture or acute
facet abnormality is noted. Degenerative anterolisthesis of C2 on C3
is noted. The odontoid is within normal limits.

Soft tissues and spinal canal: Surrounding soft tissue structures
are unremarkable.

Upper chest: Visualized lung apices are within normal limits.

Other: None
IMPRESSION: CT of the head: Mild atrophic changes without acute abnormality.

CT of the cervical spine: Degenerative changes with anterolisthesis
of C3 on C4. No acute abnormality noted.

## 2022-10-06 IMAGING — CT CT HEAD W/O CM
4 series · 16 of 47 positions shown, 18 images · non-contrast
Comparison: 03/25/2021

CLINICAL DATA: Recent fall with headaches and neck pain, initial
encounter



[Series 2: head wo · axial · 0.42mm/px · z∈[+313,+428]mm · 7 of 31 slices shown, 9 images]
[im 4/31  brain]
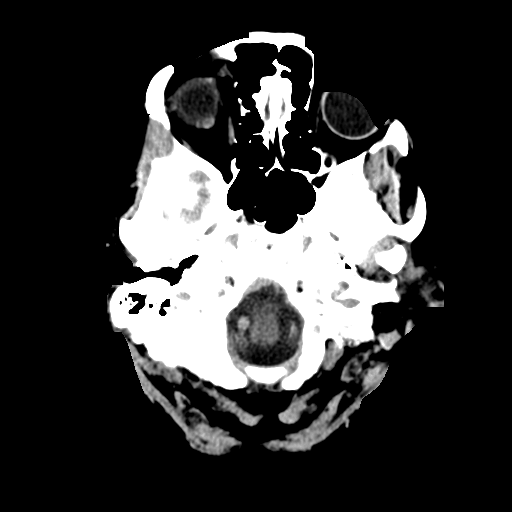
[im 4/31  bone]
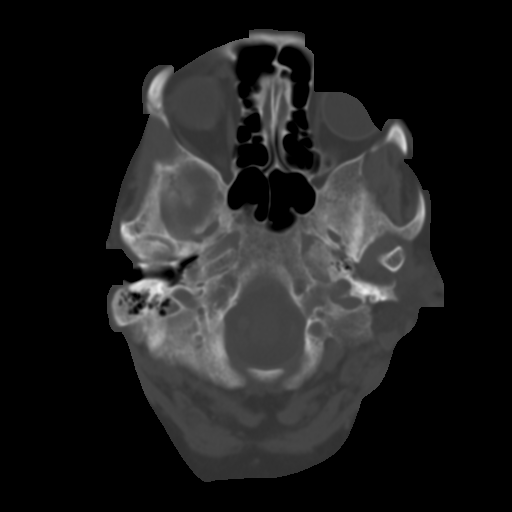
[im 8/31  brain]
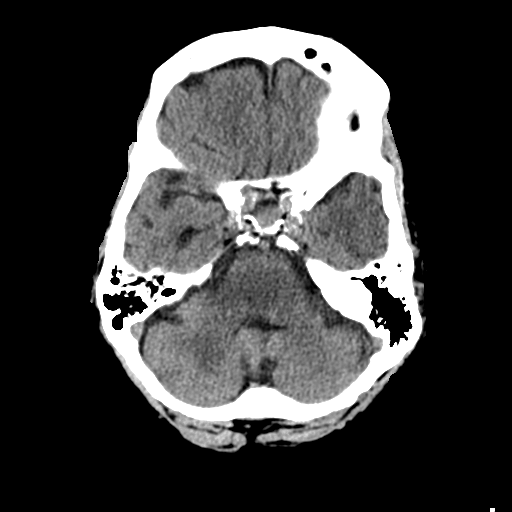
[im 12/31  brain]
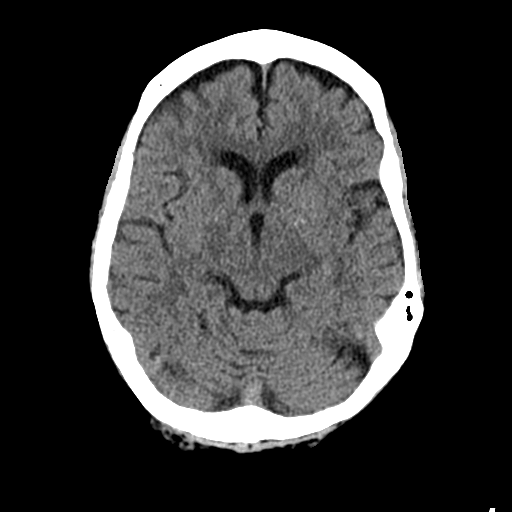
[im 16/31  brain]
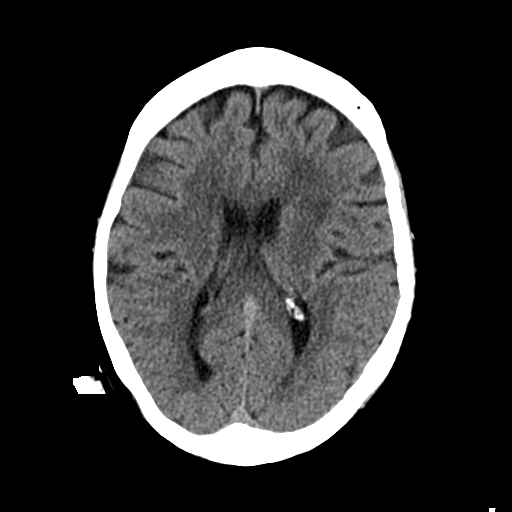
[im 19/31  brain]
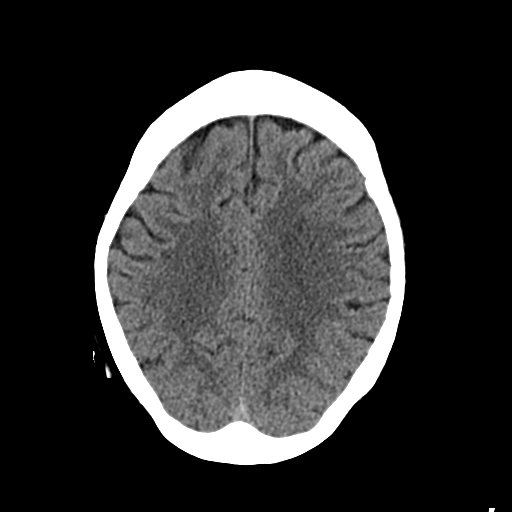
[im 19/31  bone]
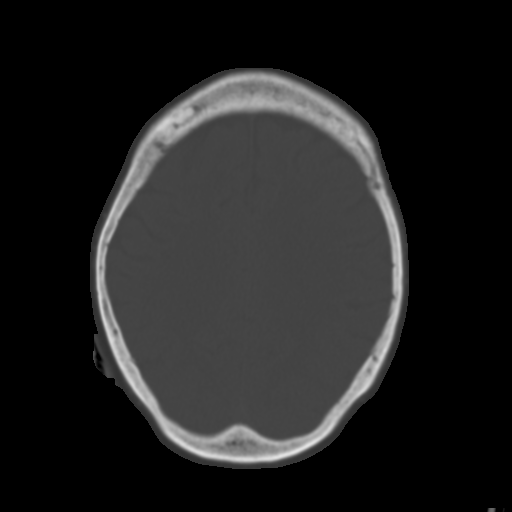
[im 23/31  brain]
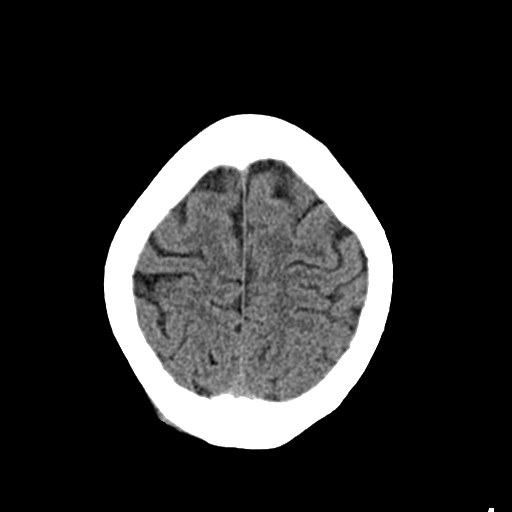
[im 27/31  brain]
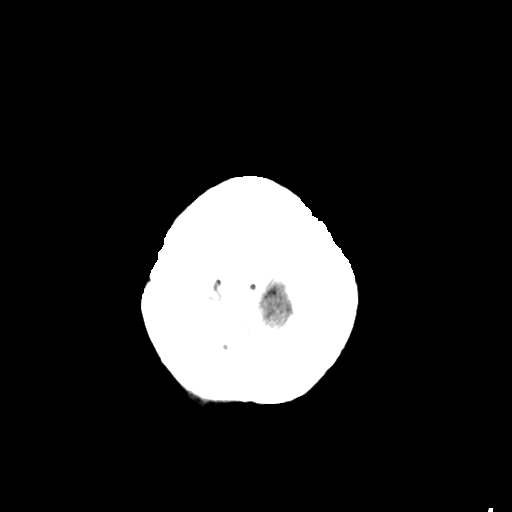

[Series 3: head bone · axial · 0.42mm/px · z∈[+312,+342]mm · 3 of 77 slices shown]
[im 8/77  bone]
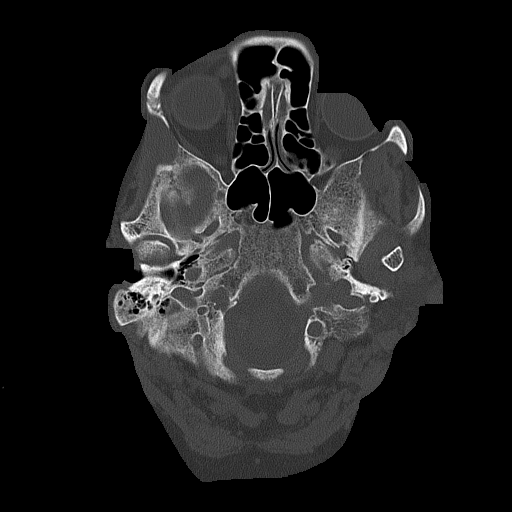
[im 16/77  bone]
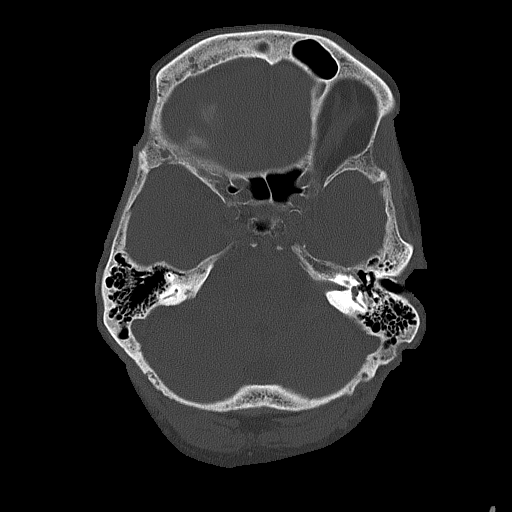
[im 23/77  bone]
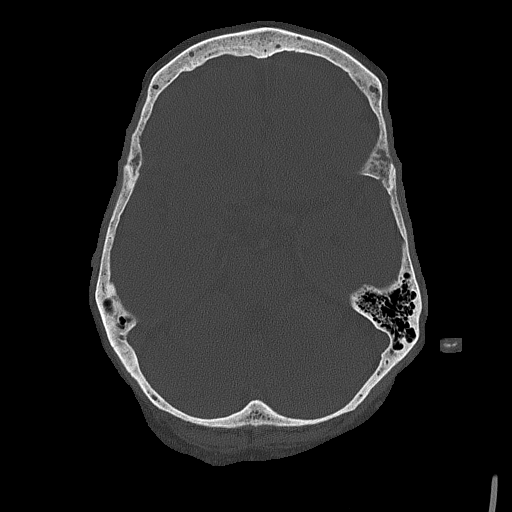

[Series 4: coronal soft tissue · coronal · 0.33mm/px · 3 of 67 slices shown]
[im 23/67  brain]
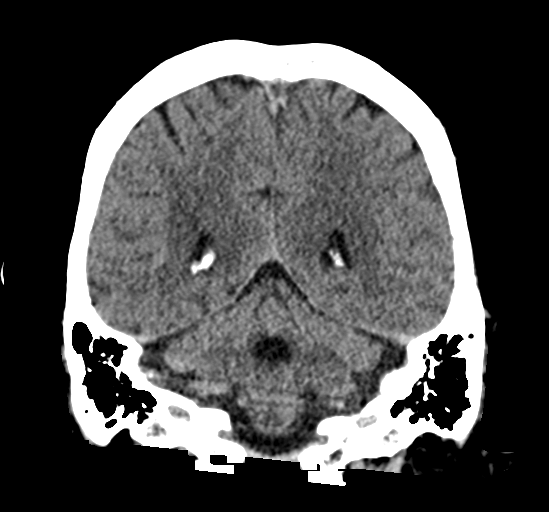
[im 30/67  brain]
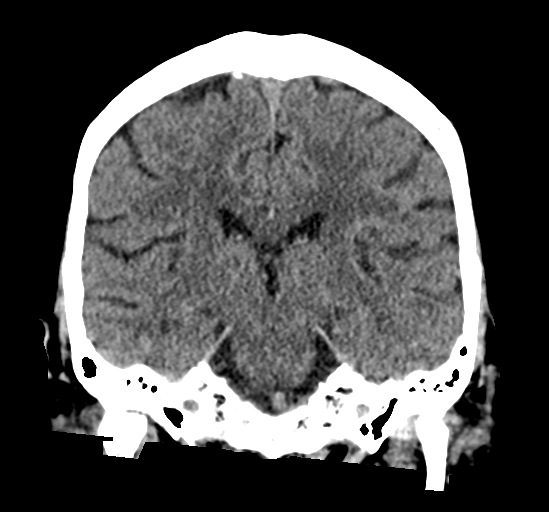
[im 37/67  brain]
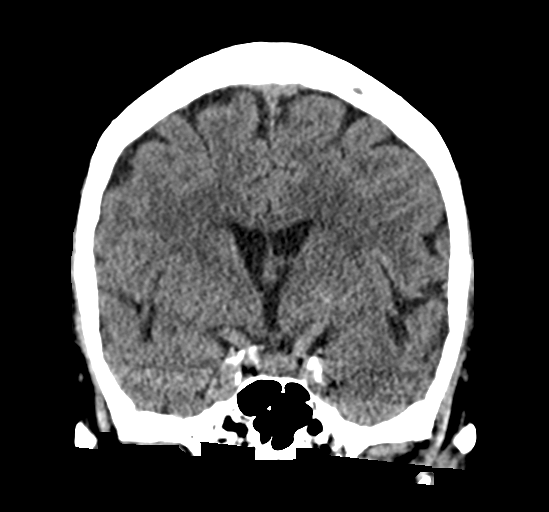

[Series 5: sagittal soft tissue · sagittal · 0.33mm/px · 3 of 51 slices shown]
[im 17/51  brain]
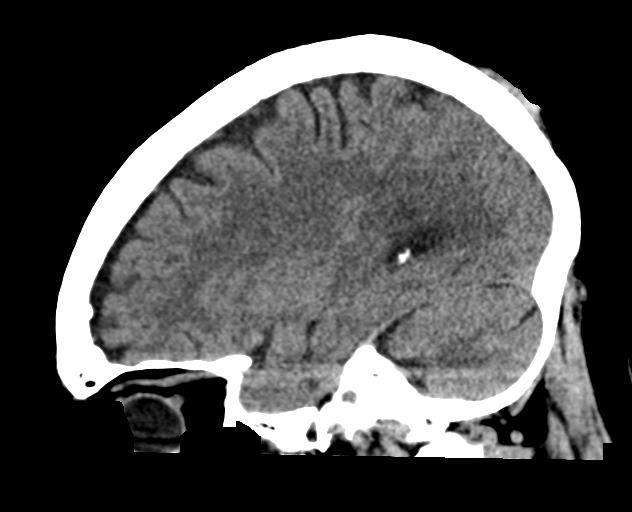
[im 26/51  brain]
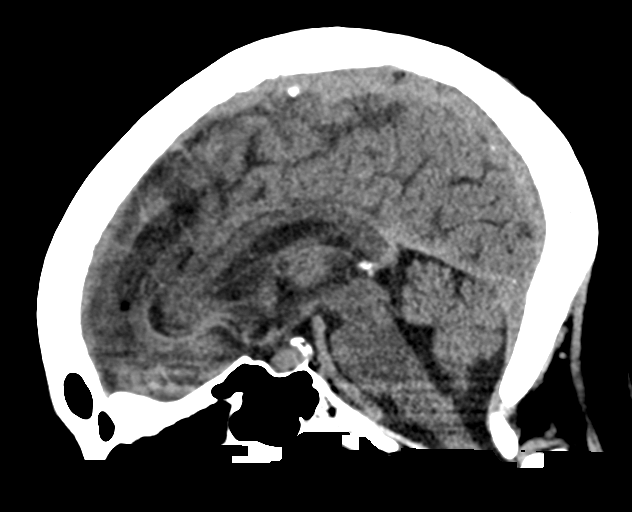
[im 34/51  brain]
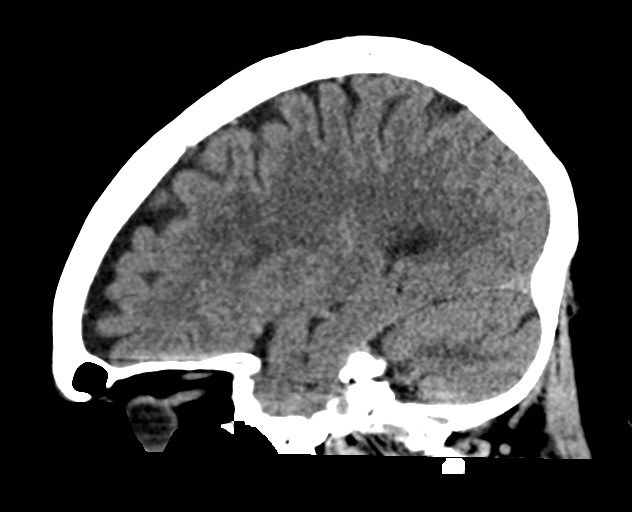

[16 of 47 positions shown; findings below may reference images not displayed]

FINDINGS: CT HEAD FINDINGS

Brain: No evidence of acute infarction, hemorrhage, hydrocephalus,
extra-axial collection or mass lesion/mass effect. Mild atrophic
changes are noted.

Vascular: No hyperdense vessel or unexpected calcification.

Skull: Normal. Negative for fracture or focal lesion.

Sinuses/Orbits: No acute finding.

Other: None.

CT CERVICAL SPINE FINDINGS

Alignment: Mild loss of normal cervical lordosis. Degenerative
anterolisthesis of C2 on C3 is noted.

Skull base and vertebrae: 7 cervical segments are well visualized.
Vertebral body height is well maintained. No acute fracture or acute
facet abnormality is noted. Degenerative anterolisthesis of C2 on C3
is noted. The odontoid is within normal limits.

Soft tissues and spinal canal: Surrounding soft tissue structures
are unremarkable.

Upper chest: Visualized lung apices are within normal limits.

Other: None
IMPRESSION: CT of the head: Mild atrophic changes without acute abnormality.

CT of the cervical spine: Degenerative changes with anterolisthesis
of C3 on C4. No acute abnormality noted.

## 2023-03-16 ENCOUNTER — Other Ambulatory Visit: Payer: Self-pay

## 2023-03-16 DIAGNOSIS — Z1231 Encounter for screening mammogram for malignant neoplasm of breast: Secondary | ICD-10-CM

## 2023-03-16 NOTE — Progress Notes (Signed)
 ENCOUNTER: Patient Class :No patient class for patient encounter Department: [facility] [address] [place] [address]  PATIENT: Patient Demographics      Name Patient ID SSN Gender Identity Birth Date   Sherry Owens, Sherry Owens [id number] Female 05/02/2041 (82 yrs)          Address Phone Email       [address] [place] [address] [phone]            County Race         [place] or [place] American             Reg Status PCP Date Last Verified Next Review Date     Verified Marikay Eva Hausen [id number] 03/10/23 04/09/23           Marital Status Religion Language       Single Unknown-Patient Declined English              EMERGENCY CONTACT: Name Relationship Lgl Grd Work Marine scientist Phone  1. BRINDA MILL Son or Daughter   [phone]   2. BRINDA GLATTER Son or Daughter   [phone]     GUARANTOR: There is no guarantor information entered for this encounter.  COVERAGE: Primary Visit Coverage      Payer Plan Group Number Group Name Payer Phone Plan Phone   No coverage found                Secondary Visit Coverage      Payer Plan Group Number Group Name Payer Phone Plan Phone   No coverage found                Primary Coverage      Payer Plan Group Number Group Name Payer Phone Plan Phone   [facility] MEDICARE ADVANTAGE PLAN [facility] DUAL COMPLETE HMO POS SNP [phone]              Primary Subscriber      Subscriber ID Subscriber Name Subscriber [facility] Subscriber Address   [id number] [facility] F [id number] [address]      [place], [place] [address]           Secondary Coverage      Payer Plan Group Number Group Name Payer Phone Plan Phone   MEDICAID [facility] MEDICAID [place] ACCESS    [phone]           Secondary Subscriber      Subscriber ID Subscriber Name Subscriber [facility] Subscriber Address   [id number] [facility] M  [id number] [address]      [place], [place] [address]

## 2023-03-16 NOTE — Progress Notes (Signed)
 QUICK REFERENCE INFORMATION: CMS.gov Medicare Wellness Visits  Medicare Annual Wellness Visit  Chief Complaint  Patient presents with  . Annual Exam    Physical and AWV  . Cough    Cough, congestion and sneezing x 1 month No fever or ST    Subjective:   Sherry Owens is a 82 y.o. Female who presents for an Annual Wellness Visit. Additional concerns addressed today include: HPI Hypertension- Blood pressure has been variable. She has not been taking her medication. Has some headache and dizziness. No unilateral weakness. Is supposed to be taking Cardizem  CD 180 mg daily, hydralazine  25 mg 3 times daily, losartan  50 mg daily, isosorbide  30 mg ER tablet. She did not bring her pills with her today. Is unclear what she is actually taking. She has not been seen in the office since November 2022 for an acute visit. She is also followed by cardiology but has not been seen by them since November 2022. She has been staying in her home.  Paroxysmal Atrial Fibrillation- has not seen cardiology in a while. On diltiazem  for rate control and Apixaban . Chol controlled 175 LDL 76 HDL 74 Depression- taking Lexapro  10mg  qd and this helps.  Notes ST and headache- some nasal drainage.    Patient Active Problem List  Diagnosis  . Anxiety  . Depression  . Chronic recurrent major depressive disorder (CMS-HCC)  . Essential hypertension  . Atypical chest pain  . History of osteoporosis  . Primary osteoarthritis involving multiple joints  . Genital herpes simplex  . Chronic fatigue  . Paroxysmal SVT (supraventricular tachycardia)  . SOB (shortness of breath) on exertion  . Paroxysmal atrial fibrillation (CMS/HHS-HCC)     Current Outpatient Medications:  .  cyanocobalamin  (VITAMIN B12) 1000 MCG tablet, Take 1,000 mcg by mouth once daily., Disp: , Rfl:  .  diltiazem  (CARDIZEM  CD) 180 MG CD capsule, Take 1 capsule (180 mg total) by mouth once daily, Disp: 30 capsule, Rfl: 11 .  ELIQUIS  5 mg tablet, Take  1 tablet (5 mg total) by mouth every 12 (twelve) hours, Disp: 60 tablet, Rfl: 5 .  ergocalciferol, vitamin D2, 1,250 mcg (50,000 unit) capsule, Take 1 capsule (50,000 Units total) by mouth once a week, Disp: 8 capsule, Rfl: 2 .  escitalopram  oxalate (LEXAPRO ) 10 MG tablet, TAKE 1 TABLET (10 MG) BY MOUTH EVERY DAY, Disp: 90 tablet, Rfl: 1 .  fluticasone propionate (FLONASE) 50 mcg/actuation nasal spray, Place 2 sprays into both nostrils once daily, Disp: 16 g, Rfl: 2 .  hydrALAZINE  (APRESOLINE ) 25 MG tablet, Take 25 mg by mouth 3 (three) times daily, Disp: , Rfl:  .  losartan  (COZAAR ) 50 MG tablet, TAKE 1 TABLET BY MOUTH EVERY DAY, Disp: 90 tablet, Rfl: 1 .  omeprazole (PRILOSEC) 20 MG DR capsule, TAKE 1 CAPSULE BY MOUTH EVERY DAY, Disp: 90 capsule, Rfl: 1 .  valACYclovir  (VALTREX ) 500 MG tablet, Take 1 tablet (500 mg total) by mouth once daily, Disp: 90 tablet, Rfl: 1   Social Determinants of Health with Concerns   Housing Stability: Unknown (03/16/2023)   Housing Stability Vital Sign   . Unable to Pay for Housing in the Last Year: No   . Unstable Housing in the Last Year: No      03/16/2023  [employer] Authorization for Release of Information - [employer]   Are any of your needs urgent? No  Would you like help with any of the needs that you have identified? No    Family History  Problem Relation Age of Onset  . Pancreatic cancer Mother   . Colon cancer Father   . Asthma Sister   . Pancreatic cancer Brother   . Asthma Sister   . COPD Sister   . High blood pressure (Hypertension) Brother      Past Medical History:  Diagnosis Date  . Anxiety   . Depression   . Hypertension      Current Medical Providers and Suppliers: [facility] Patient Care Team: Marikay Eva Hausen, PA as PCP - General (Internal Medicine) [place] GL[facility]WO[facility] NIPPER as PCP - [place] [facility] Future Appointments     Date/Time Provider Department Center Visit Type   09/15/2023 1:15 PM Marikay Eva Hausen, PA [facility]  MARYL BROCKS [facility] OFFICE VISIT      none  Age-appropriate Screening Schedule: The list below includes current immunization status and future screening recommendations based on patient's age. Orders for these recommended tests are listed in the plan section. The patient has been provided with a written plan. Immunization History  Administered Date(s) Administered  . COVID-19 Moderna Vaccine (1st,2nd,3rd dose = 0.60ml) 07/24/2020  . Influenza IIV3, IM High Dose 08/15/2017  . Influenza, IM unspecified 09/06/2016  . PNEUMOCOCCAL (PCV13) (BIRTH-41YR) VACCINE (PREVNAR 13) 01/01/2017  . Pneumococcal (PCV20) (>=6WKS) vaccine (Prevnar 20) (aka PCV 20) 03/16/2023  . TDAP (>=62YR) VACCINE (ADACEL/BOOSTRIX) 09/23/2021  . Varicella zoster (Zostavax) 01/01/2017    Health Maintenance Topics with due status: Overdue     Topic Date Due   Mammogram 12/17/2019   DXA Bone Density Scan 01/20/2023   Health Maintenance Topics with due status: Due Soon     Topic Date Due   Colonoscopy 05/05/2023   Health Maintenance Topics with due status: Postponed     Topic Postponed Until   COVID-19 Vaccine 03/15/2024 (Originally 08/07/2022)   Shingrix 03/15/2024 (Originally 02/26/2017)   Health Maintenance Topics with due status: Not Due     Topic Last Completion Date   Influenza Vaccine 08/15/2017   Adult Tetanus (Td And Tdap) 09/23/2021   Potassium Level 03/11/2023   Lipid Panel 03/11/2023   Serum Calcium 03/11/2023   Diabetes Screening 03/11/2023   Depression Screening 03/16/2023   Medicare Initial or AWV 03/16/2023   Health Maintenance Topics with due status: Completed     Topic Last Completion Date   Pneumococcal Vaccine: 65+ 03/16/2023   Health Maintenance Topics with due status: Aged Out     Topic Date Due   Hib Vaccines Aged Out   Hepatitis A Vaccines Aged Out   Meningococcal ACWY Vaccine Aged Out   HPV Vaccines Aged Out    Depression Screen-PHQ2/9 completed today  PHQ-2   PHQ-2 Over  the last 2 weeks, how often have you been bothered by any of the following problems? Little interest or pleasure in doing things: Not at all Feeling down, depressed, or hopeless: Several days Patient Health Questionnaire-2 Score: 0  PHQ-9 (if PHQ >=3)    PHQ-2 Interpretation Values between 0-3 are considered not significant for depression  PHQ-9 Interpretation and Treatment Recommendations:  0-4= None  5-9= Mild / Treatment: Support, educate to call if worse; return in one month  10-14= Moderate / Treatment: Support, watchful waiting; Antidepressant or Psychotherapy  15-19= Moderately severe / Treatment: Antidepressant OR Psychotherapy  >= 20 = Major depression, severe / Antidepressant AND Psychotherapy  Patient Health Risk Assessment questionnaire (HRA <redacted file path>): (if patient completed in MyChart or added in flowsheet)    * No data to display  Recent Hospitalizations? No, 2022  Functional Ability/Safety Screen: 1. Was the patient's timed Get Up and Go Test unsteady or longer than 30 sec? No  How to perform Timed Up and Go test (TUG): [email or web address].[email or web address].[email or web address]  2. Does the patient have difficulty with: Shaune index)  Bathing: No   Dressing: No Toileting: No   Transferring: No  Continence: No  Feeding: No   3. Does the home have: lives alone. Does not drive. Uses a cane on occasion. Rugs in the hallway: No Grab bars in the bathroom: Yes Stairs in home: No Handrails on the stairs: no Poor lighting: No  Hearing Evaluation: Does the patient have trouble hearing the television when others do not? No Does the patient strain to hear/understand conversations? No  Screening for drug use: How many times in the past year has patient used an illegal drug or used a prescription medication for non-medical reasons? 0 (>=1 is positive) Has the patient used opioid medication within the last year? No  Social History   Tobacco Use  .  Smoking status: Never  . Smokeless tobacco: Never  Vaping Use  . Vaping status: Never Used  Substance Use Topics  . Alcohol use: No  . Drug use: No    Advance Care Planning: Patient has executed an Advance Directive: yes  If no, patient was given the opportunity to execute an Advance Directive today? n/a  Are the patient's advanced directives in [place]? yes This patient has the ability to prepare an Advance Directive: Yes Provider is willing to follow the patient's wishes: Yes  Cognitive Assessment: Cognitive screen used: Mini cog. Results normal Results: The patient does not have any evidence of any cognitive problems and denies any change in mood/affect, appearance, speech, memory or motor skills.  Identification of Risk Factors: Risk factors include: weight , inactivity, and increased fall risk  Review of Systems  Objective:   Vitals:   03/16/23 1113  BP: 108/80  Pulse: 79  SpO2: 95%  Weight: 83 kg (183 lb)  Height: 165.1 cm (5' 5)  PainSc: 0-No pain   Body mass index is 30.45 kg/m. Home vitals:    Physical Exam Wt Readings from Last 3 Encounters:  03/16/23 83 kg (183 lb)  02/04/23 82.9 kg (182 lb 12.8 oz)  10/16/21 81.2 kg (179 lb)   Constitutional: alert and in NAD Conversation: normal and appropriate HEENT: EOMI, external ear canals normal, tympanic membranes normal, and pupils equal and round strabismus Skin: normal Oropharynx: good dentition, mucous membranes moist, posterior pharynx clear Neck: supple, no thyroid  enlargement or cervical adenopathy, no carotid bruits, and no JVD Respiratory: clear to auscultation, without rales or wheezes Cardiovascular: regular rate and rhythm and without murmurs, rubs or gallops Abdomen: soft, nontender, nondistended, and no hepatosplenomegaly Musculoskeletal: age appropriate ROM of shoulders, hips and knees Extremities: no lower extremity edema, dorsalis pedis pulses normal Neurological: grossly intact and normal  gait  Appointment on 03/11/2023  Component Date Value Ref Range Status  . WBC (White Blood Cell Count) 03/11/2023 4.7  4.1 - 10.2 10^3/uL Final  . RBC (Red Blood Cell Count) 03/11/2023 4.03 (L)  4.04 - 5.48 10^6/uL Final  . Hemoglobin 03/11/2023 13.1  12.0 - 15.0 gm/dL Final  . Hematocrit 95/95/7975 39.8  35.0 - 47.0 % Final  . MCV (Mean Corpuscular Volume) 03/11/2023 98.8  80.0 - 100.0 fl Final  . MCH (Mean Corpuscular Hemoglobin) 03/11/2023 32.5 (H)  27.0 - 31.2 pg Final  . MCHC (Mean Corpuscular Hemoglobin * 03/11/2023 32.9  32.0 - 36.0 gm/dL Final  . Platelet Count 03/11/2023 150  150 - 450 10^3/uL Final  . RDW-CV (Red Cell Distribution Widt* 03/11/2023 13.0  11.6 - 14.8 % Final  . Neutrophils 03/11/2023 2.76  1.50 - 7.80 10^3/uL Final  . Lymphocytes 03/11/2023 1.36  1.00 - 3.60 10^3/uL Final  . Monocytes 03/11/2023 0.46  0.00 - 1.50 10^3/uL Final  . Eosinophils 03/11/2023 0.07  0.00 - 0.55 10^3/uL Final  . Basophils 03/11/2023 0.03  0.00 - 0.09 10^3/uL Final  . Neutrophil % 03/11/2023 59.0  32.0 - 70.0 % Final  . Lymphocyte % 03/11/2023 29.1  10.0 - 50.0 % Final  . Monocyte % 03/11/2023 9.8  4.0 - 13.0 % Final  . Eosinophil % 03/11/2023 1.5  1.0 - 5.0 % Final  . Basophil% 03/11/2023 0.6  0.0 - 2.0 % Final  . Immature Granulocyte % 03/11/2023 0.0  <=0.7 % Final  . Immature Granulocyte Count 03/11/2023 0.00  <=0.06 10^3/L Final  . Glucose 03/11/2023 106  70 - 110 mg/dL Final  . Sodium 95/95/7975 140  136 - 145 mmol/L Final  . Potassium 03/11/2023 4.1  3.6 - 5.1 mmol/L Final  . Chloride 03/11/2023 105  97 - 109 mmol/L Final  . Carbon Dioxide (CO2) 03/11/2023 29.1  22.0 - 32.0 mmol/L Final  . Urea Nitrogen (BUN) 03/11/2023 29 (H)  7 - 25 mg/dL Final  . Creatinine 95/95/7975 1.1  0.6 - 1.1 mg/dL Final  . Glomerular Filtration Rate (eGFR) 03/11/2023 50 (L)  >60 mL/min/1.73sq m Final  . Calcium 03/11/2023 9.4  8.7 - 10.3 mg/dL Final  . AST  95/95/7975 21  8 - 39 U/L Final  . ALT   03/11/2023 13  5 - 38 U/L Final  . Alk Phos (alkaline Phosphatase) 03/11/2023 82  34 - 104 U/L Final  . Albumin 03/11/2023 3.8  3.5 - 4.8 g/dL Final  . Bilirubin, Total 03/11/2023 0.7  0.3 - 1.2 mg/dL Final  . Protein, Total 03/11/2023 6.9  6.1 - 7.9 g/dL Final  . A/G Ratio 95/95/7975 1.2  1.0 - 5.0 gm/dL Final  . Cholesterol, Total 03/11/2023 175  100 - 200 mg/dL Final  . Triglyceride 95/95/7975 127  35 - 199 mg/dL Final  . HDL (High Density Lipoprotein) Cho* 03/11/2023 73.7  35.0 - 85.0 mg/dL Final  . LDL Calculated 03/11/2023 76  0 - 130 mg/dL Final  . VLDL Cholesterol 03/11/2023 25  mg/dL Final  . Cholesterol/HDL Ratio 03/11/2023 2.4   Final  . Color 03/11/2023 Light Yellow  Colorless, Straw, Light Yellow, Yellow, Dark Yellow Final  . Clarity 03/11/2023 Clear  Clear Final  . Specific Gravity 03/11/2023 1.016  1.005 - 1.030 Final  . pH, Urine 03/11/2023 6.0  5.0 - 8.0 Final  . Protein, Urinalysis 03/11/2023 Trace (!)  Negative mg/dL Final  . Glucose, Urinalysis 03/11/2023 Negative  Negative mg/dL Final  . Ketones, Urinalysis 03/11/2023 Negative  Negative mg/dL Final  . Blood, Urinalysis 03/11/2023 Negative  Negative Final  . Nitrite, Urinalysis 03/11/2023 Negative  Negative Final  . Leukocyte Esterase, Urinalysis 03/11/2023 1+ (!)  Negative Final  . Bilirubin, Urinalysis 03/11/2023 Negative  Negative Final  . Urobilinogen, Urinalysis 03/11/2023 0.2  0.2 - 1.0 mg/dL Final  . WBC, UA 95/95/7975 6 (H)  <=5 /hpf Final  . Red Blood Cells, Urinalysis 03/11/2023 1  <=3 /hpf Final  . Bacteria, Urinalysis 03/11/2023 0-5  0 - 5 /hpf Final  . Squamous Epithelial Cells, Urinaly* 03/11/2023 0  /hpf  Final  . Hemoglobin A1C 03/11/2023 5.3  4.2 - 5.6 % Final  . Average Blood Glucose (Calc) 03/11/2023 105  mg/dL Final  . Vitamin B12 95/95/7975 352  >300 pg/mL Final  . Vitamin D, 25-Hydroxy - [facility] 03/11/2023 17.7 (L)  30.0 - 100.0 ng/mL Final  . Thyroid  Stimulating Hormone (TSH) 03/11/2023 1.277   0.450-5.330 uIU/ml uIU/mL Final   Assessment/Plan:   Patient Self-Management and Personalized Health Advice The patient has been provided with information about: diet, exercise, weight management, fall prevention, and designing advance directives  During the course of the visit the patient was educated and counseled about appropriate screening and preventive services including:  COVID vaccine(s) Influenza vaccine Pneumococcal conjugate vaccine 20 (PCV20) Shingrix vaccine (Herpes Zoster)-advised to get at pharmacy due to coverage TdaP vaccine-advised to get at pharmacy due to coverage Bone density screening Breast cancer screening Cardiovascular risk screening Advanced directives: has an advanced directive - a copy has been provided and is in file  The patient's BMI is above the acceptable range; discussed or provided materials on diet/exercise  Diagnoses and all orders for this visit:  Annual physical exam  Medicare annual wellness visit, subsequent  Hypertension, essential  Paroxysmal atrial fibrillation (CMS/HHS-HCC)  Post-menopausal -     DXA bone density; Future  Recurrent major depressive disorder, in partial remission (CMS-HCC)  B12 deficiency  Vitamin D deficiency -     Discontinue: ergocalciferol, vitamin D2, 1,250 mcg (50,000 unit) capsule; Take 1 capsule (50,000 Units total) by mouth once a week -     ergocalciferol, vitamin D2, 1,250 mcg (50,000 unit) capsule; Take 1 capsule (50,000 Units total) by mouth once a week  Encounter for screening mammogram for malignant neoplasm of breast -     Mammo screening digital bilateral; Future  Need for vaccination -     Pneumococcal (PCV20) (>=6WKS) vaccine (Prevnar 20) (aka PCV 20)  Numerous moles -     Ambulatory Referral to Dermatology  Mid back pain -     X-ray thoracic spine 3 views; Future  Other orders -     Follow up in Primary Care -     ELIQUIS  5 mg tablet; Take 1 tablet (5 mg total) by mouth every 12  (twelve) hours -     fluticasone propionate (FLONASE) 50 mcg/actuation nasal spray; Place 2 sprays into both nostrils once daily -     valACYclovir  (VALTREX ) 500 MG tablet; Take 1 tablet (500 mg total) by mouth once daily -     Follow up in Primary Care; Future    Plan:   Medicare wellness visit completed . PE performed. Health maintenance reviewed and updated. Reviewed labs. Continue follow up with specialists as need.   This visit was coded based on medical decision making (MDM).       Follow-up     Normal Orders This Visit   Follow up in Primary Care [[id number] Custom]    Questions:   Does this order need to be coordinated with another visit at scheduling? The patient will need to schedule this at the front desk before they leave.: No   Who is this follow-up with?: Me   Can this appointment be overbooked by a scheduler?: No   What type of follow up is needed?: Physical   What's the reason for follow up?: PE wtih labs    Future Labs/Procedures Expected by Expires   Follow up in Primary Care [[id number] Custom]  09/15/2023 11/16/2023   Questions:     Does  this order need to be coordinated with another visit at scheduling? The patient will need to schedule this at the front desk before they leave.: No   Who is this follow-up with?: Me   Can this appointment be overbooked by a scheduler?: No   What type of follow up is needed?: Office Visit   What's the reason for follow up?: Chronic Medical Problems   Allow telemedicine?: In Person Only         Future Appointments     Date/Time Provider Department Center Visit Type   09/15/2023 1:15 PM Marikay Eva Hausen, PA [facility] OFFICE VISIT       An after visit summary was provided for the patient either in written format or through [facility], PA-C   *Some images could not be shown.

## 2023-06-18 ENCOUNTER — Other Ambulatory Visit: Payer: Self-pay

## 2023-06-18 DIAGNOSIS — R531 Weakness: Secondary | ICD-10-CM | POA: Diagnosis present

## 2023-06-18 DIAGNOSIS — Z1152 Encounter for screening for COVID-19: Secondary | ICD-10-CM | POA: Insufficient documentation

## 2023-06-18 DIAGNOSIS — E860 Dehydration: Secondary | ICD-10-CM | POA: Insufficient documentation

## 2023-06-18 DIAGNOSIS — R109 Unspecified abdominal pain: Secondary | ICD-10-CM | POA: Insufficient documentation

## 2023-06-18 NOTE — ED Triage Notes (Signed)
Pt to ed from home via acems for weakness and congestion x 3 weeks, as well as constipation x 3 days. Took some mirilax with some relief but not much.  183/108 67 HR 96 % on RA 110BGL  98.4T  Pt states "I have been sick for the last couple of weeks, maybe I have covid or a UTI or constipation oh and I'm nauseated too". Pt is caox4, in no acute distress in triage.

## 2023-06-19 ENCOUNTER — Emergency Department: Payer: 59

## 2023-06-19 ENCOUNTER — Emergency Department
Admission: EM | Admit: 2023-06-19 | Discharge: 2023-06-19 | Disposition: A | Discharge status: Home / Self Care | Payer: 59 | Attending: Emergency Medicine | Admitting: Emergency Medicine

## 2023-06-19 DIAGNOSIS — E860 Dehydration: Secondary | ICD-10-CM | POA: Diagnosis not present

## 2023-06-19 LAB — LIPASE, BLOOD: Lipase: 43 U/L (ref 11–51)

## 2023-06-19 LAB — COMPREHENSIVE METABOLIC PANEL
ALT: 18 U/L (ref 0–44)
AST: 28 U/L (ref 15–41)
Albumin: 4.3 g/dL (ref 3.5–5.0)
Alkaline Phosphatase: 84 U/L (ref 38–126)
Anion gap: 9 (ref 5–15)
BUN: 22 mg/dL (ref 8–23)
CO2: 22 mmol/L (ref 22–32)
Calcium: 9.7 mg/dL (ref 8.9–10.3)
Chloride: 104 mmol/L (ref 98–111)
Creatinine, Ser: 1.32 mg/dL — ABNORMAL HIGH (ref 0.44–1.00)
GFR, Estimated: 40 mL/min — ABNORMAL LOW (ref >60)
Glucose, Bld: 98 mg/dL (ref 70–99)
Potassium: 3.9 mmol/L (ref 3.5–5.1)
Sodium: 135 mmol/L (ref 135–145)
Total Bilirubin: 1.4 mg/dL — ABNORMAL HIGH (ref 0.3–1.2)
Total Protein: 8.1 g/dL (ref 6.5–8.1)

## 2023-06-19 LAB — CBC
HCT: 41.5 % (ref 36.0–46.0)
Hemoglobin: 14.2 g/dL (ref 12.0–15.0)
MCH: 32.9 pg (ref 26.0–34.0)
MCHC: 34.2 g/dL (ref 30.0–36.0)
MCV: 96.3 fL (ref 80.0–100.0)
Platelets: 165 10*3/uL (ref 150–400)
RBC: 4.31 MIL/uL (ref 3.87–5.11)
RDW: 12.5 % (ref 11.5–15.5)
WBC: 5.5 10*3/uL (ref 4.0–10.5)
nRBC: 0 % (ref 0.0–0.2)

## 2023-06-19 LAB — URINALYSIS, ROUTINE W REFLEX MICROSCOPIC
Bilirubin Urine: NEGATIVE
Glucose, UA: NEGATIVE mg/dL
Hgb urine dipstick: NEGATIVE
Ketones, ur: NEGATIVE mg/dL
Leukocytes,Ua: NEGATIVE
Nitrite: NEGATIVE
Protein, ur: NEGATIVE mg/dL
Specific Gravity, Urine: 1.013 (ref 1.005–1.030)
pH: 8 (ref 5.0–8.0)

## 2023-06-19 LAB — SARS CORONAVIRUS 2 BY RT PCR: SARS Coronavirus 2 by RT PCR: NEGATIVE

## 2023-06-19 LAB — TROPONIN I (HIGH SENSITIVITY): Troponin I (High Sensitivity): 7 ng/L (ref <18)

## 2023-06-19 MED ORDER — IOHEXOL 300 MG/ML  SOLN
75.0000 mL | Freq: Once | INTRAMUSCULAR | Status: AC | PRN
  Administered 2023-06-19: 75 mL via INTRAVENOUS

## 2023-06-19 MED ORDER — SODIUM CHLORIDE 0.9 % IV BOLUS
500.0000 mL | Freq: Once | INTRAVENOUS | Status: AC
Start: 2023-06-19 — End: 2023-06-19
  Administered 2023-06-19: 500 mL via INTRAVENOUS

## 2023-06-19 NOTE — ED Provider Notes (Signed)
[facility] Provider Note    Event Date/Time   First MD Initiated Contact with Patient 06/19/23 0300     (approximate)   History   Abdominal Pain   HPI  Sherry Owens is a 82 y.o. female who presents to the emergency department with myriad medical complaints.  She does discuss that what brought her in today was abdominal pain and a feeling of weakness.  It was severe enough that she felt she would not be able to get up.  At the time my exam had improved.  Located primarily in the upper abdomen.  Additionally the patient has felt congested for the past 3 weeks.  Had some ringing in her right ear.  Also concerned that she might have a urinary tract infection given she has had some bad odor to her urine.  She denies any fevers.  No known sick contacts.     Physical Exam   Triage Vital Signs: ED Triage Vitals  Encounter Vitals Group     BP 06/18/23 2356 (!) 190/98     Systolic BP Percentile --      Diastolic BP Percentile --      Pulse Rate 06/18/23 2356 63     Resp 06/18/23 2356 16     Temp 06/18/23 2356 98 F (36.7 C)     Temp Source 06/18/23 2356 Oral     SpO2 06/18/23 2356 98 %     Weight 06/18/23 2354 180 lb 12.4 oz (82 kg)     Height 06/18/23 2354 5\' 5"  (1.651 m)     Head Circumference --      Peak Flow --      Pain Score 06/18/23 2354 5     Pain Loc --      Pain Education --      Exclude from Growth Chart --     Most recent vital signs: Vitals:   06/18/23 2356  BP: (!) 190/98  Pulse: 63  Resp: 16  Temp: 98 F (36.7 C)  SpO2: 98%   General: Awake, alert, oriented. CV:  Good peripheral perfusion. Regular rate and rhythm. Resp:  Normal effort. Lungs clear. Abd:  No distention. Tender to palpation in the upper abdomen.   ED Results / Procedures / Treatments   Labs (all labs ordered are listed, but only abnormal results are displayed) Labs Reviewed  COMPREHENSIVE METABOLIC PANEL - Abnormal; Notable for the following  components:      Result Value   Creatinine, Ser 1.32 (*)    Total Bilirubin 1.4 (*)    GFR, Estimated 40 (*)    All other components within normal limits  LIPASE, BLOOD  CBC  URINALYSIS, ROUTINE W REFLEX MICROSCOPIC  TROPONIN I (HIGH SENSITIVITY)     EKG  I, Phineas Semen, attending physician, personally viewed and interpreted this EKG  EKG Time: 0003 Rate: 61 Rhythm: normal sinus rhythm Axis: left axis deviation Intervals: qtc 394 QRS: narrow, q waves v1, LVH ST changes: no st elevation Impression: abnormal ekg   RADIOLOGY I independently interpreted and visualized the acute abd series. My interpretation: No pneumonia. Radiology interpretation:  IMPRESSION:  Negative abdominal radiographs.    Unchanged left basilar consolidation, favor atelectasis.   I independently interpreted and visualized the CT abd/pel. My interpretation: No free air. No distended bowel. Radiology interpretation:  IMPRESSION:  1. 1.8 x 1.7 cm focal opacity in the lingular base adjacent the  heart apex, suspected to be atypical focal atelectasis as  there was  a similar but smaller opacity in this location on a chest CT dated  01/02/2009. This may have been present in 2021 but there was no  aerated lung around it. This is a likely benign finding, but given  size and cancer history would recommend PET-CT for further study.  2. Cardiomegaly with three-vessel calcific CAD.  3. Small hiatal hernia.  4. Elongate mildly distended stomach. Query chronic impaired gastric  emptying.  5. Constipation and diverticulosis.  6. Mild bilateral hydroureteronephrosis probably due to bladder  distention. No urinary stones.  7. Osteopenia and degenerative change with chronic appearing  compression fractures of T7-9 and L2, the L2 fracture new since  2021.  8. Mild stranding change in the omentum is no greater than  previously and probably related to the prior surgery such as omental  fat necrosis. 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  9. Aortic atherosclerosis.    Aortic Atherosclerosis (ICD10-I70.0).     PROCEDURES:  Critical Care performed: No   MEDICATIONS ORDERED IN ED: Medications - No data to display   IMPRESSION / MDM / ASSESSMENT AND PLAN / ED COURSE  I reviewed the triage vital signs and the nursing notes.                              Differential diagnosis includes, but is not limited to, viral illness, pneumonia, UTI, diverticulitis  Patient's presentation is most consistent with acute presentation with potential threat to life or bodily function.   The patient is on the cardiac monitor to evaluate for evidence of arrhythmia and/or significant heart rate changes.  Patient presented to the emergency department today with myriad medical complaints.  Primary complaint of abdominal pain and weakness.  Patient's blood work without any concerning leukocytosis.  She did have mild tenderness to palpation of the upper abdomen.  Urine without findings concerning for infection.  I did obtain a CT scan to evaluate for possible intra-abdominal infection or obstruction.  This did not show any concerning acute abdominal findings.  Did show concern for possible lung mass.  I discussed this finding with the patient.  Discussed importance of close follow-up with primary care. She did feel better after IV fluids. Do think weakness likely related to dehydration.      FINAL CLINICAL IMPRESSION(S) / ED DIAGNOSES   Final diagnoses:  Dehydration     Note:  This document was prepared using Dragon voice recognition software and may include unintentional dictation errors.    Phineas Semen, MD 06/19/23 [phone]

## 2023-06-19 NOTE — Discharge Instructions (Signed)
Please seek medical attention for any high fevers, chest pain, shortness of breath, change in behavior, persistent vomiting, bloody stool or any other new or concerning symptoms.  

## 2023-07-15 ENCOUNTER — Other Ambulatory Visit: Payer: Self-pay

## 2023-07-15 ENCOUNTER — Emergency Department
Admission: EM | Admit: 2023-07-15 | Discharge: 2023-07-15 | Disposition: A | Discharge status: Home / Self Care | Payer: 59 | Attending: Emergency Medicine | Admitting: Emergency Medicine

## 2023-07-15 ENCOUNTER — Emergency Department: Payer: 59

## 2023-07-15 DIAGNOSIS — Z1152 Encounter for screening for COVID-19: Secondary | ICD-10-CM | POA: Diagnosis not present

## 2023-07-15 DIAGNOSIS — I10 Essential (primary) hypertension: Secondary | ICD-10-CM | POA: Diagnosis not present

## 2023-07-15 DIAGNOSIS — R519 Headache, unspecified: Secondary | ICD-10-CM | POA: Diagnosis present

## 2023-07-15 DIAGNOSIS — Z7901 Long term (current) use of anticoagulants: Secondary | ICD-10-CM | POA: Diagnosis not present

## 2023-07-15 DIAGNOSIS — G43909 Migraine, unspecified, not intractable, without status migrainosus: Secondary | ICD-10-CM | POA: Diagnosis not present

## 2023-07-15 DIAGNOSIS — R509 Fever, unspecified: Secondary | ICD-10-CM | POA: Diagnosis not present

## 2023-07-15 LAB — BASIC METABOLIC PANEL
Anion gap: 10 (ref 5–15)
BUN: 25 mg/dL — ABNORMAL HIGH (ref 8–23)
CO2: 21 mmol/L — ABNORMAL LOW (ref 22–32)
Calcium: 8.8 mg/dL — ABNORMAL LOW (ref 8.9–10.3)
Chloride: 101 mmol/L (ref 98–111)
Creatinine, Ser: 1.36 mg/dL — ABNORMAL HIGH (ref 0.44–1.00)
GFR, Estimated: 39 mL/min — ABNORMAL LOW (ref >60)
Glucose, Bld: 100 mg/dL — ABNORMAL HIGH (ref 70–99)
Potassium: 4.1 mmol/L (ref 3.5–5.1)
Sodium: 132 mmol/L — ABNORMAL LOW (ref 135–145)

## 2023-07-15 LAB — CBC WITH DIFFERENTIAL/PLATELET
Abs Immature Granulocytes: 0.04 10*3/uL (ref 0.00–0.07)
Basophils Absolute: 0.1 10*3/uL (ref 0.0–0.1)
Basophils Relative: 1 %
Eosinophils Absolute: 0 10*3/uL (ref 0.0–0.5)
Eosinophils Relative: 0 %
HCT: 38.5 % (ref 36.0–46.0)
Hemoglobin: 12.9 g/dL (ref 12.0–15.0)
Immature Granulocytes: 1 %
Lymphocytes Relative: 26 %
Lymphs Abs: 2.3 10*3/uL (ref 0.7–4.0)
MCH: 32.3 pg (ref 26.0–34.0)
MCHC: 33.5 g/dL (ref 30.0–36.0)
MCV: 96.5 fL (ref 80.0–100.0)
Monocytes Absolute: 0.9 10*3/uL (ref 0.1–1.0)
Monocytes Relative: 10 %
Neutro Abs: 5.4 10*3/uL (ref 1.7–7.7)
Neutrophils Relative %: 62 %
Platelets: 128 10*3/uL — ABNORMAL LOW (ref 150–400)
RBC: 3.99 MIL/uL (ref 3.87–5.11)
RDW: 12.6 % (ref 11.5–15.5)
WBC: 8.6 10*3/uL (ref 4.0–10.5)
nRBC: 0 % (ref 0.0–0.2)

## 2023-07-15 LAB — RESP PANEL BY RT-PCR (RSV, FLU A&B, COVID)  RVPGX2
Influenza A by PCR: NEGATIVE
Influenza B by PCR: NEGATIVE
Resp Syncytial Virus by PCR: NEGATIVE
SARS Coronavirus 2 by RT PCR: NEGATIVE

## 2023-07-15 MED ORDER — PROCHLORPERAZINE EDISYLATE 10 MG/2ML IJ SOLN
5.0000 mg | Freq: Once | INTRAMUSCULAR | Status: AC
Start: 2023-07-15 — End: 2023-07-15
  Administered 2023-07-15: 5 mg via INTRAVENOUS
  Filled 2023-07-15: qty 2

## 2023-07-15 MED ORDER — DIPHENHYDRAMINE HCL 50 MG/ML IJ SOLN
25.0000 mg | Freq: Once | INTRAMUSCULAR | Status: AC
  Administered 2023-07-15: 25 mg via INTRAVENOUS
  Filled 2023-07-15: qty 1

## 2023-07-15 NOTE — ED Provider Notes (Signed)
[facility] Provider Note    Event Date/Time   First MD Initiated Contact with Patient 07/15/23 1641     (approximate)   History   Chief Complaint Migraine   HPI  Sherry Owens is a 82 y.o. female with past medical history of hypertension and atrial fibrillation on Eliquis who presents to the ED complaining of headache.  Patient reports that she has been dealing with a gradually worsening headache located on the top of her head over the past 2 days.  She describes it as a dull throbbing that does not seem to be exacerbated or alleviated by anything in particular.  She has not had any light sensitivity, does endorse some nausea without vomiting.  She denies any vision changes, speech changes, numbness, or weakness.  She has not had any fevers or neck stiffness.  She describes a history of migraines when she was much younger but has not had any recent migraines.  She has been taking Tylenol without relief.     Physical Exam   Triage Vital Signs: ED Triage Vitals  Encounter Vitals Group     BP      Systolic BP Percentile      Diastolic BP Percentile      Pulse      Resp      Temp      Temp src      SpO2      Weight      Height      Head Circumference      Peak Flow      Pain Score      Pain Loc      Pain Education      Exclude from Growth Chart     Most recent vital signs: Vitals:   07/15/23 1945 07/15/23 1956  BP:  (!) 142/98  Pulse: 79   Resp: 19   Temp:    SpO2: 97%     Constitutional: Alert and oriented. Eyes: Conjunctivae are normal.  Pupils equal, round, and reactive to light bilaterally. Head: Atraumatic. Nose: No congestion/rhinnorhea. Mouth/Throat: Mucous membranes are moist.  Neck: Supple with no meningismus. Cardiovascular: Normal rate, regular rhythm. Grossly normal heart sounds.  2+ radial pulses bilaterally. Respiratory: Normal respiratory effort.  No retractions. Lungs CTAB. Gastrointestinal: Soft and nontender. No  distention. Musculoskeletal: No lower extremity tenderness nor edema.  Neurologic:  Normal speech and language. No gross focal neurologic deficits are appreciated.    ED Results / Procedures / Treatments   Labs (all labs ordered are listed, but only abnormal results are displayed) Labs Reviewed  CBC WITH DIFFERENTIAL/PLATELET - Abnormal; Notable for the following components:      Result Value   Platelets 128 (*)    All other components within normal limits  BASIC METABOLIC PANEL - Abnormal; Notable for the following components:   Sodium 132 (*)    CO2 21 (*)    Glucose, Bld 100 (*)    BUN 25 (*)    Creatinine, Ser 1.36 (*)    Calcium 8.8 (*)    GFR, Estimated 39 (*)    All other components within normal limits  RESP PANEL BY RT-PCR (RSV, FLU A&B, COVID)  [id number]    ED ECG REPORT I, Chesley Noon, the attending physician, personally viewed and interpreted this ECG.   Date: 07/15/2023  EKG Time: 16:48  Rate: 82  Rhythm: normal sinus rhythm  Axis: Normal  Intervals:none  ST&T Change: None  RADIOLOGY CT head reviewed and interpreted by me with no hemorrhage or midline shift.  PROCEDURES:  Critical Care performed: No  Procedures   MEDICATIONS ORDERED IN ED: Medications  prochlorperazine (COMPAZINE) injection 5 mg (5 mg Intravenous Given 07/15/23 1711)  diphenhydrAMINE (BENADRYL) injection 25 mg (25 mg Intravenous Given 07/15/23 1710)     IMPRESSION / MDM / ASSESSMENT AND PLAN / ED COURSE  I reviewed the triage vital signs and the nursing notes.                              82 y.o. female with past medical history of hypertension and atrial fibrillation on Eliquis who presents to the ED complaining of gradually worsening headache described as a throbbing at the top of her head for the past 2 days.  Patient's presentation is most consistent with acute presentation with potential threat to life or bodily function.  Differential diagnosis includes, but is not  limited to, SAH, meningitis, tension headache, migraine headache, anemia, electrolyte abnormality.  Patient nontoxic-appearing and in no acute distress, vital signs are remarkable for fever initially, however I am concerned this could be an error.  Patient with no symptoms to suggest infectious process and no findings whatsoever concerning for meningitis.  Nursing staff recheck temperature shortly afterwards and found it to be 99.1 with no administration of antipyretic.  She describes a gradually worsening headache and has no focal neurologic deficits on exam, low suspicion for [facility].  Given her advanced age with new onset headache, we will check CT head and labs.  Plan to treat symptomatically with IV Compazine and Benadryl, reassess following labs and imaging.  CT head is negative for acute process, labs show stable chronic kidney disease without acute electrolyte abnormality and no significant anemia or leukocytosis noted.  Recheck of temperature is 98.0, COVID and flu testing is negative, suspicion for meningitis remains very low.  Patient reports feeling much better on reassessment and is requesting to be discharged home.  She was counseled to follow-up with her PCP and to return to the ED for new or worsening symptoms.  Patient and family agree with plan.      FINAL CLINICAL IMPRESSION(S) / ED DIAGNOSES   Final diagnoses:  Migraine without status migrainosus, not intractable, unspecified migraine type     Rx / DC Orders   ED Discharge Orders     None        Note:  This document was prepared using Dragon voice recognition software and may include unintentional dictation errors.   Chesley Noon, MD 07/15/23 Rosamaria Lints

## 2023-07-15 NOTE — ED Notes (Signed)
Pt at CT

## 2023-07-15 NOTE — ED Triage Notes (Signed)
Pt from home via EMS for migraine that started this past Tuesday w/ history of the same. Pt has been constipated as well. EMS gave report to Dr. Larinda Buttery.

## 2023-07-25 ENCOUNTER — Other Ambulatory Visit: Payer: Self-pay

## 2023-07-25 DIAGNOSIS — Z96653 Presence of artificial knee joint, bilateral: Secondary | ICD-10-CM | POA: Insufficient documentation

## 2023-07-25 DIAGNOSIS — Z7901 Long term (current) use of anticoagulants: Secondary | ICD-10-CM | POA: Diagnosis not present

## 2023-07-25 DIAGNOSIS — I10 Essential (primary) hypertension: Secondary | ICD-10-CM | POA: Diagnosis not present

## 2023-07-25 DIAGNOSIS — M79605 Pain in left leg: Secondary | ICD-10-CM | POA: Diagnosis not present

## 2023-07-25 DIAGNOSIS — M79604 Pain in right leg: Secondary | ICD-10-CM | POA: Insufficient documentation

## 2023-07-25 NOTE — ED Triage Notes (Signed)
First RN Note: pt to ED via [facility] from home with c/o bilateral thigh pain from calf to her hips. Per EMS pt was dx with DVT several months ago and is currently taking eliquis.   Per EMS VS as follows:  96%  18R 158/93 [id number]  Per EMS pt last took Tylenol yesterday and has had her Eliquis today.

## 2023-07-25 NOTE — ED Triage Notes (Signed)
See first nurse note. Pt is CAOx4, in no acute distress.  Pt advised both legs are hurting x 2 weeks. She feels it is arthritis. Pain is lower leg all the way up.

## 2023-07-26 ENCOUNTER — Emergency Department
Admission: EM | Admit: 2023-07-26 | Discharge: 2023-07-26 | Disposition: A | Discharge status: Home / Self Care | Payer: 59 | Attending: Emergency Medicine | Admitting: Emergency Medicine

## 2023-07-26 ENCOUNTER — Emergency Department: Payer: 59

## 2023-07-26 DIAGNOSIS — M79604 Pain in right leg: Secondary | ICD-10-CM | POA: Diagnosis not present

## 2023-07-26 LAB — CBC
HCT: 35.8 % — ABNORMAL LOW (ref 36.0–46.0)
Hemoglobin: 11.8 g/dL — ABNORMAL LOW (ref 12.0–15.0)
MCH: 32 pg (ref 26.0–34.0)
MCHC: 33 g/dL (ref 30.0–36.0)
MCV: 97 fL (ref 80.0–100.0)
Platelets: 256 10*3/uL (ref 150–400)
RBC: 3.69 MIL/uL — ABNORMAL LOW (ref 3.87–5.11)
RDW: 12.8 % (ref 11.5–15.5)
WBC: 6.5 10*3/uL (ref 4.0–10.5)
nRBC: 0 % (ref 0.0–0.2)

## 2023-07-26 LAB — COMPREHENSIVE METABOLIC PANEL
ALT: 17 U/L (ref 0–44)
AST: 25 U/L (ref 15–41)
Albumin: 3.3 g/dL — ABNORMAL LOW (ref 3.5–5.0)
Alkaline Phosphatase: 52 U/L (ref 38–126)
Anion gap: 8 (ref 5–15)
BUN: 16 mg/dL (ref 8–23)
CO2: 23 mmol/L (ref 22–32)
Calcium: 8.9 mg/dL (ref 8.9–10.3)
Chloride: 103 mmol/L (ref 98–111)
Creatinine, Ser: 1.22 mg/dL — ABNORMAL HIGH (ref 0.44–1.00)
GFR, Estimated: 44 mL/min — ABNORMAL LOW (ref >60)
Glucose, Bld: 95 mg/dL (ref 70–99)
Potassium: 4.2 mmol/L (ref 3.5–5.1)
Sodium: 134 mmol/L — ABNORMAL LOW (ref 135–145)
Total Bilirubin: 1.2 mg/dL (ref 0.3–1.2)
Total Protein: 7.5 g/dL (ref 6.5–8.1)

## 2023-07-26 LAB — D-DIMER, QUANTITATIVE: D-Dimer, Quant: 1.32 ug{FEU}/mL — ABNORMAL HIGH (ref 0.00–0.50)

## 2023-07-26 MED ORDER — ACETAMINOPHEN 500 MG PO TABS
1000.0000 mg | ORAL_TABLET | Freq: Once | ORAL | Status: AC
  Administered 2023-07-26: 1000 mg via ORAL
  Filled 2023-07-26: qty 2

## 2023-07-26 NOTE — ED Provider Notes (Signed)
[facility] Provider Note    Event Date/Time   First MD Initiated Contact with Patient 07/26/23 [phone]     (approximate)   History   Leg Pain (Bilateral x 2 weeks)   HPI  Sherry Owens is a 82 y.o. female   Past medical history of atrial fibrillation on Eliquis, osteoporosis, hypertension, arthritis, bilateral knee replacements who presents with bilateral leg pain several weeks, atraumatic.  Has been ambulatory despite the pain.  No skin changes.    She states DVT diagnosed recently has been compliant with blood thinners.  Pain described as from thigh all the way down ankle.  No other acute medical complaints.    External Medical Documents Reviewed: Office visit with internal medicine dated April 2024 documenting past medical history and medications      Physical Exam   Triage Vital Signs: ED Triage Vitals  Encounter Vitals Group     BP 07/25/23 2354 (!) 169/78     Systolic BP Percentile --      Diastolic BP Percentile --      Pulse Rate 07/25/23 2354 70     Resp 07/25/23 2354 18     Temp 07/25/23 2354 98 F (36.7 C)     Temp Source 07/25/23 2354 Oral     SpO2 07/25/23 2354 98 %     Weight 07/25/23 2355 163 lb 2.3 oz (74 kg)     Height 07/25/23 2355 5\' 5"  (1.651 m)     Head Circumference --      Peak Flow --      Pain Score 07/25/23 2355 10     Pain Loc --      Pain Education --      Exclude from Growth Chart --     Most recent vital signs: Vitals:   07/25/23 2354  BP: (!) 169/78  Pulse: 70  Resp: 18  Temp: 98 F (36.7 C)  SpO2: 98%    General: Awake, no distress.  CV:  Good peripheral perfusion.  Resp:  Normal effort.  Abd:  No distention.  Other:  No significant No lateral swelling or infectious changes to bilateral lower extremities, strong posterior tibial pulse bilaterally, able to range fully at the hip knee and ankle.   ED Results / Procedures / Treatments   Labs (all labs ordered are listed, but only  abnormal results are displayed) Labs Reviewed  CBC - Abnormal; Notable for the following components:      Result Value   RBC 3.69 (*)    Hemoglobin 11.8 (*)    HCT 35.8 (*)    All other components within normal limits  COMPREHENSIVE METABOLIC PANEL - Abnormal; Notable for the following components:   Sodium 134 (*)    Creatinine, Ser 1.22 (*)    Albumin 3.3 (*)    GFR, Estimated 44 (*)    All other components within normal limits  D-DIMER, QUANTITATIVE - Abnormal; Notable for the following components:   D-Dimer, Quant 1.32 (*)    All other components within normal limits     I ordered and reviewed the above labs they are notable for cell counts electrolytes unremarkable, no marked change from prior testing   RADIOLOGY I independently reviewed and interpreted DVT ultrasound see no DVT I also reviewed radiologist's formal read.   PROCEDURES:  Critical Care performed: No  Procedures   MEDICATIONS ORDERED IN ED: Medications  acetaminophen (TYLENOL) tablet 1,000 mg (has no administration in time range)  IMPRESSION / MDM / ASSESSMENT AND PLAN / ED COURSE  I reviewed the triage vital signs and the nursing notes.                                Patient's presentation is most consistent with acute presentation with potential threat to life or bodily function.  Differential diagnosis includes, but is not limited to, osteoarthritis, septic joint, fracture or dislocation, DVT, claudication, ischemia   The patient is on the cardiac monitor to evaluate for evidence of arrhythmia and/or significant heart rate changes.  MDM:    Patient with bilateral lower extremity pain.  No trauma to suggest fracture dislocation, full active range of motion no bony tenderness.  Check for DVT, negative, already anticoagulated for A-fib.  Considered ischemia but strong pulses bilaterally rules against this.  Perhaps due to arthritis, doubt septic joint, fracture dislocation, ischemia, or other  emergent pathologies.  Follow-up with prior orthopedist and primary doctor.       FINAL CLINICAL IMPRESSION(S) / ED DIAGNOSES   Final diagnoses:  Bilateral leg pain     Rx / DC Orders   ED Discharge Orders     None        Note:  This document was prepared using Dragon voice recognition software and may include unintentional dictation errors.    Pilar Jarvis, MD 07/26/23 [phone]

## 2023-07-26 NOTE — Discharge Instructions (Signed)
Fortunately your evaluation in the emergency department did not show any signs of emergency conditions like broken bones, dislocations, infections or blood clots to explain your pain.  Take Tylenol 650 mg every 6 hours as needed for pain.  Call your primary doctor for a follow-up appointment this week.  Call your orthopedist for follow-up appointment regarding your joint pain.  Thank you for choosing [place] for your health care today!  Please see your primary doctor this week for a follow up appointment.   If you have any new, worsening, or unexpected symptoms call your doctor right away or come back to the emergency department for reevaluation.  It was my pleasure to care for you today.   Daneil Dan Modesto Charon, MD

## 2023-08-24 ENCOUNTER — Other Ambulatory Visit: Payer: Self-pay | Admitting: Physician Assistant

## 2023-08-24 DIAGNOSIS — R911 Solitary pulmonary nodule: Secondary | ICD-10-CM

## 2023-08-24 DIAGNOSIS — J9811 Atelectasis: Secondary | ICD-10-CM

## 2023-10-12 ENCOUNTER — Other Ambulatory Visit: Payer: Self-pay | Admitting: Physician Assistant

## 2023-10-12 DIAGNOSIS — R911 Solitary pulmonary nodule: Secondary | ICD-10-CM

## 2023-10-12 DIAGNOSIS — J9811 Atelectasis: Secondary | ICD-10-CM

## 2023-10-20 ENCOUNTER — Ambulatory Visit
Admission: RE | Admit: 2023-10-20 | Discharge: 2023-10-20 | Disposition: A | Discharge status: Home / Self Care | Payer: 59 | Source: Ambulatory Visit | Attending: Physician Assistant | Admitting: Physician Assistant

## 2023-10-20 DIAGNOSIS — R911 Solitary pulmonary nodule: Secondary | ICD-10-CM | POA: Insufficient documentation

## 2023-10-20 DIAGNOSIS — J9811 Atelectasis: Secondary | ICD-10-CM | POA: Diagnosis present

## 2023-10-20 MED ORDER — IOHEXOL 300 MG/ML  SOLN
60.0000 mL | Freq: Once | INTRAMUSCULAR | Status: AC | PRN
  Administered 2023-10-20: 60 mL via INTRAVENOUS

## 2024-06-21 ENCOUNTER — Emergency Department

## 2024-06-21 ENCOUNTER — Emergency Department: Admission: EM | Admit: 2024-06-21 | Discharge: 2024-06-21 | Disposition: A | Discharge status: Home / Self Care

## 2024-06-21 ENCOUNTER — Other Ambulatory Visit: Payer: Self-pay

## 2024-06-21 DIAGNOSIS — R079 Chest pain, unspecified: Secondary | ICD-10-CM | POA: Insufficient documentation

## 2024-06-21 DIAGNOSIS — R0602 Shortness of breath: Secondary | ICD-10-CM | POA: Insufficient documentation

## 2024-06-21 DIAGNOSIS — I10 Essential (primary) hypertension: Secondary | ICD-10-CM | POA: Insufficient documentation

## 2024-06-21 DIAGNOSIS — Z7901 Long term (current) use of anticoagulants: Secondary | ICD-10-CM | POA: Diagnosis not present

## 2024-06-21 LAB — CBC
HCT: 41 % (ref 36.0–46.0)
Hemoglobin: 13.5 g/dL (ref 12.0–15.0)
MCH: 32.1 pg (ref 26.0–34.0)
MCHC: 32.9 g/dL (ref 30.0–36.0)
MCV: 97.6 fL (ref 80.0–100.0)
Platelets: 168 K/uL (ref 150–400)
RBC: 4.2 MIL/uL (ref 3.87–5.11)
RDW: 13.2 % (ref 11.5–15.5)
WBC: 4.3 K/uL (ref 4.0–10.5)
nRBC: 0 % (ref 0.0–0.2)

## 2024-06-21 LAB — HEPATIC FUNCTION PANEL
ALT: 14 U/L (ref 0–44)
AST: 25 U/L (ref 15–41)
Albumin: 3.6 g/dL (ref 3.5–5.0)
Alkaline Phosphatase: 97 U/L (ref 38–126)
Bilirubin, Direct: 0.2 mg/dL (ref 0.0–0.2)
Indirect Bilirubin: 0.9 mg/dL (ref 0.3–0.9)
Total Bilirubin: 1.1 mg/dL (ref 0.0–1.2)
Total Protein: 7.5 g/dL (ref 6.5–8.1)

## 2024-06-21 LAB — TROPONIN I (HIGH SENSITIVITY)
Troponin I (High Sensitivity): 5 ng/L (ref <18)
Troponin I (High Sensitivity): 6 ng/L (ref <18)

## 2024-06-21 LAB — BASIC METABOLIC PANEL WITH GFR
Anion gap: 7 (ref 5–15)
BUN: 23 mg/dL (ref 8–23)
CO2: 24 mmol/L (ref 22–32)
Calcium: 9.3 mg/dL (ref 8.9–10.3)
Chloride: 105 mmol/L (ref 98–111)
Creatinine, Ser: 1.43 mg/dL — ABNORMAL HIGH (ref 0.44–1.00)
GFR, Estimated: 36 mL/min — ABNORMAL LOW (ref >60)
Glucose, Bld: 109 mg/dL — ABNORMAL HIGH (ref 70–99)
Potassium: 4.2 mmol/L (ref 3.5–5.1)
Sodium: 136 mmol/L (ref 135–145)

## 2024-06-21 LAB — MAGNESIUM: Magnesium: 2.1 mg/dL (ref 1.7–2.4)

## 2024-06-21 LAB — D-DIMER, QUANTITATIVE: D-Dimer, Quant: 0.48 ug{FEU}/mL (ref 0.00–0.50)

## 2024-06-21 LAB — BRAIN NATRIURETIC PEPTIDE: B Natriuretic Peptide: 74.4 pg/mL (ref 0.0–100.0)

## 2024-06-21 LAB — LIPASE, BLOOD: Lipase: 51 U/L (ref 11–51)

## 2024-06-21 NOTE — ED Triage Notes (Signed)
 Pt to ED via [facility] from home. Pt reports centralized CP x1 hr PTA. Pt states chest felt heavy. RA initally low 90s and placed on 2L [place]. Pt is on Eliquis .  HR 62 Singular run of VTACH 10-12 beats  324mg  ASA 2 nitroglycerin  spray  500cc LR 18g RAC CBG 108

## 2024-06-21 NOTE — ED Provider Notes (Signed)
  Physical Exam  BP (!) 143/88   Pulse 62   Temp 97.9 F (36.6 C) (Oral)   Resp 18   SpO2 95%   Physical Exam  Procedures  Procedures  ED Course / MDM   Clinical Course as of 06/21/24 1723  Wed Jun 21, 2024  1422 ED EKG [HD]  1422 ED EKG [HD]  1457 Troponin I (High Sensitivity): 5 First troponin not elevated.  [HD]  1602 S/o: 73F afib on eliquis , htn - last stress test >5 yrs ago, no prior cath - cp while working around house - s/p ASA, ntg -- cp resolved  Trop wnl 1 hr from episode Some calf pain on exam - US  RLE pending  TO DO: - f/u RLE - f/u rpt trop - if workup reassuring, tentative plan for dc w/ cards referral [MM]  1656 RLE DVT YS: IMPRESSION: No evidence of right lower extremity DVT.   [MM]  1709 Dimer wnl [MM]  1719 Rpt trop stable [MM]  1722 Patient reevaluated, no recurrent episodes of chest discomfort.  Discussed overall reassuring workup.  Given age and risk factors, did offer admission for expedited cardiac workup though patient prefers to follow-up outpatient with a cardiologist--I believe this is very reasonable given reassuring testing today and will place referral for her to follow-up with cardiologist per signout plan.  ED return precautions in place.  Patient agrees with plan. [MM]    Clinical Course User Index [HD] Nicholaus Rolland BRAVO, MD [MM] [name] LABOR, MD   Medical Decision Making Amount and/or Complexity of Data Reviewed Labs: ordered. Decision-making details documented in ED Course. Radiology: ordered. ECG/medicine tests:  Decision-making details documented in ED Course.          [name] LABOR, MD 06/21/24 1723

## 2024-06-21 NOTE — ED Notes (Signed)
 Per secretary Pt will son will be here in 5 minutes to pick her up.

## 2024-06-21 NOTE — ED Provider Notes (Signed)
 [facility] Provider Note    Event Date/Time   First MD Initiated Contact with Patient 06/21/24 1420     (approximate)   History   Chest Pain   HPI  Sherry Owens is a 83 y.o. female with a past medical history of atrial fibrillation on Eliquis , anxiety, depression, hypertension who presents from home with 1 hour of chest pain.  Patient was in her normal state of health this morning and did some chores around the household when she felt lightheaded and developed chest pain with associated shortness of breath.  Pain was midsternal and the chest felt heavy.  EMS was called and she was given 324 mg of aspirin  and 2 doses of nitroglycerin  spray.  They thought they may have noticed an arrhythmia (possible wide A-fib versus V. Tach for 10-12 beats).  Patient reports that initially pain was 10 out of 10 but currently a 2 out of 10.  She states that she has not seen a cardiologist and does not have one and the last stress test was over 5 years ago.  Her son presents with her and contributes to the history   Physical Exam   Triage Vital Signs: ED Triage Vitals [06/21/24 1352]  Encounter Vitals Group     BP (!) 143/88     Girls Systolic BP Percentile      Girls Diastolic BP Percentile      Boys Systolic BP Percentile      Boys Diastolic BP Percentile      Pulse Rate 62     Resp 18     Temp 97.9 F (36.6 C)     Temp Source Oral     SpO2 95 %     Weight      Height      Head Circumference      Peak Flow      Pain Score 6     Pain Loc      Pain Education      Exclude from Growth Chart     Most recent vital signs: Vitals:   06/21/24 1352  BP: (!) 143/88  Pulse: 62  Resp: 18  Temp: 97.9 F (36.6 C)  SpO2: 95%    Nursing Triage Note reviewed. Vital signs reviewed and patients oxygen saturation is normoxic  General: Patient is well nourished, well developed, awake and alert, Head: Normocephalic and atraumatic Eyes: Normal inspection, extraocular  muscles intact, no conjunctival pallor Ear, nose, throat: Normal external exam Neck: Normal range of motion Respiratory: Patient is in no respiratory distress, lungs CTAB Cardiovascular: Patient is not tachycardic, RR No chest wall tenderness to palpation GI: Abd SNT with no guarding or rebound  Back: Normal inspection of the back with good strength and range of motion throughout all ext Extremities: pulses intact with good cap refills, patient has right lower extremity tenderness to palpation that she states that was not there previously Neuro: The patient is alert and oriented to person, place, and time, appropriately conversive, with 5/5 bilat UE/LE strength, no gross motor or sensory defects noted. Coordination appears to be adequate. Skin: Warm, dry, and intact Psych: normal mood and affect, no SI or HI  ED Results / Procedures / Treatments   Labs (all labs ordered are listed, but only abnormal results are displayed) Labs Reviewed  BASIC METABOLIC PANEL WITH GFR - Abnormal; Notable for the following components:      Result Value   Glucose, Bld 109 (*)  Creatinine, Ser 1.43 (*)    GFR, Estimated 36 (*)    All other components within normal limits  CBC  MAGNESIUM  LIPASE, BLOOD  BRAIN NATRIURETIC PEPTIDE  D-DIMER, QUANTITATIVE  HEPATIC FUNCTION PANEL  TROPONIN I (HIGH SENSITIVITY)  TROPONIN I (HIGH SENSITIVITY)     EKG EKG and rhythm strip are interpreted by myself at 06/21/24 1357  EKG: [Normal sinus rhythm] at heart rate of 59, normal QRS duration, QTc [406, normal  ST segments and T waves no ectopy EKG not consistent with Acute STEMI Rhythm strip: NSR in lead II   RADIOLOGY XR chest: No acute abnormalities on my independent interpretation along with radiologist US  RLE doppler: Pending    PROCEDURES:  Critical Care performed: No  Procedures   MEDICATIONS ORDERED IN ED: Medications - No data to display   IMPRESSION / MDM / ASSESSMENT AND PLAN / ED  COURSE           HEART Score: 4                     Summary: Very nice 83 year old female with a history of A-fib on Eliquis  and hypertension presents to the emergency department with 1 hour of chest pain now improved with nitro and aspirin .  She does not have a cardiologist.  Also found to have calf tenderness to palpation and EMS was concerned about arrhythmia that was transient   Differential Diagnosis including by not limited to: ACS, arrhythmia, PE, DVT, pneumonia  ED course: Patient is well-appearing and EKG demonstrates no evidence of acute ischemia.  Initial troponin was not elevated.  Chest x-ray was reassuring without any acute abnormality.  Patient's initial heart score was 4.  Given her calf pain and her symptoms decision made to obtain a Doppler ultrasound of the right lower extremity and a D-dimer which are pending.  Patient and son at bedside in agreement with workup and anticipate patient will be signed out to oncoming physician at 4 PM   Risk: 5 This patient has a high risk of morbidity due to further diagnostic testing or treatment. Rationale: This patient's evaluation and management involve a high risk of morbidity due to the potential severity of presenting symptoms, need for diagnostic testing, and/or initiation of treatment that may require close monitoring. The differential includes conditions with potential for significant deterioration or requiring escalation of care. Treatment decisions in the ED, including medication administration, procedural interventions, or disposition planning, reflect this level of risk. Additional Support: -- Drug therapy requiring intensive monitoring for toxicity [ ]  -- Decision regarding elective major surgery with idenitified patient or procedure risk factors [ ]  -- Decision regarding hospitalization or escalation of hospital-level care [ ]  -- Decision not to resuscitate or to de-escalate care because of poor prognosis [ ]  -- Parental  controlled substances [ ]   COPA: 5 The patient has a severe exacerbation, progression, or side effect of treatment of the following illness/illnesses: []  OR  The patient has the following acute or chronic illness/injury that poses a possible threat to life or bodily function: [X] : The patient has a potentially serious acute condition or an acute exacerbation of a chronic illness requiring urgent evaluation and management in the Emergency Department. The clinical presentation necessitates immediate consideration of life-threatening or function-threatening diagnoses, even if they are ultimately ruled out.  Data(2/3 categories following were performed): 5 I reviewed or ordered at least three unique tests, external notes, and/or the history required an independent historian as one of  the three requirements as following: Office visit note from 01/03/2024, CBC, BMP, D-dimer, troponin, send at bedside AND  I independently interpreted the following test: X-ray of chest OR  I discussed the management of the patient with the following external physician or qualified healthcare provider: []     Suggested E/M Coding Level: 5, 99285, This has been selected based on the 07/18/2022 CPT guidelines for E/M codes in the Emergency Department based on 2/3 of the CoPA, Data, and Risk.    Clinical Course as of 06/21/24 1533  Wed Jun 21, 2024  1422 ED EKG [HD]  1422 ED EKG [HD]  1457 Troponin I (High Sensitivity): 5 First troponin not elevated.  [HD]    Clinical Course User Index [HD] Nicholaus Rolland BRAVO, MD     FINAL CLINICAL IMPRESSION(S) / ED DIAGNOSES   Final diagnoses:  None     Rx / DC Orders   ED Discharge Orders     None        Note:  This document was prepared using Dragon voice recognition software and may include unintentional dictation errors.   Nicholaus Rolland BRAVO, MD 06/21/24 1535

## 2024-06-21 NOTE — Discharge Instructions (Signed)
 Your evaluation in the emergency department was overall reassuring, but I do recommend you get evaluated by a cardiologist for further evaluation of your chest discomfort today--I placed a referral for this, and they will contact you to schedule an appointment.  Please also follow-up with your primary care provider.  Return to the emergency department with any new or worsening symptoms.  You can continue to take all of your medications as already prescribed.

## 2024-06-21 NOTE — ED Notes (Signed)
 Pt just got back from scans. Blood work pulled at this time.

## 2024-07-06 ENCOUNTER — Inpatient Hospital Stay
Admission: EM | Admit: 2024-07-06 | Discharge: 2024-07-08 | DRG: 313 | Disposition: A | Discharge status: Home / Self Care | Attending: Student | Admitting: Student

## 2024-07-06 ENCOUNTER — Emergency Department

## 2024-07-06 ENCOUNTER — Other Ambulatory Visit: Payer: Self-pay

## 2024-07-06 DIAGNOSIS — I4729 Other ventricular tachycardia: Secondary | ICD-10-CM | POA: Diagnosis not present

## 2024-07-06 DIAGNOSIS — G459 Transient cerebral ischemic attack, unspecified: Secondary | ICD-10-CM | POA: Diagnosis present

## 2024-07-06 DIAGNOSIS — Z7901 Long term (current) use of anticoagulants: Secondary | ICD-10-CM

## 2024-07-06 DIAGNOSIS — E739 Lactose intolerance, unspecified: Secondary | ICD-10-CM | POA: Diagnosis present

## 2024-07-06 DIAGNOSIS — R079 Chest pain, unspecified: Principal | ICD-10-CM | POA: Diagnosis present

## 2024-07-06 DIAGNOSIS — F419 Anxiety disorder, unspecified: Secondary | ICD-10-CM | POA: Diagnosis present

## 2024-07-06 DIAGNOSIS — Q254 Congenital malformation of aorta unspecified: Secondary | ICD-10-CM

## 2024-07-06 DIAGNOSIS — R0789 Other chest pain: Principal | ICD-10-CM | POA: Diagnosis present

## 2024-07-06 DIAGNOSIS — I4891 Unspecified atrial fibrillation: Secondary | ICD-10-CM | POA: Diagnosis present

## 2024-07-06 DIAGNOSIS — Z7983 Long term (current) use of bisphosphonates: Secondary | ICD-10-CM

## 2024-07-06 DIAGNOSIS — Z604 Social exclusion and rejection: Secondary | ICD-10-CM | POA: Diagnosis present

## 2024-07-06 DIAGNOSIS — Z8249 Family history of ischemic heart disease and other diseases of the circulatory system: Secondary | ICD-10-CM

## 2024-07-06 DIAGNOSIS — Z79899 Other long term (current) drug therapy: Secondary | ICD-10-CM

## 2024-07-06 DIAGNOSIS — Z825 Family history of asthma and other chronic lower respiratory diseases: Secondary | ICD-10-CM

## 2024-07-06 DIAGNOSIS — I480 Paroxysmal atrial fibrillation: Secondary | ICD-10-CM | POA: Diagnosis present

## 2024-07-06 DIAGNOSIS — Z5982 Transportation insecurity: Secondary | ICD-10-CM

## 2024-07-06 DIAGNOSIS — Z96653 Presence of artificial knee joint, bilateral: Secondary | ICD-10-CM | POA: Diagnosis present

## 2024-07-06 DIAGNOSIS — I472 Ventricular tachycardia, unspecified: Secondary | ICD-10-CM | POA: Diagnosis present

## 2024-07-06 DIAGNOSIS — K219 Gastro-esophageal reflux disease without esophagitis: Secondary | ICD-10-CM | POA: Diagnosis present

## 2024-07-06 DIAGNOSIS — I10 Essential (primary) hypertension: Secondary | ICD-10-CM | POA: Diagnosis present

## 2024-07-06 LAB — BASIC METABOLIC PANEL WITH GFR
Anion gap: 8 (ref 5–15)
BUN: 20 mg/dL (ref 8–23)
CO2: 24 mmol/L (ref 22–32)
Calcium: 9.4 mg/dL (ref 8.9–10.3)
Chloride: 107 mmol/L (ref 98–111)
Creatinine, Ser: 1.21 mg/dL — ABNORMAL HIGH (ref 0.44–1.00)
GFR, Estimated: 44 mL/min — ABNORMAL LOW (ref >60)
Glucose, Bld: 112 mg/dL — ABNORMAL HIGH (ref 70–99)
Potassium: 3.5 mmol/L (ref 3.5–5.1)
Sodium: 139 mmol/L (ref 135–145)

## 2024-07-06 LAB — CBC
HCT: 40.5 % (ref 36.0–46.0)
Hemoglobin: 13.6 g/dL (ref 12.0–15.0)
MCH: 32.1 pg (ref 26.0–34.0)
MCHC: 33.6 g/dL (ref 30.0–36.0)
MCV: 95.5 fL (ref 80.0–100.0)
Platelets: 175 K/uL (ref 150–400)
RBC: 4.24 MIL/uL (ref 3.87–5.11)
RDW: 13.2 % (ref 11.5–15.5)
WBC: 4.6 K/uL (ref 4.0–10.5)
nRBC: 0 % (ref 0.0–0.2)

## 2024-07-06 LAB — MAGNESIUM: Magnesium: 2.1 mg/dL (ref 1.7–2.4)

## 2024-07-06 LAB — TSH: TSH: 0.577 u[IU]/mL (ref 0.350–4.500)

## 2024-07-06 LAB — TROPONIN I (HIGH SENSITIVITY)
Troponin I (High Sensitivity): 7 ng/L (ref <18)
Troponin I (High Sensitivity): 7 ng/L (ref <18)

## 2024-07-06 MED ORDER — IOHEXOL 350 MG/ML SOLN
75.0000 mL | Freq: Once | INTRAVENOUS | Status: AC | PRN
  Administered 2024-07-06: 70 mL via INTRAVENOUS

## 2024-07-06 MED ORDER — ACETAMINOPHEN 325 MG PO TABS
650.0000 mg | ORAL_TABLET | ORAL | Status: DC | PRN
Start: 2024-07-06 — End: 2024-07-08
  Administered 2024-07-07: 650 mg via ORAL
  Filled 2024-07-06: qty 2

## 2024-07-06 MED ORDER — POTASSIUM CHLORIDE CRYS ER 20 MEQ PO TBCR
40.0000 meq | EXTENDED_RELEASE_TABLET | Freq: Once | ORAL | Status: AC
  Administered 2024-07-06: 40 meq via ORAL
  Filled 2024-07-06: qty 2

## 2024-07-06 MED ORDER — METOPROLOL TARTRATE 5 MG/5ML IV SOLN: 5.0000 mg | Freq: Once | INTRAVENOUS | Status: DC

## 2024-07-06 MED ORDER — ACETAMINOPHEN 500 MG PO TABS
1000.0000 mg | ORAL_TABLET | Freq: Once | ORAL | Status: AC
  Administered 2024-07-06: 1000 mg via ORAL
  Filled 2024-07-06: qty 2

## 2024-07-06 MED ORDER — DILTIAZEM HCL ER COATED BEADS 180 MG PO CP24
180.0000 mg | ORAL_CAPSULE | Freq: Every day | ORAL | Status: DC
  Administered 2024-07-07 – 2024-07-08 (×2): 180 mg via ORAL
  Filled 2024-07-06 (×2): qty 1

## 2024-07-06 MED ORDER — LORAZEPAM 0.5 MG PO TABS: 0.5000 mg | ORAL_TABLET | Freq: Every day | ORAL | Status: DC | PRN

## 2024-07-06 MED ORDER — ONDANSETRON HCL 4 MG/2ML IJ SOLN: 4.0000 mg | Freq: Four times a day (QID) | INTRAMUSCULAR | Status: DC | PRN

## 2024-07-06 MED ORDER — LOSARTAN POTASSIUM 50 MG PO TABS
50.0000 mg | ORAL_TABLET | Freq: Every day | ORAL | Status: DC
  Administered 2024-07-07 – 2024-07-08 (×2): 50 mg via ORAL
  Filled 2024-07-06 (×2): qty 1

## 2024-07-06 MED ORDER — ESCITALOPRAM OXALATE 10 MG PO TABS
10.0000 mg | ORAL_TABLET | Freq: Every day | ORAL | Status: DC
  Administered 2024-07-06 – 2024-07-08 (×3): 10 mg via ORAL
  Filled 2024-07-06 (×3): qty 1

## 2024-07-06 MED ORDER — HYDROCODONE-ACETAMINOPHEN 5-325 MG PO TABS: 1.0000 | ORAL_TABLET | ORAL | Status: DC | PRN

## 2024-07-06 MED ORDER — SODIUM CHLORIDE 0.9 % IV BOLUS
1000.0000 mL | Freq: Once | INTRAVENOUS | Status: AC
  Administered 2024-07-06: 1000 mL via INTRAVENOUS

## 2024-07-06 MED ORDER — SODIUM CHLORIDE 0.9 % IV BOLUS
500.0000 mL | Freq: Once | INTRAVENOUS | Status: AC
  Administered 2024-07-06: 500 mL via INTRAVENOUS

## 2024-07-06 MED ORDER — MORPHINE SULFATE (PF) 2 MG/ML IV SOLN: 2.0000 mg | INTRAVENOUS | Status: DC | PRN

## 2024-07-06 NOTE — Assessment & Plan Note (Addendum)
 Addendum: -Following admission, patient started having 3-5 beat runs of V Tach and then had a prolonged 17 beat run of VTach  -IV metoprolol  ordered x1 -Continue cardiac monitoring -Will add on magnesium level and replete as needed -Maintain potassium over 4 and magnesium over 2-->2.1 -Ordering K-Dur 40 mg x 1 as potassium is 3.5. -Will trial a fluid bolus -EKG and get rhythm strip in chart -Will get echocardiogram -Will get cardiology consult

## 2024-07-06 NOTE — Assessment & Plan Note (Signed)
 Continue escitalopram  and ativan 

## 2024-07-06 NOTE — Assessment & Plan Note (Signed)
Continue losartan and diltiazem ?

## 2024-07-06 NOTE — Assessment & Plan Note (Signed)
 Chronic anticoagulation Holding Eliquis  due to uncertain nature of periaortic fluid seen on CTA chest Continue diltiazem 

## 2024-07-06 NOTE — H&P (Signed)
 History and Physical    Patient: Sherry Owens FMW:[id number] DOB: 01-03-1941 DOA: 07/06/2024 DOS: the patient was seen and examined on 07/06/2024 PCP: Marikay Eva POUR, PA  Patient coming from: Home  Chief Complaint:  Chief Complaint  Patient presents with   Chest Pain    HPI: Sherry Owens is a 83 y.o. female with medical history significant for Hypertension, atrial fibrillation on Eliquis  and anxiety being admitted for chest pain workup with concerning finding on the aorta CTA chest.  Patient developed sharp left-sided chest pain on the day of arrival, radiating to the back and worse with inspiration.  Denies associated shortness of breath, palpitations, lightheadedness, nausea vomiting or diaphoresis.  She denies cough, fever or chills.  Has no lower extremity pain or swelling.  She was previously in her usual state of health. In the ED, vitals were unremarkable except for mild tachypnea to 24.  CBC and BMP unremarkable and troponin 7. EKG nonacute .CBC, BMP and troponin were notable only for creatinine of 1.25 which is baseline. EKG non ischemic. CTA chest negative for PE but showed some concern for periaortic fluid in the descending thoracic aorta-please see official report. The ED provider spoke with vascular surgeon, Dr. Jama who reviewed the images and opined that it appeared as artifact but would like a CTA aorta. Admission requested pending washout to perform CTA aorta study.  Vascular will consult. Patient with low intensity chest pain 1 to 2 out of 10 which was treated with Tylenol . Admission requested.     Review of Systems: As mentioned in the history of present illness. All other systems reviewed and are negative.  Past Medical History:  Diagnosis Date   Anginal pain (HCC) 2019   currently has pain w/ stress   Anxiety    Arthritis    Cancer (HCC)    cancer within appendix per pt   Collagen vascular disease (HCC)    RA   Depression    Genital herpes  simplex    GERD (gastroesophageal reflux disease)    Hypertension    Osteoporosis    Past Surgical History:  Procedure Laterality Date   ABDOMINAL HYSTERECTOMY     COLONOSCOPY     COLONOSCOPY WITH PROPOFOL  N/A 05/04/2018   Procedure: COLONOSCOPY WITH PROPOFOL ;  Surgeon: Toledo, Ladell POUR, MD;  Location: [facility];  Service: Gastroenterology;  Laterality: N/A;   ESOPHAGOGASTRODUODENOSCOPY (EGD) WITH PROPOFOL  N/A 05/04/2018   Procedure: ESOPHAGOGASTRODUODENOSCOPY (EGD) WITH PROPOFOL ;  Surgeon: Toledo, Ladell POUR, MD;  Location: [facility]COPY;  Service: Gastroenterology;  Laterality: N/A;   JOINT REPLACEMENT Bilateral    bilateral total knees: R-2016, L-2017   LAPAROSCOPIC RIGHT HEMI COLECTOMY Right 09/07/2018   Procedure: LAPAROSCOPIC RIGHT HEMI COLECTOMY;  Surgeon: Tye Millet, DO;  Location: [facility] ORS;  Service: General;  Laterality: Right;   OOPHORECTOMY     TUBAL LIGATION     Social History:  reports that she has never smoked. She has never used smokeless tobacco. She reports that she does not drink alcohol and does not use drugs.  Allergies  Allergen Reactions   Lactose Intolerance (Gi) Diarrhea    Stomach upset and diarrhea    Family History  Problem Relation Age of Onset   Breast cancer Maternal Aunt    Pancreatic cancer Mother    Colon cancer Father    Asthma Sister    Pancreatic cancer Brother    COPD Sister    Hypertension Brother     Prior to Admission medications  Medication Sig Start Date End Date Taking? Authorizing Provider  acetaminophen  (TYLENOL ) 325 MG tablet Take 2 tablets (650 mg total) by mouth every 6 (six) hours as needed for mild pain. 09/12/18   Tye, Isami, DO  alendronate  (FOSAMAX ) 70 MG tablet Take 70 mg by mouth once a week. Take with a full glass of water on an empty stomach.    [provider]  apixaban  (ELIQUIS ) 5 MG TABS tablet Take 1 tablet (5 mg total) by mouth 2 (two) times daily. 10/04/21   Josette Ade, MD  diltiazem   (CARDIZEM  CD) 180 MG 24 hr capsule Take 1 capsule (180 mg total) by mouth daily. 10/04/21   Josette Ade, MD  escitalopram  (LEXAPRO ) 10 MG tablet Take 10 mg by mouth daily.    [provider]  fluticasone (FLONASE) 50 MCG/ACT nasal spray Place into both nostrils.    [provider]  LORazepam  (ATIVAN ) 0.5 MG tablet Take 0.5 mg by mouth daily at 8 pm.     [provider]  losartan  (COZAAR ) 100 MG tablet Take 100 mg by mouth daily.    [provider]  losartan  (COZAAR ) 25 MG tablet Take 50 mg by mouth daily. 08/20/21   [provider]  omeprazole (PRILOSEC) 20 MG capsule Take 20 mg by mouth daily.    [provider]  ondansetron  (ZOFRAN -ODT) 4 MG disintegrating tablet Take 1 tablet (4 mg total) by mouth every 6 (six) hours as needed for nausea or vomiting. 01/24/22   Ward, Josette SAILOR, DO  valACYclovir  (VALTREX ) 500 MG tablet Take 500 mg by mouth daily.     [provider]  vitamin B-12 (CYANOCOBALAMIN ) 1000 MCG tablet Take 1,000 mcg by mouth daily.    [provider]  Vitamin D, Ergocalciferol, (DRISDOL) 1.25 MG (50000 UNIT) CAPS capsule Take 50,000 Units by mouth once a week. 09/24/21   [provider]    Physical Exam: Vitals:   07/06/24 1714 07/06/24 1716 07/06/24 2100 07/06/24 2117  BP: (!) 149/94  126/88   Pulse: 63  70   Resp: 18  (!) 24   Temp: 98.3 F (36.8 C)   98.2 F (36.8 C)  TempSrc: Oral   Oral  SpO2: 96%  94%   Weight:  85.6 kg    Height:  5' 5 (1.651 m)     Physical Exam Vitals and nursing note reviewed.  Constitutional:      General: She is not in acute distress. HENT:     Head: Normocephalic and atraumatic.  Cardiovascular:     Rate and Rhythm: Normal rate and regular rhythm.     Heart sounds: Normal heart sounds.  Pulmonary:     Effort: Pulmonary effort is normal.     Breath sounds: Normal breath sounds.  Abdominal:     Palpations: Abdomen is soft.     Tenderness: There is no  abdominal tenderness.  Neurological:     Mental Status: Mental status is at baseline.     Labs on Admission: I have personally reviewed following labs and imaging studies  CBC: Recent Labs  Lab 07/06/24 1718  WBC 4.6  HGB 13.6  HCT 40.5  MCV 95.5  PLT 175   Basic Metabolic Panel: Recent Labs  Lab 07/06/24 1718  NA 139  K 3.5  CL 107  CO2 24  GLUCOSE 112*  BUN 20  CREATININE 1.21*  CALCIUM 9.4   GFR: Estimated Creatinine Clearance: 38 mL/min (A) (by C-G formula based on SCr of  1.21 mg/dL (H)). Liver Function Tests: No results for input(s): AST, ALT, ALKPHOS, BILITOT, PROT, ALBUMIN in the last 168 hours. No results for input(s): LIPASE, AMYLASE in the last 168 hours. No results for input(s): AMMONIA in the last 168 hours. Coagulation Profile: No results for input(s): INR, PROTIME in the last 168 hours. Cardiac Enzymes: No results for input(s): CKTOTAL, CKMB, CKMBINDEX, TROPONINI in the last 168 hours. BNP (last 3 results) No results for input(s): PROBNP in the last 8760 hours. HbA1C: No results for input(s): HGBA1C in the last 72 hours. CBG: No results for input(s): GLUCAP in the last 168 hours. Lipid Profile: No results for input(s): CHOL, HDL, LDLCALC, TRIG, CHOLHDL, LDLDIRECT in the last 72 hours. Thyroid  Function Tests: No results for input(s): TSH, T4TOTAL, FREET4, T3FREE, THYROIDAB in the last 72 hours. Anemia Panel: No results for input(s): VITAMINB12, FOLATE, FERRITIN, TIBC, IRON, RETICCTPCT in the last 72 hours. Urine analysis:    Component Value Date/Time   COLORURINE COLORLESS (A) 06/19/2023 0341   APPEARANCEUR CLEAR (A) 06/19/2023 0341   APPEARANCEUR Clear 01/09/2014 1145   LABSPEC 1.013 06/19/2023 0341   LABSPEC 1.015 01/09/2014 1145   PHURINE 8.0 06/19/2023 0341   GLUCOSEU NEGATIVE 06/19/2023 0341   GLUCOSEU Negative 01/09/2014 1145   HGBUR NEGATIVE 06/19/2023 0341    BILIRUBINUR NEGATIVE 06/19/2023 0341   BILIRUBINUR Negative 01/09/2014 1145   KETONESUR NEGATIVE 06/19/2023 0341   PROTEINUR NEGATIVE 06/19/2023 0341   NITRITE NEGATIVE 06/19/2023 0341   LEUKOCYTESUR NEGATIVE 06/19/2023 0341   LEUKOCYTESUR Negative 01/09/2014 1145    Radiological Exams on Admission: CT Angio Chest PE W and/or Wo Contrast Result Date: 07/06/2024 CLINICAL DATA:  Left-sided chest pain EXAM: CT ANGIOGRAPHY CHEST WITH CONTRAST TECHNIQUE: Multidetector CT imaging of the chest was performed using the standard protocol during bolus administration of intravenous contrast. Multiplanar CT image reconstructions and MIPs were obtained to evaluate the vascular anatomy. RADIATION DOSE REDUCTION: This exam was performed according to the departmental dose-optimization program which includes automated exposure control, adjustment of the mA and/or kV according to patient size and/or use of iterative reconstruction technique. CONTRAST:  70mL OMNIPAQUE  IOHEXOL  350 MG/ML SOLN COMPARISON:  Same day chest radiograph, CT chest dated 10/20/2023 FINDINGS: Cardiovascular: The study is high quality for the evaluation of pulmonary embolism. There are no filling defects in the central, lobar, segmental or subsegmental pulmonary artery branches to suggest acute pulmonary embolism. Ascending thoracic aorta measures 4.2 x 4.0 cm (4:57). Tortuous descending thoracic aorta. Slightly increased hypodensity adjacent to the left lateral descending thoracic aorta (4:73). Normal heart size. No significant pericardial fluid/thickening. Coronary artery calcifications and aortic atherosclerosis. Mediastinum/Nodes: Imaged thyroid  gland without nodules meeting criteria for imaging follow-up by size. Normal esophagus. No pathologically enlarged axillary, supraclavicular, mediastinal, or hilar lymph nodes. Lungs/Pleura: The central airways are patent. Subsegmental left lower lobe atelectasis. Unchanged subsegmental  atelectasis/consolidation in the basilar lingula. No focal consolidation. No pneumothorax. No pleural effusion. Upper abdomen: Partially imaged postsurgical changes of the ascending colon. Musculoskeletal: No acute or abnormal lytic or blastic osseous lesions. Multilevel degenerative changes of the thoracic spine. Review of the MIP images confirms the above findings. IMPRESSION: 1. No evidence of pulmonary embolism. 2. Slightly increased hypodensity adjacent to the left lateral descending thoracic aorta, which may represent a small amount of periaortic fluid. Consider further evaluation with dedicated CTA of the chest aorta protocol given reported left chest pain. 3. Ascending thoracic aorta measures 4.2 cm. Recommend annual imaging followup by CTA or MRA. This recommendation follows 2010  ACCF/AHA/AATS/ACR/ASA/SCA/SCAI/SIR/STS/SVM Guidelines for the Diagnosis and Management of Patients with Thoracic Aortic Disease. Circulation. 2010; 121: Z733-z630. Aortic aneurysm NOS (ICD10-I71.9) 4. Aortic Atherosclerosis (ICD10-I70.0). Coronary artery calcifications. Assessment for potential risk factor modification, dietary therapy or pharmacologic therapy may be warranted, if clinically indicated. Electronically Signed   By: Limin  Xu M.D.   On: 07/06/2024 18:46   DG Chest 2 View Result Date: 07/06/2024 CLINICAL DATA:  Chest pain. EXAM: CHEST - 2 VIEW COMPARISON:  Tray shin of dated 06/21/2024. FINDINGS: No focal consolidation, pleural effusion or pneumothorax. Stable cardiac silhouette. No acute osseous pathology. IMPRESSION: No active cardiopulmonary disease. Electronically Signed   By: Vanetta Chou M.D.   On: 07/06/2024 17:35   Data Reviewed for HPI: Relevant notes from primary care and specialist visits, past discharge summaries as available in EHR, including Care Everywhere. Prior diagnostic testing as pertinent to current admission diagnoses Updated medications and problem lists for reconciliation ED course,  including vitals, labs, imaging, treatment and response to treatment Triage notes, nursing and pharmacy notes and ED provider's notes Notable results as noted above in HPI      Assessment and Plan: * Chest pain Periaortic fluid in the descending thoracic aorta on CTA chest Uncertain chest pain etiology, appears nonischemic Troponin and EKG not suggesting ACS Pain control Cardiac monitoring Vascular consult for further diagnostic evaluation of abnormality seen on descending thoracic aorta Will hold Eliquis  in case of need for procedure  Will keep n.p.o. from midnight just in case   NSVT (nonsustained ventricular tachycardia) (HCC) Addendum: -Following admission, patient started having 3-5 beat runs of V Tach and then had a prolonged 17 beat run of VTach  -IV metoprolol  ordered x1 -Continue cardiac monitoring -Will add on magnesium level and replete as needed -Maintain potassium over 4 and magnesium over 2-->2.1 -Ordering K-Dur 40 mg x 1 as potassium is 3.5. -Will trial a fluid bolus -EKG and get rhythm strip in chart -Will get echocardiogram -Will get cardiology consult   A-fib (HCC) Chronic anticoagulation Holding Eliquis  due to uncertain nature of periaortic fluid seen on CTA chest Continue diltiazem   Essential hypertension Continue losartan  and diltiazem   Anxiety Continue escitalopram  and ativan         DVT prophylaxis: SCD  Consults: vascular, [facility]  Advance Care Planning:   Code Status: Prior   Family Communication: none  Disposition Plan: Back to previous home environment  Severity of Illness: The appropriate patient status for this patient is OBSERVATION. Observation status is judged to be reasonable and necessary in order to provide the required intensity of service to ensure the patient's safety. The patient's presenting symptoms, physical exam findings, and initial radiographic and laboratory data in the context of their medical condition  is felt to place them at decreased risk for further clinical deterioration. Furthermore, it is anticipated that the patient will be medically stable for discharge from the hospital within 2 midnights of admission.   CRITICAL CARE Performed by: Delayne LULLA Solian   Total critical care time: 75 minutes  Critical care time was exclusive of separately billable procedures and treating other patients.  Critical care was necessary to treat or prevent imminent or life-threatening deterioration.  Critical care was time spent personally by me on the following activities: development of treatment plan with patient and/or surrogate as well as nursing, discussions with consultants, evaluation of patient's response to treatment, examination of patient, obtaining history from patient or surrogate, ordering and performing treatments and interventions, ordering and review of laboratory studies, ordering  and review of radiographic studies, pulse oximetry and re-evaluation of patient's condition.   Author: Delayne LULLA Solian, MD 07/06/2024 9:31 PM  For on call review [email or web address].

## 2024-07-06 NOTE — ED Provider Notes (Signed)
 [facility] Provider Note    Event Date/Time   First MD Initiated Contact with Patient 07/06/24 1723     (approximate)  History   Chief Complaint: Chest Pain  HPI  Sherry Owens is a 83 y.o. female with a past medical history anxiety, arthritis, gastric reflux, hypertension, presents to the emergency department for left-sided chest pain.  According to the patient approximately 30 minutes prior to arrival she developed a sharp pain in her left chest that radiated to the back of her chest.  Patient states the pain is somewhat worse when she takes a deep breath.  Patient is on Eliquis  at baseline states she has not missed any doses.  Patient states she had a blood clot in her leg several years ago but is not had any further leg issues.  Denies any leg pain or swelling.  Patient states the chest pain somewhat relieved after getting nitroglycerin  by EMS but continues to have pain in that left chest at times which she describes as sharp.  Physical Exam   Triage Vital Signs: ED Triage Vitals  Encounter Vitals Group     BP 07/06/24 1714 (!) 149/94     Girls Systolic BP Percentile --      Girls Diastolic BP Percentile --      Boys Systolic BP Percentile --      Boys Diastolic BP Percentile --      Pulse Rate 07/06/24 1714 63     Resp 07/06/24 1714 18     Temp 07/06/24 1714 98.3 F (36.8 C)     Temp Source 07/06/24 1714 Oral     SpO2 07/06/24 1714 96 %     Weight 07/06/24 1716 188 lb 11.4 oz (85.6 kg)     Height 07/06/24 1716 5' 5 (1.651 m)     Head Circumference --      Peak Flow --      Pain Score 07/06/24 1714 8     Pain Loc --      Pain Education --      Exclude from Growth Chart --     Most recent vital signs: Vitals:   07/06/24 1714  BP: (!) 149/94  Pulse: 63  Resp: 18  Temp: 98.3 F (36.8 C)  SpO2: 96%    General: Awake, no distress.  CV:  Good peripheral perfusion.  Regular rate and rhythm  Resp:  Normal effort.  Equal breath sounds  bilaterally.  Chest wall nontender to palpation. Abd:  No distention.  Soft, nontender.  No rebound or guarding. Other:  No lower extreme edema or tenderness on my exam.   ED Results / Procedures / Treatments   EKG  EKG viewed and interpreted by myself shows a normal sinus rhythm at 60 bpm with a narrow QRS, normal axis, normal intervals, no concerning ST changes.  RADIOLOGY  I have reviewed interpreted chest x-ray images.  No consolidation on my evaluation. Radiology is read the x-ray as negative.   MEDICATIONS ORDERED IN ED: Medications - No data to display   IMPRESSION / MDM / ASSESSMENT AND PLAN / ED COURSE  I reviewed the triage vital signs and the nursing notes.  Patient's presentation is most consistent with acute presentation with potential threat to life or bodily function.  Patient presents to the emergency department for chest pain that started approximate 30 minutes prior to arrival.  Patient describes the pain as a sharp pain in her left chest somewhat worse with  inspiration that appears to radiate to the back of her chest.  Patient states she has had similar pains in the past, states she had similar pain 2 weeks ago ultimately with a largely negative/nonconcerning workup.  I reviewed the patient's workup and recent labs and imaging.  Patient has not had a CT scan of her chest in quite some time given the patient's acute onset of sharp chest pain radiating to the back somewhat pleuritic in nature with a history of prior DVT we will obtain a CTA of the chest as a precaution although patient is on Eliquis  which should be protective for a PE/DVT.  We will check labs including cardiac enzymes x 2 given the time of onset of the discomfort.  Will continue to closely monitor while awaiting further results.  EKG and chest x-ray are reassuring.  Patient's workup is overall reassuring troponin is negative x 2 CBC is normal chemistry shows no significant findings.  Patient CTA however  the chest is negative for PE but does show a hypodensity adjacent to the left lateral descending thoracic aorta which they state could represent periaortic fluid.  I spoke to Dr. Jama, vascular surgery, he does recommend admission overnight for monitoring followed by a CT of the aorta tomorrow after the patient has been hydrated given her age and contrast bolus already tonight.  Patient is agreeable to this plan.  States her chest pain is diminished from an 8/10 to currently a 1 or 2/10.  FINAL CLINICAL IMPRESSION(S) / ED DIAGNOSES   Chest pain  Note:  This document was prepared using Dragon voice recognition software and may include unintentional dictation errors.   [name], MD 07/06/24 2101

## 2024-07-06 NOTE — ED Triage Notes (Signed)
 EMS called out to patient's residence for left sided chest pain that radiates down left arm.   BP 172/96 HR 64 O2 97% RA   1.5 in NTG paste 324mg  ASA 20G RAC

## 2024-07-06 NOTE — Assessment & Plan Note (Signed)
 Periaortic fluid in the descending thoracic aorta on CTA chest Uncertain chest pain etiology, appears nonischemic Troponin and EKG not suggesting ACS Pain control Cardiac monitoring Vascular consult for further diagnostic evaluation of abnormality seen on descending thoracic aorta Will hold Eliquis  in case of need for procedure  Will keep n.p.o. from midnight just in case

## 2024-07-07 ENCOUNTER — Observation Stay
Admit: 2024-07-07 | Discharge: 2024-07-07 | Disposition: A | Discharge status: Home / Self Care | Attending: Internal Medicine | Admitting: Internal Medicine

## 2024-07-07 ENCOUNTER — Observation Stay: Admit: 2024-07-07

## 2024-07-07 DIAGNOSIS — F419 Anxiety disorder, unspecified: Secondary | ICD-10-CM | POA: Diagnosis present

## 2024-07-07 DIAGNOSIS — E739 Lactose intolerance, unspecified: Secondary | ICD-10-CM | POA: Diagnosis present

## 2024-07-07 DIAGNOSIS — I10 Essential (primary) hypertension: Secondary | ICD-10-CM | POA: Diagnosis present

## 2024-07-07 DIAGNOSIS — Z825 Family history of asthma and other chronic lower respiratory diseases: Secondary | ICD-10-CM | POA: Diagnosis not present

## 2024-07-07 DIAGNOSIS — G459 Transient cerebral ischemic attack, unspecified: Secondary | ICD-10-CM | POA: Diagnosis present

## 2024-07-07 DIAGNOSIS — I480 Paroxysmal atrial fibrillation: Secondary | ICD-10-CM | POA: Diagnosis present

## 2024-07-07 DIAGNOSIS — R079 Chest pain, unspecified: Secondary | ICD-10-CM | POA: Diagnosis present

## 2024-07-07 DIAGNOSIS — I472 Ventricular tachycardia, unspecified: Secondary | ICD-10-CM | POA: Diagnosis present

## 2024-07-07 DIAGNOSIS — K219 Gastro-esophageal reflux disease without esophagitis: Secondary | ICD-10-CM | POA: Diagnosis present

## 2024-07-07 DIAGNOSIS — Z7901 Long term (current) use of anticoagulants: Secondary | ICD-10-CM | POA: Diagnosis not present

## 2024-07-07 DIAGNOSIS — Z604 Social exclusion and rejection: Secondary | ICD-10-CM | POA: Diagnosis present

## 2024-07-07 DIAGNOSIS — Z8249 Family history of ischemic heart disease and other diseases of the circulatory system: Secondary | ICD-10-CM | POA: Diagnosis not present

## 2024-07-07 DIAGNOSIS — Z79899 Other long term (current) drug therapy: Secondary | ICD-10-CM | POA: Diagnosis not present

## 2024-07-07 DIAGNOSIS — Q254 Congenital malformation of aorta unspecified: Secondary | ICD-10-CM

## 2024-07-07 DIAGNOSIS — R0789 Other chest pain: Secondary | ICD-10-CM | POA: Diagnosis present

## 2024-07-07 DIAGNOSIS — Z7983 Long term (current) use of bisphosphonates: Secondary | ICD-10-CM | POA: Diagnosis not present

## 2024-07-07 DIAGNOSIS — Z96653 Presence of artificial knee joint, bilateral: Secondary | ICD-10-CM | POA: Diagnosis present

## 2024-07-07 DIAGNOSIS — Z5982 Transportation insecurity: Secondary | ICD-10-CM | POA: Diagnosis not present

## 2024-07-07 LAB — BASIC METABOLIC PANEL WITH GFR
Anion gap: 3 — ABNORMAL LOW (ref 5–15)
BUN: 25 mg/dL — ABNORMAL HIGH (ref 8–23)
CO2: 24 mmol/L (ref 22–32)
Calcium: 8.5 mg/dL — ABNORMAL LOW (ref 8.9–10.3)
Chloride: 110 mmol/L (ref 98–111)
Creatinine, Ser: 1.22 mg/dL — ABNORMAL HIGH (ref 0.44–1.00)
GFR, Estimated: 44 mL/min — ABNORMAL LOW (ref >60)
Glucose, Bld: 97 mg/dL (ref 70–99)
Potassium: 4.2 mmol/L (ref 3.5–5.1)
Sodium: 137 mmol/L (ref 135–145)

## 2024-07-07 LAB — ECHOCARDIOGRAM COMPLETE
AR max vel: 3.03 cm2
AV Area VTI: 3.39 cm2
AV Area mean vel: 3.02 cm2
AV Mean grad: 2 mmHg
AV Peak grad: 4 mmHg
Ao pk vel: 1 m/s
Area-P 1/2: 2.43 cm2
Height: 65 in
MV VTI: 2.56 cm2
S' Lateral: 2.4 cm
Weight: 2731.2 [oz_av]

## 2024-07-07 LAB — MAGNESIUM: Magnesium: 2.2 mg/dL (ref 1.7–2.4)

## 2024-07-07 MED ORDER — SODIUM CHLORIDE 0.9 % IV SOLN: INTRAVENOUS | Status: AC

## 2024-07-07 NOTE — Progress Notes (Signed)
 Progress Note   Patient: Sherry Owens FMW:[id number] DOB: 04-Aug-1941 DOA: 07/06/2024     0 DOS: the patient was seen and examined on 07/07/2024   Brief hospital course:  83 y.o. female with medical history significant for Hypertension, atrial fibrillation on Eliquis  and anxiety being admitted for chest pain workup with concerning finding on the aorta CTA chest.  Patient developed sharp left-sided chest pain on the day of arrival, radiating to the back and worse with inspiration.  Denies associated shortness of breath, palpitations, lightheadedness, nausea vomiting or diaphoresis.  She denies cough, fever or chills.  Has no lower extremity pain or swelling.  She was previously in her usual state of health. In the ED, vitals were unremarkable except for mild tachypnea to 24.  CBC and BMP unremarkable and troponin 7. EKG nonacute .CBC, BMP and troponin were notable only for creatinine of 1.25 which is baseline. EKG non ischemic. CTA chest negative for PE but showed some concern for periaortic fluid in the descending thoracic aorta-please see official report. The ED provider spoke with vascular surgeon, Dr. Jama who reviewed the images and opined that it appeared as artifact but would like a CTA aorta.Admission requested pending washout to perform CTA aorta study.  Vascular will consult. Patient with low intensity chest pain 1 to 2 out of 10 which was treated with Tylenol .  Assessment and Plan:  Chest pain / Periaortic fluid in the descending thoracic aorta on CTA chest  Uncertain chest pain etiology, appears nonischemic Troponin and EKG not suggesting ACS Pain control Cardiac monitoring Vascular consult for further diagnostic evaluation of abnormality seen on descending thoracic aorta recommends  CTA of the chest with dissection protocol to better evaluate this area of suggested ulceration possible dissection. Once this is completed we will evaluate for possible endovascular tag procedure.   Will hold Eliquis  in case of need for procedure  Will keep n.p.o. from midnight just in case  NSVT (nonsustained ventricular tachycardia)   -Following admission, patient started having 3-5 beat runs of V Tach and then had a prolonged 17 beat run of VTach  -IV metoprolol  ordered x1 -Continue cardiac monitoring -Will add on magnesium level and replete as needed -Maintain potassium over 4 and magnesium over 2-->2.1 -Ordering K-Dur 40 mg x 1 as potassium is 3.5. -Will trial a fluid bolus -EKG and get rhythm strip in chart - Echo pending  - Cardiology consulted recommendations appreciated  A-ib  /Chronic anticoagulation  Holding Eliquis  due to uncertain nature of periaortic fluid seen on CTA chest Continue diltiazem   Essential hypertension Continue losartan  and diltiazem   Anxiety Continue escitalopram  and ativan      Subjective: Patient seen and examined this morning in the morning rounds.  Was undergoing echo eval at the time of my evaluation.  No overnight events.  No complaint of substernal chest pain or shortness of breath.  Seen by cardiology and vascular recommendations appreciated  Physical Exam: Vitals:   07/06/24 2230 07/06/24 2247 07/07/24 0442 07/07/24 0745  BP:  (!) 154/81 129/74 (!) 152/81  Pulse:  (!) 59 (!) 56 (!) 53  Resp:  17 19   Temp:  97.9 F (36.6 C) 97.8 F (36.6 C) 97.8 F (36.6 C)  TempSrc:  Oral Oral   SpO2:  94% 95% 96%  Weight: 77.4 kg     Height: 5' 5 (1.651 m)      Physical Exam Eyes:     Pupils: Pupils are equal, round, and reactive to light.  Cardiovascular:  Rate and Rhythm: Normal rate.     Pulses: Normal pulses.  Pulmonary:     Effort: Pulmonary effort is normal.  Abdominal:     Palpations: Abdomen is soft.  Musculoskeletal:        General: Normal range of motion.  Skin:    General: Skin is warm.  Neurological:     General: No focal deficit present.     Mental Status: She is alert.     Data Reviewed:  There are no  new results to review at this time.  Family Communication: None by bedside  Disposition: Status is: Observation The patient remains OBS appropriate and will d/c before 2 midnights.  Planned Discharge Destination: Home    Time spent: 32 minutes  Author: Albina Sor, MD 07/07/2024 9:48 AM  For on call review [email or web address].

## 2024-07-07 NOTE — Consult Note (Addendum)
 [facility] CARDIOLOGY CONSULT NOTE       Patient ID: Sherry Owens MRN: [id number] DOB/AGE: [birth year] 83 y.o.  Admit date: 07/06/2024 Referring Physician Dr. Cleatus Primary Physician Marikay Eva POUR, PA Primary Cardiologist Dr. Ammon (2022) Reason for Consultation chest pain, 17 beat NSVT  HPI: Sherry Owens is a 83 y.o. female  with a past medical history of paroxsymal atrial fibrillation (on Eliquis ), paroxsymal SVT, hypertension who presented to the ED on 07/06/2024 for left sided sharp pleuritic chest pain that radiated to back. CTA chest revealed concern for periaortic fluid in descending thoracic aorta. Vascular surgeon reviewed imaged and requested a CTA aorta to further evaluate. While admitted, on tele patient had 1 episode of 17 beat run of NSVT. Patient has no known history of MI or HF. Cardiology was consulted for further evaluation.   Work up in the ED notable for NA 139, K 3.5, Cr 1.21, Mg 2.1, Hgb 13.6, plts 175. TSH within normal limits. CTA without pulmonary embolism. CTA revealed periaortic fluid in descending thoracic aorta. EKG with sinus rhythm without acute ischemic changes. Trops negative x2. CXR with no acute cardiopulmonary disease. Patient given IV fluids and potassium.  At the time of my evaluation this AM, patient was resting comfortably in hospital bed.  We discussed patient's symptoms in further detail.  Patient states she had sudden onset sharp chest pain that worsens with breathing that radiated to her back.  Patient states her chest pain resolved after a few hours and has not recurred.  Currently patient is without any chest pain.  Patient denies any SOB, palpitations, lightheadedness or syncope.  Patient denies history of CAD, MI or HF.   Review of systems complete and found to be negative unless listed above    Past Medical History:  Diagnosis Date   Anginal pain (HCC) 2019   currently has pain w/ stress   Anxiety    Arthritis     Cancer (HCC)    cancer within appendix per pt   Collagen vascular disease (HCC)    RA   Depression    Genital herpes simplex    GERD (gastroesophageal reflux disease)    Hypertension    Osteoporosis     Past Surgical History:  Procedure Laterality Date   ABDOMINAL HYSTERECTOMY     COLONOSCOPY     COLONOSCOPY WITH PROPOFOL  N/A 05/04/2018   Procedure: COLONOSCOPY WITH PROPOFOL ;  Surgeon: Toledo, Ladell POUR, MD;  Location: [facility] ENDOSCOPY;  Service: Gastroenterology;  Laterality: N/A;   ESOPHAGOGASTRODUODENOSCOPY (EGD) WITH PROPOFOL  N/A 05/04/2018   Procedure: ESOPHAGOGASTRODUODENOSCOPY (EGD) WITH PROPOFOL ;  Surgeon: Toledo, Ladell POUR, MD;  Location: [facility] ENDOSCOPY;  Service: Gastroenterology;  Laterality: N/A;   JOINT REPLACEMENT Bilateral    bilateral total knees: R-2016, L-2017   LAPAROSCOPIC RIGHT HEMI COLECTOMY Right 09/07/2018   Procedure: LAPAROSCOPIC RIGHT HEMI COLECTOMY;  Surgeon: Tye Millet, DO;  Location: [facility] ORS;  Service: General;  Laterality: Right;   OOPHORECTOMY     TUBAL LIGATION      Medications Prior to Admission  Medication Sig Dispense Refill Last Dose/Taking   acetaminophen  (TYLENOL ) 325 MG tablet Take 2 tablets (650 mg total) by mouth every 6 (six) hours as needed for mild pain. 30 tablet 0    alendronate  (FOSAMAX ) 70 MG tablet Take 70 mg by mouth once a week. Take with a full glass of water on an empty stomach.      apixaban  (ELIQUIS ) 5 MG TABS tablet Take 1 tablet (5 mg total)  by mouth 2 (two) times daily. 60 tablet 0    diltiazem  (CARDIZEM  CD) 180 MG 24 hr capsule Take 1 capsule (180 mg total) by mouth daily. 30 capsule 0    escitalopram  (LEXAPRO ) 10 MG tablet Take 10 mg by mouth daily.      fluticasone (FLONASE) 50 MCG/ACT nasal spray Place into both nostrils.      LORazepam  (ATIVAN ) 0.5 MG tablet Take 0.5 mg by mouth daily at 8 pm.       losartan  (COZAAR ) 100 MG tablet Take 100 mg by mouth daily.      losartan  (COZAAR ) 25 MG tablet Take 50 mg by mouth daily.       omeprazole (PRILOSEC) 20 MG capsule Take 20 mg by mouth daily.      ondansetron  (ZOFRAN -ODT) 4 MG disintegrating tablet Take 1 tablet (4 mg total) by mouth every 6 (six) hours as needed for nausea or vomiting. 20 tablet 0    valACYclovir  (VALTREX ) 500 MG tablet Take 500 mg by mouth daily.       vitamin B-12 (CYANOCOBALAMIN ) 1000 MCG tablet Take 1,000 mcg by mouth daily.      Vitamin D, Ergocalciferol, (DRISDOL) 1.25 MG (50000 UNIT) CAPS capsule Take 50,000 Units by mouth once a week.      Social History   Socioeconomic History   Marital status: Single    Spouse name: Not on file   Number of children: Not on file   Years of education: Not on file   Highest education level: Not on file  Occupational History   Occupation: Education officer, environmental    Comment: retired / disability  Tobacco Use   Smoking status: Never   Smokeless tobacco: Never  Vaping Use   Vaping status: Never Used  Substance and Sexual Activity   Alcohol use: No   Drug use: Never   Sexual activity: Not on file  Other Topics Concern   Not on file  Social History Narrative   Not on file   Social Drivers of Health   Financial Resource Strain: Low Risk  (03/16/2023)   Received from [facility]   Overall Financial Resource Strain (CARDIA)    Difficulty of Paying Living Expenses: Not hard at all  Food Insecurity: No Food Insecurity (07/06/2024)   Hunger Vital Sign    Worried About Running Out of Food in the Last Year: Never true    Ran Out of Food in the Last Year: Never true  Transportation Needs: Unmet Transportation Needs (07/06/2024)   PRAPARE - Administrator, Civil Service (Medical): Yes    Lack of Transportation (Non-Medical): No  Physical Activity: Not on file  Stress: Not on file  Social Connections: Socially Isolated (07/06/2024)   Social Connection and Isolation Panel    Frequency of Communication with Friends and Family: Three times a week    Frequency of Social Gatherings with  Friends and Family: Once a week    Attends Religious Services: Never    Database administrator or Organizations: No    Attends Banker Meetings: Never    Marital Status: Widowed  Intimate Partner Violence: Not At Risk (07/06/2024)   Humiliation, Afraid, Rape, and Kick questionnaire    Fear of Current or Ex-Partner: No    Emotionally Abused: No    Physically Abused: No    Sexually Abused: No    Family History  Problem Relation Age of Onset   Breast cancer Maternal Aunt  Pancreatic cancer Mother    Colon cancer Father    Asthma Sister    Pancreatic cancer Brother    COPD Sister    Hypertension Brother      Vitals:   07/06/24 2117 07/06/24 2230 07/06/24 2247 07/07/24 0442  BP:   (!) 154/81 129/74  Pulse:   (!) 59 (!) 56  Resp:   17 19  Temp: 98.2 F (36.8 C)  97.9 F (36.6 C) 97.8 F (36.6 C)  TempSrc: Oral  Oral Oral  SpO2:   94% 95%  Weight:  77.4 kg    Height:  5' 5 (1.651 m)      PHYSICAL EXAM General: Chronically ill elderly female, well nourished, in no acute distress. HEENT: Normocephalic and atraumatic. Neck: No JVD.   Lungs: Normal respiratory effort on room air. Clear bilaterally to auscultation. No wheezes, crackles, rhonchi.  Heart: HRRR. Normal S1 and S2 without gallops or murmurs.  Abdomen: Non-distended appearing.  Msk: Normal strength and tone for age. Extremities: Warm and well perfused. No clubbing, cyanosis, edema.  Neuro: Alert and oriented X 3. Psych: Answers questions appropriately.   Labs: Basic Metabolic Panel: Recent Labs    07/06/24 1718 07/07/24 0315  NA 139 137  K 3.5 4.2  CL 107 110  CO2 24 24  GLUCOSE 112* 97  BUN 20 25*  CREATININE 1.21* 1.22*  CALCIUM 9.4 8.5*  MG 2.1 2.2   Liver Function Tests: No results for input(s): AST, ALT, ALKPHOS, BILITOT, PROT, ALBUMIN in the last 72 hours. No results for input(s): LIPASE, AMYLASE in the last 72 hours. CBC: Recent Labs    07/06/24 1718  WBC  4.6  HGB 13.6  HCT 40.5  MCV 95.5  PLT 175   Cardiac Enzymes: Recent Labs    07/06/24 1718 07/06/24 1950  TROPONINIHS 7 7   BNP: No results for input(s): BNP in the last 72 hours. D-Dimer: No results for input(s): DDIMER in the last 72 hours. Hemoglobin A1C: No results for input(s): HGBA1C in the last 72 hours. Fasting Lipid Panel: No results for input(s): CHOL, HDL, LDLCALC, TRIG, CHOLHDL, LDLDIRECT in the last 72 hours. Thyroid  Function Tests: Recent Labs    07/06/24 1718  TSH 0.577   Anemia Panel: No results for input(s): VITAMINB12, FOLATE, FERRITIN, TIBC, IRON, RETICCTPCT in the last 72 hours.   Radiology: CT Angio Chest PE W and/or Wo Contrast Result Date: 07/06/2024 CLINICAL DATA:  Left-sided chest pain EXAM: CT ANGIOGRAPHY CHEST WITH CONTRAST TECHNIQUE: Multidetector CT imaging of the chest was performed using the standard protocol during bolus administration of intravenous contrast. Multiplanar CT image reconstructions and MIPs were obtained to evaluate the vascular anatomy. RADIATION DOSE REDUCTION: This exam was performed according to the departmental dose-optimization program which includes automated exposure control, adjustment of the mA and/or kV according to patient size and/or use of iterative reconstruction technique. CONTRAST:  70mL OMNIPAQUE  IOHEXOL  350 MG/ML SOLN COMPARISON:  Same day chest radiograph, CT chest dated 10/20/2023 FINDINGS: Cardiovascular: The study is high quality for the evaluation of pulmonary embolism. There are no filling defects in the central, lobar, segmental or subsegmental pulmonary artery branches to suggest acute pulmonary embolism. Ascending thoracic aorta measures 4.2 x 4.0 cm (4:57). Tortuous descending thoracic aorta. Slightly increased hypodensity adjacent to the left lateral descending thoracic aorta (4:73). Normal heart size. No significant pericardial fluid/thickening. Coronary artery calcifications and  aortic atherosclerosis. Mediastinum/Nodes: Imaged thyroid  gland without nodules meeting criteria for imaging follow-up by size. Normal esophagus. No  pathologically enlarged axillary, supraclavicular, mediastinal, or hilar lymph nodes. Lungs/Pleura: The central airways are patent. Subsegmental left lower lobe atelectasis. Unchanged subsegmental atelectasis/consolidation in the basilar lingula. No focal consolidation. No pneumothorax. No pleural effusion. Upper abdomen: Partially imaged postsurgical changes of the ascending colon. Musculoskeletal: No acute or abnormal lytic or blastic osseous lesions. Multilevel degenerative changes of the thoracic spine. Review of the MIP images confirms the above findings. IMPRESSION: 1. No evidence of pulmonary embolism. 2. Slightly increased hypodensity adjacent to the left lateral descending thoracic aorta, which may represent a small amount of periaortic fluid. Consider further evaluation with dedicated CTA of the chest aorta protocol given reported left chest pain. 3. Ascending thoracic aorta measures 4.2 cm. Recommend annual imaging followup by CTA or MRA. This recommendation follows 2010 ACCF/AHA/AATS/ACR/ASA/SCA/SCAI/SIR/STS/SVM Guidelines for the Diagnosis and Management of Patients with Thoracic Aortic Disease. Circulation. 2010; 121: Z733-z630. Aortic aneurysm NOS (ICD10-I71.9) 4. Aortic Atherosclerosis (ICD10-I70.0). Coronary artery calcifications. Assessment for potential risk factor modification, dietary therapy or pharmacologic therapy may be warranted, if clinically indicated. Electronically Signed   By: Limin  Xu M.D.   On: 07/06/2024 18:46   DG Chest 2 View Result Date: 07/06/2024 CLINICAL DATA:  Chest pain. EXAM: CHEST - 2 VIEW COMPARISON:  Tray shin of dated 06/21/2024. FINDINGS: No focal consolidation, pleural effusion or pneumothorax. Stable cardiac silhouette. No acute osseous pathology. IMPRESSION: No active cardiopulmonary disease. Electronically Signed    By: Vanetta Chou M.D.   On: 07/06/2024 17:35   US  Venous Img Lower Unilateral Right Result Date: 06/21/2024 CLINICAL DATA:  Calf pain EXAM: Right LOWER EXTREMITY VENOUS DOPPLER ULTRASOUND TECHNIQUE: Gray-scale sonography with compression, as well as color and duplex ultrasound, were performed to evaluate the deep venous system(s) from the level of the common femoral vein through the popliteal and proximal calf veins. COMPARISON:  Ultrasound 07/26/2023. FINDINGS: VENOUS Normal compressibility of the common femoral, superficial femoral, and popliteal veins, as well as the visualized calf veins. Visualized portions of profunda femoral vein and great saphenous vein unremarkable. No filling defects to suggest DVT on grayscale or color Doppler imaging. Doppler waveforms show normal direction of venous flow, normal respiratory plasticity and response to augmentation. Limited views of the contralateral common femoral vein are unremarkable. OTHER None. Limitations: none IMPRESSION: No evidence of right lower extremity DVT. Electronically Signed   By: Ranell Bring M.D.   On: 06/21/2024 16:22   DG Chest 2 View Result Date: 06/21/2024 CLINICAL DATA:  Chest pain EXAM: CHEST - 2 VIEW COMPARISON:  X-ray 06/19/2023.  CT scan 10/20/2023. FINDINGS: Normal cardiopericardial silhouette. No consolidation, pneumothorax or effusion. No edema. Calcified and ectatic aorta. Curvature and degenerative changes of the spine. IMPRESSION: No acute cardiopulmonary disease. Electronically Signed   By: Ranell Bring M.D.   On: 06/21/2024 15:18    ECHO ordered  TELEMETRY reviewed by me 07/07/2024: Sinus rhythm, rate 50s.  EKG reviewed by me: sinus rhythm, arte 60 bpm without acute ischemic changes.   Data reviewed by me 07/07/2024: last 24h vitals tele labs imaging I/O ED provider note, admission H&P.  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 female  with a past medical history of paroxsymal atrial fibrillation (on Eliquis ), paroxsymal SVT, hypertension  who presented to the ED on 07/06/2024 for left sided sharp pleuritic chest pain that radiated to back. CTA chest revealed concern for periaortic fluid in descending thoracic aorta. Vascular surgeon reviewed imaged and requested a CTA aorta to further evaluate. While admitted, on tele patient had 1 episode of 17 beat run of NSVT. Patient has no known history of MI or HF. Cardiology was consulted for further evaluation.   # Frequent PVCs # Paroxsymal atrial fibrillation # Paroxsymal SVT Trops negative x2. EKG with sinus rhythm without acute ischemic changes.  Reported to have a 17 beats of NSVT, however no available strips to see this episode. Reviewed all available EKGs and telemetry since admission and no significant NSVT noted.  Evidence of a few PVCs however not significant.  Per tele this AM sinus rhythm, rate 50s. -Echo ordered. Further recommendations pending results. -Monitor and replenish electrolytes for a goal K >4, Mag >2  -Home Eliquis  held in case of need for procedure. Resume when able for stroke risk reduction. -Continue home Cardizem  180 mg daily.   #Hypertension -Continue home losartan  50 mg daily. (Home dose 100 mg) -Cardizem  as stated above.  # Abnormal CTA (periaortic fluid in descending aorta) -Vascular surgery consulted for further diagnostic eval evaluation of abnormality found on CTA.   This patient's plan of care was discussed and created with Dr. Wilburn and he is in agreement.  Signed: Dorene Comfort, PA-C  07/07/2024, 7:06 AM [facility] Card[facility]

## 2024-07-07 NOTE — Progress Notes (Signed)
*  PRELIMINARY RESULTS* Echocardiogram 2D Echocardiogram has been performed.  Sherry Owens 07/07/2024, 12:12 PM

## 2024-07-07 NOTE — Plan of Care (Signed)

## 2024-07-07 NOTE — Consult Note (Signed)
 Hospital Consult    Reason for Consult:  Thoracic Aortic Disease Requesting Physician:  Dr Delayne Solian MD  MRN #:  [id number]  History of Present Illness: This is a 83 y.o. female with medical history significant for Hypertension, atrial fibrillation on Eliquis  and anxiety being admitted for chest pain workup with concerning finding on the aorta CTA chest.  Patient developed sharp left-sided chest pain on the day of arrival, radiating to the back and worse with inspiration.  Denies associated shortness of breath, palpitations, lightheadedness, nausea vomiting or diaphoresis.  She denies cough, fever or chills.  Has no lower extremity pain or swelling.   Past Medical History:  Diagnosis Date   Anginal pain (HCC) 2019   currently has pain w/ stress   Anxiety    Arthritis    Cancer (HCC)    cancer within appendix per pt   Collagen vascular disease (HCC)    RA   Depression    Genital herpes simplex    GERD (gastroesophageal reflux disease)    Hypertension    Osteoporosis     Past Surgical History:  Procedure Laterality Date   ABDOMINAL HYSTERECTOMY     COLONOSCOPY     COLONOSCOPY WITH PROPOFOL  N/A 05/04/2018   Procedure: COLONOSCOPY WITH PROPOFOL ;  Surgeon: Toledo, Ladell POUR, MD;  Location: [facility];  Service: Gastroenterology;  Laterality: N/A;   ESOPHAGOGASTRODUODENOSCOPY (EGD) WITH PROPOFOL  N/A 05/04/2018   Procedure: ESOPHAGOGASTRODUODENOSCOPY (EGD) WITH PROPOFOL ;  Surgeon: Toledo, Ladell POUR, MD;  Location: [facility];  Service: Gastroenterology;  Laterality: N/A;   JOINT REPLACEMENT Bilateral    bilateral total knees: R-2016, L-2017   LAPAROSCOPIC RIGHT HEMI COLECTOMY Right 09/07/2018   Procedure: LAPAROSCOPIC RIGHT HEMI COLECTOMY;  Surgeon: Tye Millet, DO;  Location: [facility];  Service: General;  Laterality: Right;   OOPHORECTOMY     TUBAL LIGATION      Allergies  Allergen Reactions   Lactose Intolerance (Gi) Diarrhea    Stomach upset and diarrhea     Prior to Admission medications   Medication Sig Start Date End Date Taking? Authorizing Provider  acetaminophen  (TYLENOL ) 325 MG tablet Take 2 tablets (650 mg total) by mouth every 6 (six) hours as needed for mild pain. 09/12/18   Sakai, Isami, DO  alendronate  (FOSAMAX ) 70 MG tablet Take 70 mg by mouth once a week. Take with a full glass of water on an empty stomach.    [provider]  apixaban  (ELIQUIS ) 5 MG TABS tablet Take 1 tablet (5 mg total) by mouth 2 (two) times daily. 10/04/21   Josette Ade, MD  diltiazem  (CARDIZEM  CD) 180 MG 24 hr capsule Take 1 capsule (180 mg total) by mouth daily. 10/04/21   Josette Ade, MD  escitalopram  (LEXAPRO ) 10 MG tablet Take 10 mg by mouth daily.    [provider]  fluticasone (FLONASE) 50 MCG/ACT nasal spray Place into both nostrils.    [provider]  LORazepam  (ATIVAN ) 0.5 MG tablet Take 0.5 mg by mouth daily at 8 pm.     [provider]  losartan  (COZAAR ) 100 MG tablet Take 100 mg by mouth daily.    [provider]  losartan  (COZAAR ) 25 MG tablet Take 50 mg by mouth daily. 08/20/21   [provider]  omeprazole (PRILOSEC) 20 MG capsule Take 20 mg by mouth daily.    [provider]  ondansetron  (ZOFRAN -ODT) 4 MG disintegrating tablet Take 1 tablet (4 mg total) by mouth every 6 (six) hours as needed for  nausea or vomiting. 01/24/22   Ward, Josette SAILOR, DO  valACYclovir  (VALTREX ) 500 MG tablet Take 500 mg by mouth daily.     [provider]  vitamin B-12 (CYANOCOBALAMIN ) 1000 MCG tablet Take 1,000 mcg by mouth daily.    [provider]  Vitamin D, Ergocalciferol, (DRISDOL) 1.25 MG (50000 UNIT) CAPS capsule Take 50,000 Units by mouth once a week. 09/24/21   [provider]    Social History   Socioeconomic History   Marital status: Single    Spouse name: Not on file   Number of children: Not on file   Years of education: Not on file   Highest education  level: Not on file  Occupational History   Occupation: Education officer, environmental    Comment: retired / disability  Tobacco Use   Smoking status: Never   Smokeless tobacco: Never  Vaping Use   Vaping status: Never Used  Substance and Sexual Activity   Alcohol use: No   Drug use: Never   Sexual activity: Not on file  Other Topics Concern   Not on file  Social History Narrative   Not on file   Social Drivers of Health   Financial Resource Strain: Low Risk  (03/16/2023)   Received from [facility]   Overall Financial Resource Strain (CARDIA)    Difficulty of Paying Living Expenses: Not hard at all  Food Insecurity: No Food Insecurity (07/06/2024)   Hunger Vital Sign    Worried About Running Out of Food in the Last Year: Never true    Ran Out of Food in the Last Year: Never true  Transportation Needs: Unmet Transportation Needs (07/06/2024)   PRAPARE - Administrator, Civil Service (Medical): Yes    Lack of Transportation (Non-Medical): No  Physical Activity: Not on file  Stress: Not on file  Social Connections: Socially Isolated (07/06/2024)   Social Connection and Isolation Panel    Frequency of Communication with Friends and Family: Three times a week    Frequency of Social Gatherings with Friends and Family: Once a week    Attends Religious Services: Never    Database administrator or Organizations: No    Attends Banker Meetings: Never    Marital Status: Widowed  Intimate Partner Violence: Not At Risk (07/06/2024)   Humiliation, Afraid, Rape, and Kick questionnaire    Fear of Current or Ex-Partner: No    Emotionally Abused: No    Physically Abused: No    Sexually Abused: No     Family History  Problem Relation Age of Onset   Breast cancer Maternal Aunt    Pancreatic cancer Mother    Colon cancer Father    Asthma Sister    Pancreatic cancer Brother    COPD Sister    Hypertension Brother     ROS: Otherwise negative unless mentioned in  HPI  Physical Examination  Vitals:   07/06/24 2247 07/07/24 0442  BP: (!) 154/81 129/74  Pulse: (!) 59 (!) 56  Resp: 17 19  Temp: 97.9 F (36.6 C) 97.8 F (36.6 C)  SpO2: 94% 95%   Body mass index is 28.41 kg/m.  General:  WDWN in NAD Gait: Not observed HENT: WNL, normocephalic Pulmonary: normal non-labored breathing, without Rales, rhonchi,  wheezing Cardiac: irregular, History of Atrial Fibrillation, without  Murmurs, rubs or gallops; without carotid bruits Abdomen: Positive bowel sounds throughout, soft, NT/ND, no masses Skin: without rashes Vascular Exam/Pulses: Palpable pulses throughout al extremities  and all extremities are warm to touch.  Extremities: without ischemic changes, without Gangrene , without cellulitis; without open wounds;  Musculoskeletal: no muscle wasting or atrophy  Neurologic: A&O X 3;  No focal weakness or paresthesias are detected; speech is fluent/normal Psychiatric:  The pt has Normal affect. Lymph:  Unremarkable  CBC    Component Value Date/Time   WBC 4.6 07/06/2024 1718   RBC 4.24 07/06/2024 1718   HGB 13.6 07/06/2024 1718   HGB 11.5 (L) 01/25/2014 0538   HCT 40.5 07/06/2024 1718   HCT 40.7 01/09/2014 1145   PLT 175 07/06/2024 1718   PLT 105 (L) 01/25/2014 0538   MCV 95.5 07/06/2024 1718   MCV 99 01/09/2014 1145   MCH 32.1 07/06/2024 1718   MCHC 33.6 07/06/2024 1718   RDW 13.2 07/06/2024 1718   RDW 13.2 01/09/2014 1145   LYMPHSABS 2.3 07/15/2023 1712   MONOABS 0.9 07/15/2023 1712   EOSABS 0.0 07/15/2023 1712   BASOSABS 0.1 07/15/2023 1712    BMET    Component Value Date/Time   NA 137 07/07/2024 0315   NA 137 01/25/2014 0538   K 4.2 07/07/2024 0315   K 3.6 01/25/2014 0538   CL 110 07/07/2024 0315   CL 107 01/25/2014 0538   CO2 24 07/07/2024 0315   CO2 27 01/25/2014 0538   GLUCOSE 97 07/07/2024 0315   GLUCOSE 99 01/25/2014 0538   BUN 25 (H) 07/07/2024 0315   BUN 16 01/25/2014 0538   CREATININE 1.22 (H) 07/07/2024 0315    CREATININE 1.21 01/25/2014 0538   CALCIUM 8.5 (L) 07/07/2024 0315   CALCIUM 8.8 01/25/2014 0538   GFRNONAA 44 (L) 07/07/2024 0315   GFRNONAA 45 (L) 01/25/2014 0538   GFRAA 44 (L) 06/14/2020 1632   GFRAA 52 (L) 01/25/2014 0538    COAGS: Lab Results  Component Value Date   INR 0.8 01/09/2014   INR 0.9 11/15/2013     Non-Invasive Vascular Imaging:   EXAM:07/06/24 CT ANGIOGRAPHY CHEST WITH CONTRAST   TECHNIQUE: Multidetector CT imaging of the chest was performed using the standard protocol during bolus administration of intravenous contrast. Multiplanar CT image reconstructions and MIPs were obtained to evaluate the vascular anatomy.   RADIATION DOSE REDUCTION: This exam was performed according to the departmental dose-optimization program which includes automated exposure control, adjustment of the mA and/or kV according to patient size and/or use of iterative reconstruction technique.   CONTRAST:  70mL OMNIPAQUE  IOHEXOL  350 MG/ML SOLN   COMPARISON:  Same day chest radiograph, CT chest dated 10/20/2023   FINDINGS: Cardiovascular: The study is high quality for the evaluation of pulmonary embolism. There are no filling defects in the central, lobar, segmental or subsegmental pulmonary artery branches to suggest acute pulmonary embolism. Ascending thoracic aorta measures 4.2 x 4.0 cm (4:57). Tortuous descending thoracic aorta. Slightly increased hypodensity adjacent to the left lateral descending thoracic aorta (4:73). Normal heart size. No significant pericardial fluid/thickening. Coronary artery calcifications and aortic atherosclerosis.   Mediastinum/Nodes: Imaged thyroid  gland without nodules meeting criteria for imaging follow-up by size. Normal esophagus. No pathologically enlarged axillary, supraclavicular, mediastinal, or hilar lymph nodes.   Lungs/Pleura: The central airways are patent. Subsegmental left lower lobe atelectasis. Unchanged  subsegmental atelectasis/consolidation in the basilar lingula. No focal consolidation. No pneumothorax. No pleural effusion.   Upper abdomen: Partially imaged postsurgical changes of the ascending colon.   Musculoskeletal: No acute or abnormal lytic or blastic osseous lesions. Multilevel degenerative changes of the thoracic spine.   Review of  the MIP images confirms the above findings.   IMPRESSION: 1. No evidence of pulmonary embolism. 2. Slightly increased hypodensity adjacent to the left lateral descending thoracic aorta, which may represent a small amount of periaortic fluid. Consider further evaluation with dedicated CTA of the chest aorta protocol given reported left chest pain. 3. Ascending thoracic aorta measures 4.2 cm. Recommend annual imaging followup by CTA or MRA. This recommendation follows 2010 ACCF/AHA/AATS/ACR/ASA/SCA/SCAI/SIR/STS/SVM Guidelines for the Diagnosis and Management of Patients with Thoracic Aortic Disease. Circulation. 2010; 121: Z733-z630. Aortic aneurysm NOS (ICD10-I71.9) 4. Aortic Atherosclerosis (ICD10-I70.0). Coronary artery calcifications. Assessment for potential risk factor modification, dietary therapy or pharmacologic therapy may be warranted, if clinically indicated.  Statin:  No. Beta Blocker:  No. Aspirin :  No. ACEI:  No. ARB:  Yes.   CCB use:  No Other antiplatelets/anticoagulants:  Yes.   Eliquis  5 mg BID   ASSESSMENT/PLAN: This is a 83 y.o. female with a significant history of hypertension and atrial fibrillation on Eliquis , anxiety being admitted for chest pain.  Upon workup patient underwent CTA of the chest for PE protocol which was negative. However patient was noted to have an ascending thoracic aorta measuring 4.2 CM. There is also slightly increased hypodensity adjacent to the left lateral descending thoracic aorta (4:73). This may be possibly be a thoracic ulceration.   Vascular surgery recommends that the patient be  admitted overnight for gentle hydration. Patients BUN/ Creatinine are  20-1.21. Once hydration is completed the patient will need to undergo CTA of the chest with dissection protocol to better evaluate this area of suggested ulceration possible dissection.  Once this is completed we will evaluate for possible endovascular tag procedure.  I did have a discussion at the bedside this morning with the patient.  We discussed in detail the findings of the CT of her chest that she had as well as the need for the new CTA of her chest for dissection.  We will see the patient again after the CTA chest for dissection is completed with the plan going forward.  Patient verbalized understanding.   -I discussed the case in detail with Dr. Cordella Shawl MD and he agrees with plan.   Gwendlyn JONELLE Shank [facility] 07/07/2024 7:22 AM

## 2024-07-07 NOTE — Care Management Obs Status (Signed)
 MEDICARE OBSERVATION STATUS NOTIFICATION   Patient Details  Name: Sherry Owens MRN: [id number] Date of Birth: June 23, 1941   Medicare Observation Status Notification Given:  No (patient did not want a copy)    Sherry Owens 07/07/2024, 11:50 AM

## 2024-07-08 ENCOUNTER — Inpatient Hospital Stay

## 2024-07-08 DIAGNOSIS — R079 Chest pain, unspecified: Secondary | ICD-10-CM | POA: Diagnosis not present

## 2024-07-08 LAB — BASIC METABOLIC PANEL WITH GFR
Anion gap: 5 (ref 5–15)
BUN: 21 mg/dL (ref 8–23)
CO2: 26 mmol/L (ref 22–32)
Calcium: 9.2 mg/dL (ref 8.9–10.3)
Chloride: 108 mmol/L (ref 98–111)
Creatinine, Ser: 1.25 mg/dL — ABNORMAL HIGH (ref 0.44–1.00)
GFR, Estimated: 43 mL/min — ABNORMAL LOW (ref >60)
Glucose, Bld: 96 mg/dL (ref 70–99)
Potassium: 3.9 mmol/L (ref 3.5–5.1)
Sodium: 139 mmol/L (ref 135–145)

## 2024-07-08 LAB — CBC
HCT: 39 % (ref 36.0–46.0)
Hemoglobin: 12.6 g/dL (ref 12.0–15.0)
MCH: 31.8 pg (ref 26.0–34.0)
MCHC: 32.3 g/dL (ref 30.0–36.0)
MCV: 98.5 fL (ref 80.0–100.0)
Platelets: 143 K/uL — ABNORMAL LOW (ref 150–400)
RBC: 3.96 MIL/uL (ref 3.87–5.11)
RDW: 13.3 % (ref 11.5–15.5)
WBC: 4.8 K/uL (ref 4.0–10.5)
nRBC: 0 % (ref 0.0–0.2)

## 2024-07-08 LAB — VITAMIN B12: Vitamin B-12: 457 pg/mL (ref 180–914)

## 2024-07-08 LAB — LIPOPROTEIN A (LPA): Lipoprotein (a): 37.4 nmol/L — ABNORMAL HIGH (ref <75.0)

## 2024-07-08 LAB — PHOSPHORUS: Phosphorus: 3.6 mg/dL (ref 2.5–4.6)

## 2024-07-08 LAB — VITAMIN D 25 HYDROXY (VIT D DEFICIENCY, FRACTURES): Vit D, 25-Hydroxy: 23.02 ng/mL — ABNORMAL LOW (ref 30–100)

## 2024-07-08 LAB — MAGNESIUM: Magnesium: 2 mg/dL (ref 1.7–2.4)

## 2024-07-08 MED ORDER — VITAMIN D (ERGOCALCIFEROL) 1.25 MG (50000 UNIT) PO CAPS
50000.0000 [IU] | ORAL_CAPSULE | ORAL | Status: DC
  Filled 2024-07-08 (×2): qty 1

## 2024-07-08 MED ORDER — APIXABAN 5 MG PO TABS
5.0000 mg | ORAL_TABLET | Freq: Two times a day (BID) | ORAL | Status: DC
  Administered 2024-07-08: 5 mg via ORAL
  Filled 2024-07-08: qty 1

## 2024-07-08 MED ORDER — IOHEXOL 350 MG/ML SOLN
50.0000 mL | Freq: Once | INTRAVENOUS | Status: AC | PRN
  Administered 2024-07-08: 50 mL via INTRAVENOUS

## 2024-07-08 NOTE — Progress Notes (Signed)
 CTA pending when I rounded this morning.   Will follow up tomorrow as it is to be done today.

## 2024-07-08 NOTE — Discharge Summary (Signed)
 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 Follow-up with vascular surgery annually for ascending aortic aneurysm surveillance.  Follow up LABS/TEST:     Follow-up Information     Paraschos, Alexander, MD. Go in 1 week(s).   Specialty: Cardiology Contact information: [address] [facility] [place] [address] [phone]         Marikay Eva POUR, PA Follow up in 1 week(s).   Specialty: Physician Assistant Contact information: [phone] [facility] [address] [facility] [place] [address] [phone]         Marea Selinda RAMAN, MD Follow up in 9 month(s).   Specialties: Vascular Surgery, Radiology, Interventional Cardiology Contact information: [address] [place] [address] [phone]                Diet recommendation: Cardiac diet  Activity: The patient is advised to gradually reintroduce usual activities, as tolerated  Discharge Condition: stable  Code Status: Full code   History of present illness: As per the H and P dictated on admission Hospital Course:  83 y.o. female with medical history significant for Hypertension, atrial fibrillation on Eliquis  and anxiety being admitted for chest pain workup with concerning finding on the aorta CTA chest.  Patient developed sharp  left-sided chest pain on the day of arrival, radiating to the back and worse with inspiration.  Denies associated shortness of breath, palpitations, lightheadedness, nausea vomiting or diaphoresis.  She denies cough, fever or chills.  Has no lower extremity pain or swelling.  She was previously in her usual state of health. In the ED, vitals were unremarkable except for mild tachypnea to 24.  CBC and BMP unremarkable and troponin 7. EKG nonacute .CBC, BMP and troponin were notable only for creatinine of 1.25 which is baseline. EKG non ischemic. CTA chest negative for PE but showed some concern for periaortic fluid in the descending thoracic aorta-please see official report. The ED provider spoke with vascular surgeon, Dr. Jama who reviewed the images and opined that it appeared as artifact but would like a CTA aorta.Admission requested pending washout to perform CTA aorta study.  Vascular will consult. Patient with low intensity chest pain 1 to 2 out of 10 which was treated with Tylenol .   Assessment and Plan:   # Chest pain, most likely noncardiac etiology. Suspected periaortic fluid in the descending thoracic aorta on CTA chest ruled out on CT angio chest aorta Troponin and EKG not suggesting ACS S/p  prn Pain meds and Cardiac monitoring Vascular consult for further diagnostic evaluation of abnormality seen on descending thoracic aorta recommends  CTA of the chest with dissection protocol was done which ruled out aortic dissection and no periaortic fluid collection.  Patient was cleared by vascular surgery to discharge.   Cardiology was consulted as well, patient was cleared by cardiology to discharge and  follow-up as an outpatient for further workup.  Patient agreed with the discharge planning.    # NSVT (nonsustained ventricular tachycardia)  Following admission, patient started having 3-5 beat runs of V Tach and then had a prolonged 17 beat run of VTach  S/p IV metoprolol  ordered x1, s/p  cardiac monitoring Maintain potassium > 4 and magnesium >2, Mag 2.1 K 3.5, s/p K-Dur 40 mg x 1 dose given. S/p IVF. TTE LVEF 60-65%, no WMA, grade 1 diastolic dysfunction.  No any other significant findings. Cardiology was consulted, patient was cleared for discharge and recommended to follow-up as an outpatient.  # A-fib on Chronic anticoagulation: Resumed Eliquis  on discharge.  Continued Cardizem .     # Essential hypertension: Continue losartan  and diltiazem    # Anxiety: Continue escitalopram  and ativan   Body mass index is 28.41 kg/m.  Nutrition Interventions:  - Patient was instructed, not to drive, operate heavy machinery, perform activities at heights, swimming or participation in water activities or provide baby sitting services while on Pain, Sleep and Anxiety Medications; until her outpatient Physician has advised to do so again.  - Also recommended to not to take more than prescribed Pain, Sleep and Anxiety Medications.  Patient was ambulatory without any assistance. On the day of the discharge the patient's vitals were stable, and no other acute medical condition were reported by patient. the patient was felt safe to be discharge at Home.  Consultants: Cardiology and vascular surgery Procedures: None  Discharge Exam: General: Appear in no distress, no Rash; Oral Mucosa Clear, moist. Cardiovascular: S1 and S2 Present, no Murmur, Respiratory: normal respiratory effort, Bilateral Air entry present and no Crackles, no wheezes Abdomen: Bowel Sound present, Soft and no tenderness, no hernia Extremities: no Pedal edema, no calf tenderness Neurology: alert and oriented to time, place, and person affect appropriate.  Filed Weights   07/06/24 1716 07/06/24 2230  Weight: 85.6 kg 77.4 kg   Vitals:   07/08/24 0930 07/08/24 1242  BP: (!) 149/75 (!) 165/93  Pulse:  (!) 53  Resp:  16  Temp:  97.8 F (36.6 C)  SpO2:  98%    DISCHARGE MEDICATION: Allergies as of 07/08/2024        Reactions   Lactose Intolerance (gi) Diarrhea   Stomach upset and diarrhea        Medication List     TAKE these medications    acetaminophen  325 MG tablet Commonly known as: TYLENOL  Take 2 tablets (650 mg total) by mouth every 6 (six) hours as needed for mild pain.   alendronate  70 MG tablet Commonly known as: FOSAMAX  Take 70 mg by mouth once a week. Take with a full glass of water on an empty stomach.   apixaban  5 MG Tabs tablet Commonly known as: ELIQUIS  Take 1 tablet (5 mg total) by mouth 2 (two) times daily.   cyanocobalamin  1000 MCG tablet Commonly known as: VITAMIN B12 Take 1,000 mcg by mouth daily.   diltiazem  180 MG 24 hr capsule Commonly known as: CARDIZEM  CD Take 1 capsule (180 mg total) by mouth daily.   escitalopram  10 MG tablet Commonly known as: LEXAPRO  Take 10 mg by mouth daily.   fluticasone 50 MCG/ACT nasal spray Commonly known as: FLONASE Place into both nostrils.   LORazepam  0.5 MG tablet Commonly known as: ATIVAN  Take 0.5 mg by mouth daily at 8 pm.   losartan  100 MG tablet Commonly known as: COZAAR  Take 100 mg by mouth daily.   losartan  25 MG tablet  Commonly known as: COZAAR  Take 50 mg by mouth daily.   omeprazole 20 MG capsule Commonly known as: PRILOSEC Take 20 mg by mouth daily.   ondansetron  4 MG disintegrating tablet Commonly known as: ZOFRAN -ODT Take 1 tablet (4 mg total) by mouth every 6 (six) hours as needed for nausea or vomiting.   valACYclovir  500 MG tablet Commonly known as: VALTREX  Take 500 mg by mouth daily.   Vitamin D  (Ergocalciferol ) 1.25 MG (50000 UNIT) Caps capsule Commonly known as: DRISDOL  Take 50,000 Units by mouth once a week.       Allergies  Allergen Reactions   Lactose Intolerance (Gi) Diarrhea    Stomach upset and diarrhea   Discharge Instructions     Call MD for:  difficulty breathing, headache or visual disturbances   Complete by: As directed    Call MD for:  extreme fatigue    Complete by: As directed    Call MD for:  persistant dizziness or light-headedness   Complete by: As directed    Call MD for:  persistant nausea and vomiting   Complete by: As directed    Call MD for:  severe uncontrolled pain   Complete by: As directed    Call MD for:  temperature >100.4   Complete by: As directed    Diet - low sodium heart healthy   Complete by: As directed    Discharge instructions   Complete by: As directed    Follow-up with PCP in 1 week Follow-up with cardiology in 1 week for possible stress test as an outpatient. Follow-up with vascular surgery annually for ascending aortic aneurysm surveillance.   Increase activity slowly   Complete by: As directed        The results of significant diagnostics from this hospitalization (including imaging, microbiology, ancillary and laboratory) are listed below for reference.    Significant Diagnostic Studies: CT ANGIO CHEST AORTA W/CM &/OR WO/CM Result Date: 07/08/2024 CLINICAL DATA:  Chest pain with acute aortic syndrome suspected. EXAM: CT ANGIOGRAPHY CHEST WITH CONTRAST TECHNIQUE: Multidetector CT imaging of the chest was performed using the standard protocol during bolus administration of intravenous contrast. Multiplanar CT image reconstructions and MIPs were obtained to evaluate the vascular anatomy. RADIATION DOSE REDUCTION: This exam was performed according to the departmental dose-optimization program which includes automated exposure control, adjustment of the mA and/or kV according to patient size and/or use of iterative reconstruction technique. CONTRAST:  50mL OMNIPAQUE  IOHEXOL  350 MG/ML SOLN COMPARISON:  07/06/2024 and 10/20/2023, 01/02/2009 FINDINGS: Cardiovascular: Heart is normal size. Moderate calcified plaque over the 3 vessel coronary arteries unchanged. Ascending thoracic aorta unchanged with approximate 4 cm diameter. No evidence of aortic dissection. Descending thoracic aorta demonstrates minimal adjacent  soft tissue density which may reflect atelectasis as there is no evidence of penetrating aortic ulcer or dissection. Remaining vascular structures are unremarkable. Mediastinum/Nodes: No evidence of mediastinal or hilar adenopathy. Few small stable right pericardiophrenic lymph nodes. Remaining mediastinal structures are normal. Lungs/Pleura: Lungs are adequately inflated without acute airspace consolidation or effusion. Linear atelectasis over the lingula and left base. Airways are normal. Upper Abdomen: No acute findings. Musculoskeletal: Curvature to the thoracic spine convex right unchanged. Hemangioma over the mid to lower thoracic vertebral body unchanged Review of the MIP images confirms the above findings. IMPRESSION: 1. No acute cardiopulmonary disease. 2. Stable 4.0 cm ascending thoracic aortic aneurysm. No evidence of aortic dissection. Recommend annual imaging followup by CTA or MRA. This recommendation follows 2010 ACCF/AHA/AATS/ACR/ASA/SCA/SCAI/SIR/STS/SVM Guidelines for the Diagnosis  and Management of Patients with Thoracic Aortic Disease. Circulation. 2010; 121: Z733-z630. Aortic aneurysm NOS (ICD10-I71.9). 3. Moderate calcified plaque over the 3 vessel coronary arteries unchanged. 4. Aortic atherosclerosis. Aortic Atherosclerosis (ICD10-I70.0). Electronically Signed   By: Toribio Agreste M.D.   On: 07/08/2024 12:23   ECHOCARDIOGRAM COMPLETE Result Date: 07/07/2024    ECHOCARDIOGRAM REPORT   Patient Name:   DEMETRESS TIFT Date of Exam: 07/07/2024 Medical Rec #:  [id number]        Height:       65.0 in Accession #:    [id number]       Weight:       170.7 lb Date of Birth:  February 12, 1941         BSA:          1.849 m Patient Age:    83 years         BP:           152/81 mmHg Patient Gender: F                HR:           53 bpm. Exam Location:  [facility] Procedure: 2D Echo, Cardiac Doppler, 3D Echo and Strain Analysis (Both Spectral            and Color Flow Doppler were utilized during procedure). Indications:      Chest pain R07.9  History:         Patient has prior history of Echocardiogram examinations, most                  recent 10/06/2021. Risk Factors:Hypertension. Anxiety.  Sonographer:     Christopher Furnace Referring Phys:  [id number] DELAYNE LULLA SOLIAN Diagnosing Phys: Keller Paterson  Sonographer Comments: Global longitudinal strain was attempted. IMPRESSIONS  1. Left ventricular ejection fraction, by estimation, is 60 to 65%. The left ventricle has normal function. The left ventricle has no regional wall motion abnormalities. There is mild left ventricular hypertrophy. Left ventricular diastolic parameters are consistent with Grade I diastolic dysfunction (impaired relaxation).  2. Right ventricular systolic function is normal. The right ventricular size is normal.  3. The mitral valve is normal in structure. Mild mitral valve regurgitation.  4. The aortic valve was not well visualized. Aortic valve regurgitation is not visualized. FINDINGS  Left Ventricle: Left ventricular ejection fraction, by estimation, is 60 to 65%. The left ventricle has normal function. The left ventricle has no regional wall motion abnormalities. The left ventricular internal cavity size was normal in size. There is  mild left ventricular hypertrophy. Left ventricular diastolic parameters are consistent with Grade I diastolic dysfunction (impaired relaxation). Right Ventricle: The right ventricular size is normal. No increase in right ventricular wall thickness. Right ventricular systolic function is normal. Left Atrium: Left atrial size was normal in size. Right Atrium: Right atrial size was normal in size. Pericardium: There is no evidence of pericardial effusion. Mitral Valve: The mitral valve is normal in structure. Mild mitral valve regurgitation. MV peak gradient, 5.5 mmHg. The mean mitral valve gradient is 2.0 mmHg. Tricuspid Valve: The tricuspid valve is not well visualized. Tricuspid valve regurgitation is trivial. Aortic Valve: The aortic  valve was not well visualized. Aortic valve regurgitation is not visualized. Aortic valve mean gradient measures 2.0 mmHg. Aortic valve peak gradient measures 4.0 mmHg. Aortic valve area, by VTI measures 3.39 cm. Pulmonic Valve: The pulmonic valve was not well visualized. Pulmonic valve regurgitation is not visualized. Aorta: The ascending  aorta was not well visualized. IAS/Shunts: The atrial septum is grossly normal.  LEFT VENTRICLE PLAX 2D LVIDd:         3.70 cm   Diastology LVIDs:         2.40 cm   LV e' medial:    5.82 cm/s LV PW:         0.80 cm   LV E/e' medial:  10.4 LV IVS:        1.80 cm   LV e' lateral:   8.16 cm/s LVOT diam:     2.00 cm   LV E/e' lateral: 7.5 LV SV:         83 LV SV Index:   45 LVOT Area:     3.14 cm  RIGHT VENTRICLE RV Basal diam:  2.10 cm RV Mid diam:    2.30 cm LEFT ATRIUM           Index        RIGHT ATRIUM           Index LA diam:      3.10 cm 1.68 cm/m   RA Area:     12.30 cm LA Vol (A2C): 19.9 ml 10.76 ml/m  RA Volume:   25.70 ml  13.90 ml/m LA Vol (A4C): 38.3 ml 20.71 ml/m  AORTIC VALVE AV Area (Vmax):    3.03 cm AV Area (Vmean):   3.02 cm AV Area (VTI):     3.39 cm AV Vmax:           100.45 cm/s AV Vmean:          69.800 cm/s AV VTI:            0.246 m AV Peak Grad:      4.0 mmHg AV Mean Grad:      2.0 mmHg LVOT Vmax:         96.90 cm/s LVOT Vmean:        67.100 cm/s LVOT VTI:          0.265 m LVOT/AV VTI ratio: 1.08  AORTA Ao Root diam: 3.00 cm MITRAL VALVE               TRICUSPID VALVE MV Area (PHT): 2.43 cm    TR Peak grad:   7.3 mmHg MV Area VTI:   2.56 cm    TR Vmax:        135.00 cm/s MV Peak grad:  5.5 mmHg MV Mean grad:  2.0 mmHg    SHUNTS MV Vmax:       1.17 m/s    Systemic VTI:  0.26 m MV Vmean:      60.9 cm/s   Systemic Diam: 2.00 cm MV Decel Time: 312 msec MV E velocity: 60.80 cm/s MV A velocity: 89.60 cm/s MV E/A ratio:  0.68 Keller Paterson Electronically signed by Keller Paterson Signature Date/Time: 07/07/2024/2:07:36 PM    Final    CT Angio Chest PE W  and/or Wo Contrast Result Date: 07/06/2024 CLINICAL DATA:  Left-sided chest pain EXAM: CT ANGIOGRAPHY CHEST WITH CONTRAST TECHNIQUE: Multidetector CT imaging of the chest was performed using the standard protocol during bolus administration of intravenous contrast. Multiplanar CT image reconstructions and MIPs were obtained to evaluate the vascular anatomy. RADIATION DOSE REDUCTION: This exam was performed according to the departmental dose-optimization program which includes automated exposure control, adjustment of the mA and/or kV according to patient size and/or use of iterative reconstruction technique. CONTRAST:  70mL OMNIPAQUE  IOHEXOL  350 MG/ML SOLN  COMPARISON:  Same day chest radiograph, CT chest dated 10/20/2023 FINDINGS: Cardiovascular: The study is high quality for the evaluation of pulmonary embolism. There are no filling defects in the central, lobar, segmental or subsegmental pulmonary artery branches to suggest acute pulmonary embolism. Ascending thoracic aorta measures 4.2 x 4.0 cm (4:57). Tortuous descending thoracic aorta. Slightly increased hypodensity adjacent to the left lateral descending thoracic aorta (4:73). Normal heart size. No significant pericardial fluid/thickening. Coronary artery calcifications and aortic atherosclerosis. Mediastinum/Nodes: Imaged thyroid  gland without nodules meeting criteria for imaging follow-up by size. Normal esophagus. No pathologically enlarged axillary, supraclavicular, mediastinal, or hilar lymph nodes. Lungs/Pleura: The central airways are patent. Subsegmental left lower lobe atelectasis. Unchanged subsegmental atelectasis/consolidation in the basilar lingula. No focal consolidation. No pneumothorax. No pleural effusion. Upper abdomen: Partially imaged postsurgical changes of the ascending colon. Musculoskeletal: No acute or abnormal lytic or blastic osseous lesions. Multilevel degenerative changes of the thoracic spine. Review of the MIP images confirms  the above findings. IMPRESSION: 1. No evidence of pulmonary embolism. 2. Slightly increased hypodensity adjacent to the left lateral descending thoracic aorta, which may represent a small amount of periaortic fluid. Consider further evaluation with dedicated CTA of the chest aorta protocol given reported left chest pain. 3. Ascending thoracic aorta measures 4.2 cm. Recommend annual imaging followup by CTA or MRA. This recommendation follows 2010 ACCF/AHA/AATS/ACR/ASA/SCA/SCAI/SIR/STS/SVM Guidelines for the Diagnosis and Management of Patients with Thoracic Aortic Disease. Circulation. 2010; 121: Z733-z630. Aortic aneurysm NOS (ICD10-I71.9) 4. Aortic Atherosclerosis (ICD10-I70.0). Coronary artery calcifications. Assessment for potential risk factor modification, dietary therapy or pharmacologic therapy may be warranted, if clinically indicated. Electronically Signed   By: Limin  Xu M.D.   On: 07/06/2024 18:46   DG Chest 2 View Result Date: 07/06/2024 CLINICAL DATA:  Chest pain. EXAM: CHEST - 2 VIEW COMPARISON:  Tray shin of dated 06/21/2024. FINDINGS: No focal consolidation, pleural effusion or pneumothorax. Stable cardiac silhouette. No acute osseous pathology. IMPRESSION: No active cardiopulmonary disease. Electronically Signed   By: Vanetta Chou M.D.   On: 07/06/2024 17:35   US  Venous Img Lower Unilateral Right Result Date: 06/21/2024 CLINICAL DATA:  Calf pain EXAM: Right LOWER EXTREMITY VENOUS DOPPLER ULTRASOUND TECHNIQUE: Gray-scale sonography with compression, as well as color and duplex ultrasound, were performed to evaluate the deep venous system(s) from the level of the common femoral vein through the popliteal and proximal calf veins. COMPARISON:  Ultrasound 07/26/2023. FINDINGS: VENOUS Normal compressibility of the common femoral, superficial femoral, and popliteal veins, as well as the visualized calf veins. Visualized portions of profunda femoral vein and great saphenous vein unremarkable. No  filling defects to suggest DVT on grayscale or color Doppler imaging. Doppler waveforms show normal direction of venous flow, normal respiratory plasticity and response to augmentation. Limited views of the contralateral common femoral vein are unremarkable. OTHER None. Limitations: none IMPRESSION: No evidence of right lower extremity DVT. Electronically Signed   By: Ranell Bring M.D.   On: 06/21/2024 16:22   DG Chest 2 View Result Date: 06/21/2024 CLINICAL DATA:  Chest pain EXAM: CHEST - 2 VIEW COMPARISON:  X-ray 06/19/2023.  CT scan 10/20/2023. FINDINGS: Normal cardiopericardial silhouette. No consolidation, pneumothorax or effusion. No edema. Calcified and ectatic aorta. Curvature and degenerative changes of the spine. IMPRESSION: No acute cardiopulmonary disease. Electronically Signed   By: Ranell Bring M.D.   On: 06/21/2024 15:18    Microbiology: No results found for this or any previous visit (from the past 240 hours).   Labs: CBC: Recent Labs  Lab 07/06/24 1718 07/08/24 0438  WBC 4.6 4.8  HGB 13.6 12.6  HCT 40.5 39.0  MCV 95.5 98.5  PLT 175 143*   Basic Metabolic Panel: Recent Labs  Lab 07/06/24 1718 07/07/24 0315 07/08/24 0438  NA 139 137 139  K 3.5 4.2 3.9  CL 107 110 108  CO2 24 24 26   GLUCOSE 112* 97 96  BUN 20 25* 21  CREATININE 1.21* 1.22* 1.25*  CALCIUM 9.4 8.5* 9.2  MG 2.1 2.2 2.0  PHOS  --   --  3.6   Liver Function Tests: No results for input(s): AST, ALT, ALKPHOS, BILITOT, PROT, ALBUMIN in the last 168 hours. No results for input(s): LIPASE, AMYLASE in the last 168 hours. No results for input(s): AMMONIA in the last 168 hours. Cardiac Enzymes: No results for input(s): CKTOTAL, CKMB, CKMBINDEX, TROPONINI in the last 168 hours. BNP (last 3 results) Recent Labs    06/21/24 1354  BNP 74.4   CBG: No results for input(s): GLUCAP in the last 168 hours.  Time spent: 35 minutes  Signed:  Elvan Sor  [facility] 07/08/2024 2:33 PM

## 2024-07-08 NOTE — Progress Notes (Signed)
 Pt's family member has spoke to hospitalist prior to pt being dc'd. Pt given dc/rx instruction. Pt advised to return to any ED as needed/worse. Pt voices understanding. Pt with no valuables. Pt has dressed herself. Pt has no other clothing besides the clothes she has on. Pt awaiting her ride. No distress noted with pt.

## 2024-07-08 NOTE — Plan of Care (Signed)
  Problem: Education: Goal: Understanding of cardiac disease, CV risk reduction, and recovery process will improve Outcome: Completed/Met Goal: Individualized Educational Video(s) Outcome: Completed/Met   Problem: Activity: Goal: Ability to tolerate increased activity will improve Outcome: Completed/Met   Problem: Cardiac: Goal: Ability to achieve and maintain adequate cardiovascular perfusion will improve Outcome: Completed/Met   Problem: Health Behavior/Discharge Planning: Goal: Ability to safely manage health-related needs after discharge will improve Outcome: Completed/Met   Problem: Education: Goal: Knowledge of General Education information will improve Description: Including pain rating scale, medication(s)/side effects and non-pharmacologic comfort measures Outcome: Completed/Met   Problem: Health Behavior/Discharge Planning: Goal: Ability to manage health-related needs will improve Outcome: Completed/Met   Problem: Clinical Measurements: Goal: Ability to maintain clinical measurements within normal limits will improve Outcome: Completed/Met Goal: Will remain free from infection Outcome: Completed/Met Goal: Diagnostic test results will improve Outcome: Completed/Met Goal: Respiratory complications will improve Outcome: Completed/Met Goal: Cardiovascular complication will be avoided Outcome: Completed/Met   Problem: Activity: Goal: Risk for activity intolerance will decrease Outcome: Completed/Met   Problem: Nutrition: Goal: Adequate nutrition will be maintained Outcome: Completed/Met   Problem: Coping: Goal: Level of anxiety will decrease Outcome: Completed/Met   Problem: Elimination: Goal: Will not experience complications related to bowel motility Outcome: Completed/Met Goal: Will not experience complications related to urinary retention Outcome: Completed/Met   Problem: Pain Managment: Goal: General experience of comfort will improve and/or be  controlled Outcome: Completed/Met   Problem: Safety: Goal: Ability to remain free from injury will improve Outcome: Completed/Met   Problem: Skin Integrity: Goal: Risk for impaired skin integrity will decrease Outcome: Completed/Met

## 2024-07-08 NOTE — Progress Notes (Signed)
 Patient ID: Sherry Owens, female   DOB: Feb 03, 1941, 83 y.o.   MRN: [id number] [facility]    SUBJECTIVE: Patient feeling somewhat improved no shortness of breath no recent chest pain denies palpitations or tachycardia no lightheaded dizziness or blackout spells   Vitals:   07/07/24 2006 07/07/24 2318 07/08/24 0335 07/08/24 0819  BP: (!) 168/89 (!) 151/80 (!) 163/78 (!) 149/75  Pulse: 60 (!) 55 (!) 52 82  Resp: 18 16 17 16   Temp: 97.9 F (36.6 C) (!) 97.3 F (36.3 C) 97.9 F (36.6 C) 97.8 F (36.6 C)  TempSrc: Oral Oral Oral Oral  SpO2: 93% 100% 97% 92%  Weight:      Height:         Intake/Output Summary (Last 24 hours) at 07/08/2024 9075 Last data filed at 07/08/2024 0400 Gross per 24 hour  Intake 975.69 ml  Output 1100 ml  Net -124.31 ml      PHYSICAL EXAM  General: Well developed, well nourished, in no acute distress HEENT:  Normocephalic and atramatic Neck:  No JVD.  Lungs: Clear bilaterally to auscultation and percussion. Heart: HRRR . Normal S1 and S2 without gallops or murmurs.  Abdomen: Bowel sounds are positive, abdomen soft and non-tender  Msk:  Back normal, normal gait. Normal strength and tone for age. Extremities: No clubbing, cyanosis or edema.   Neuro: Alert and oriented X 3. Psych:  Good affect, responds appropriately   LABS: Basic Metabolic Panel: Recent Labs    07/07/24 0315 07/08/24 0438  NA 137 139  K 4.2 3.9  CL 110 108  CO2 24 26  GLUCOSE 97 96  BUN 25* 21  CREATININE 1.22* 1.25*  CALCIUM 8.5* 9.2  MG 2.2 2.0  PHOS  --  3.6   Liver Function Tests: No results for input(s): AST, ALT, ALKPHOS, BILITOT, PROT, ALBUMIN in the last 72 hours. No results for input(s): LIPASE, AMYLASE in the last 72 hours. CBC: Recent Labs    07/06/24 1718 07/08/24 0438  WBC 4.6 4.8  HGB 13.6 12.6  HCT 40.5 39.0  MCV 95.5 98.5  PLT 175 143*   Cardiac Enzymes: No results for input(s): CKTOTAL, CKMB, CKMBINDEX, TROPONINI  in the last 72 hours. BNP: Invalid input(s): POCBNP D-Dimer: No results for input(s): DDIMER in the last 72 hours. Hemoglobin A1C: No results for input(s): HGBA1C in the last 72 hours. Fasting Lipid Panel: No results for input(s): CHOL, HDL, LDLCALC, TRIG, CHOLHDL, LDLDIRECT in the last 72 hours. Thyroid  Function Tests: Recent Labs    07/06/24 1718  TSH 0.577   Anemia Panel: No results for input(s): VITAMINB12, FOLATE, FERRITIN, TIBC, IRON, RETICCTPCT in the last 72 hours.  ECHOCARDIOGRAM COMPLETE Result Date: 07/07/2024    ECHOCARDIOGRAM REPORT   Patient Name:   Sherry Owens Date of Exam: 07/07/2024 Medical Rec #:  [id number]        Height:       65.0 in Accession #:    [id number]       Weight:       170.7 lb Date of Birth:  Apr 10, 1941         BSA:          1.849 m Patient Age:    83 years         BP:           152/81 mmHg Patient Gender: F                HR:  53 bpm. Exam Location:  [facility] Procedure: 2D Echo, Cardiac Doppler, 3D Echo and Strain Analysis (Both Spectral            and Color Flow Doppler were utilized during procedure). Indications:     Chest pain R07.9  History:         Patient has prior history of Echocardiogram examinations, most                  recent 10/06/2021. Risk Factors:Hypertension. Anxiety.  Sonographer:     Christopher Furnace Referring Phys:  [id number] DELAYNE LULLA SOLIAN Diagnosing Phys: Keller Paterson  Sonographer Comments: Global longitudinal strain was attempted. IMPRESSIONS  1. Left ventricular ejection fraction, by estimation, is 60 to 65%. The left ventricle has normal function. The left ventricle has no regional wall motion abnormalities. There is mild left ventricular hypertrophy. Left ventricular diastolic parameters are consistent with Grade I diastolic dysfunction (impaired relaxation).  2. Right ventricular systolic function is normal. The right ventricular size is normal.  3. The mitral valve is normal in structure. Mild mitral valve  regurgitation.  4. The aortic valve was not well visualized. Aortic valve regurgitation is not visualized. FINDINGS  Left Ventricle: Left ventricular ejection fraction, by estimation, is 60 to 65%. The left ventricle has normal function. The left ventricle has no regional wall motion abnormalities. The left ventricular internal cavity size was normal in size. There is  mild left ventricular hypertrophy. Left ventricular diastolic parameters are consistent with Grade I diastolic dysfunction (impaired relaxation). Right Ventricle: The right ventricular size is normal. No increase in right ventricular wall thickness. Right ventricular systolic function is normal. Left Atrium: Left atrial size was normal in size. Right Atrium: Right atrial size was normal in size. Pericardium: There is no evidence of pericardial effusion. Mitral Valve: The mitral valve is normal in structure. Mild mitral valve regurgitation. MV peak gradient, 5.5 mmHg. The mean mitral valve gradient is 2.0 mmHg. Tricuspid Valve: The tricuspid valve is not well visualized. Tricuspid valve regurgitation is trivial. Aortic Valve: The aortic valve was not well visualized. Aortic valve regurgitation is not visualized. Aortic valve mean gradient measures 2.0 mmHg. Aortic valve peak gradient measures 4.0 mmHg. Aortic valve area, by VTI measures 3.39 cm. Pulmonic Valve: The pulmonic valve was not well visualized. Pulmonic valve regurgitation is not visualized. Aorta: The ascending aorta was not well visualized. IAS/Shunts: The atrial septum is grossly normal.  LEFT VENTRICLE PLAX 2D LVIDd:         3.70 cm   Diastology LVIDs:         2.40 cm   LV e' medial:    5.82 cm/s LV PW:         0.80 cm   LV E/e' medial:  10.4 LV IVS:        1.80 cm   LV e' lateral:   8.16 cm/s LVOT diam:     2.00 cm   LV E/e' lateral: 7.5 LV SV:         83 LV SV Index:   45 LVOT Area:     3.14 cm  RIGHT VENTRICLE RV Basal diam:  2.10 cm RV Mid diam:    2.30 cm LEFT ATRIUM            Index        RIGHT ATRIUM           Index LA diam:      3.10 cm 1.68 cm/m   RA Area:  12.30 cm LA Vol (A2C): 19.9 ml 10.76 ml/m  RA Volume:   25.70 ml  13.90 ml/m LA Vol (A4C): 38.3 ml 20.71 ml/m  AORTIC VALVE AV Area (Vmax):    3.03 cm AV Area (Vmean):   3.02 cm AV Area (VTI):     3.39 cm AV Vmax:           100.45 cm/s AV Vmean:          69.800 cm/s AV VTI:            0.246 m AV Peak Grad:      4.0 mmHg AV Mean Grad:      2.0 mmHg LVOT Vmax:         96.90 cm/s LVOT Vmean:        67.100 cm/s LVOT VTI:          0.265 m LVOT/AV VTI ratio: 1.08  AORTA Ao Root diam: 3.00 cm MITRAL VALVE               TRICUSPID VALVE MV Area (PHT): 2.43 cm    TR Peak grad:   7.3 mmHg MV Area VTI:   2.56 cm    TR Vmax:        135.00 cm/s MV Peak grad:  5.5 mmHg MV Mean grad:  2.0 mmHg    SHUNTS MV Vmax:       1.17 m/s    Systemic VTI:  0.26 m MV Vmean:      60.9 cm/s   Systemic Diam: 2.00 cm MV Decel Time: 312 msec MV E velocity: 60.80 cm/s MV A velocity: 89.60 cm/s MV E/A ratio:  0.68 Keller Paterson Electronically signed by Keller Paterson Signature Date/Time: 07/07/2024/2:07:36 PM    Final    CT Angio Chest PE W and/or Wo Contrast Result Date: 07/06/2024 CLINICAL DATA:  Left-sided chest pain EXAM: CT ANGIOGRAPHY CHEST WITH CONTRAST TECHNIQUE: Multidetector CT imaging of the chest was performed using the standard protocol during bolus administration of intravenous contrast. Multiplanar CT image reconstructions and MIPs were obtained to evaluate the vascular anatomy. RADIATION DOSE REDUCTION: This exam was performed according to the departmental dose-optimization program which includes automated exposure control, adjustment of the mA and/or kV according to patient size and/or use of iterative reconstruction technique. CONTRAST:  70mL OMNIPAQUE  IOHEXOL  350 MG/ML SOLN COMPARISON:  Same day chest radiograph, CT chest dated 10/20/2023 FINDINGS: Cardiovascular: The study is high quality for the evaluation of pulmonary embolism.  There are no filling defects in the central, lobar, segmental or subsegmental pulmonary artery branches to suggest acute pulmonary embolism. Ascending thoracic aorta measures 4.2 x 4.0 cm (4:57). Tortuous descending thoracic aorta. Slightly increased hypodensity adjacent to the left lateral descending thoracic aorta (4:73). Normal heart size. No significant pericardial fluid/thickening. Coronary artery calcifications and aortic atherosclerosis. Mediastinum/Nodes: Imaged thyroid  gland without nodules meeting criteria for imaging follow-up by size. Normal esophagus. No pathologically enlarged axillary, supraclavicular, mediastinal, or hilar lymph nodes. Lungs/Pleura: The central airways are patent. Subsegmental left lower lobe atelectasis. Unchanged subsegmental atelectasis/consolidation in the basilar lingula. No focal consolidation. No pneumothorax. No pleural effusion. Upper abdomen: Partially imaged postsurgical changes of the ascending colon. Musculoskeletal: No acute or abnormal lytic or blastic osseous lesions. Multilevel degenerative changes of the thoracic spine. Review of the MIP images confirms the above findings. IMPRESSION: 1. No evidence of pulmonary embolism. 2. Slightly increased hypodensity adjacent to the left lateral descending thoracic aorta, which may represent a small amount of periaortic fluid. Consider further evaluation  with dedicated CTA of the chest aorta protocol given reported left chest pain. 3. Ascending thoracic aorta measures 4.2 cm. Recommend annual imaging followup by CTA or MRA. This recommendation follows 2010 ACCF/AHA/AATS/ACR/ASA/SCA/SCAI/SIR/STS/SVM Guidelines for the Diagnosis and Management of Patients with Thoracic Aortic Disease. Circulation. 2010; 121: Z733-z630. Aortic aneurysm NOS (ICD10-I71.9) 4. Aortic Atherosclerosis (ICD10-I70.0). Coronary artery calcifications. Assessment for potential risk factor modification, dietary therapy or pharmacologic therapy may be  warranted, if clinically indicated. Electronically Signed   By: Limin  Xu M.D.   On: 07/06/2024 18:46   DG Chest 2 View Result Date: 07/06/2024 CLINICAL DATA:  Chest pain. EXAM: CHEST - 2 VIEW COMPARISON:  Tray shin of dated 06/21/2024. FINDINGS: No focal consolidation, pleural effusion or pneumothorax. Stable cardiac silhouette. No acute osseous pathology. IMPRESSION: No active cardiopulmonary disease. Electronically Signed   By: Vanetta Chou M.D.   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/2024   NSVT (nonsustained ventricular tachycardia) (HCC)   TIA (transient ischemic attack)    Plan Agree with admit follow-up EKGs troponins telemetry Atrial fibrillation paroxysmal continue rate control and anticoagulation Reported history of nonsustained VT unable to identify on monitoring but nonsustained and asymptomatic recommend conservative therapy possibly with beta-blockers Hypertension maintain aggressive hypertension management control goal is less than 130/80 on losartan  Cardizem  Chronic renal sufficiency stage IIIb continue conservative management consider follow-up with nephrology     Cara JONETTA Lovelace, MD 07/08/2024 9:24 AM

## 2024-07-08 NOTE — Plan of Care (Signed)

## 2024-12-01 ENCOUNTER — Emergency Department

## 2024-12-01 ENCOUNTER — Emergency Department
Admission: EM | Admit: 2024-12-01 | Discharge: 2024-12-01 | Disposition: A | Discharge status: Home / Self Care | Attending: Emergency Medicine | Admitting: Emergency Medicine

## 2024-12-01 ENCOUNTER — Other Ambulatory Visit: Payer: Self-pay

## 2024-12-01 DIAGNOSIS — R42 Dizziness and giddiness: Secondary | ICD-10-CM

## 2024-12-01 DIAGNOSIS — I10 Essential (primary) hypertension: Secondary | ICD-10-CM | POA: Diagnosis not present

## 2024-12-01 DIAGNOSIS — Z96653 Presence of artificial knee joint, bilateral: Secondary | ICD-10-CM | POA: Diagnosis not present

## 2024-12-01 DIAGNOSIS — Z85038 Personal history of other malignant neoplasm of large intestine: Secondary | ICD-10-CM | POA: Insufficient documentation

## 2024-12-01 DIAGNOSIS — Z79899 Other long term (current) drug therapy: Secondary | ICD-10-CM | POA: Diagnosis not present

## 2024-12-01 DIAGNOSIS — Z7901 Long term (current) use of anticoagulants: Secondary | ICD-10-CM | POA: Diagnosis not present

## 2024-12-01 LAB — URINALYSIS, ROUTINE W REFLEX MICROSCOPIC
Bacteria, UA: NONE SEEN
Bilirubin Urine: NEGATIVE
Glucose, UA: NEGATIVE mg/dL
Hgb urine dipstick: NEGATIVE
Ketones, ur: NEGATIVE mg/dL
Leukocytes,Ua: NEGATIVE
Nitrite: NEGATIVE
Protein, ur: 30 mg/dL — AB
Specific Gravity, Urine: 1.008 (ref 1.005–1.030)
Squamous Epithelial / HPF: 0 /HPF (ref 0–5)
pH: 7 (ref 5.0–8.0)

## 2024-12-01 LAB — CBC WITH DIFFERENTIAL/PLATELET
Abs Immature Granulocytes: 0.01 K/uL (ref 0.00–0.07)
Basophils Absolute: 0 K/uL (ref 0.0–0.1)
Basophils Relative: 1 %
Eosinophils Absolute: 0 K/uL (ref 0.0–0.5)
Eosinophils Relative: 1 %
HCT: 39.2 % (ref 36.0–46.0)
Hemoglobin: 13.2 g/dL (ref 12.0–15.0)
Immature Granulocytes: 0 %
Lymphocytes Relative: 31 %
Lymphs Abs: 1.3 K/uL (ref 0.7–4.0)
MCH: 32.4 pg (ref 26.0–34.0)
MCHC: 33.7 g/dL (ref 30.0–36.0)
MCV: 96.1 fL (ref 80.0–100.0)
Monocytes Absolute: 0.4 K/uL (ref 0.1–1.0)
Monocytes Relative: 9 %
Neutro Abs: 2.4 K/uL (ref 1.7–7.7)
Neutrophils Relative %: 58 %
Platelets: 154 K/uL (ref 150–400)
RBC: 4.08 MIL/uL (ref 3.87–5.11)
RDW: 13.2 % (ref 11.5–15.5)
WBC: 4.2 K/uL (ref 4.0–10.5)
nRBC: 0 % (ref 0.0–0.2)

## 2024-12-01 LAB — BASIC METABOLIC PANEL WITH GFR
Anion gap: 8 (ref 5–15)
BUN: 20 mg/dL (ref 8–23)
CO2: 26 mmol/L (ref 22–32)
Calcium: 9.2 mg/dL (ref 8.9–10.3)
Chloride: 104 mmol/L (ref 98–111)
Creatinine, Ser: 1.16 mg/dL — ABNORMAL HIGH (ref 0.44–1.00)
GFR, Estimated: 47 mL/min — ABNORMAL LOW
Glucose, Bld: 100 mg/dL — ABNORMAL HIGH (ref 70–99)
Potassium: 3.9 mmol/L (ref 3.5–5.1)
Sodium: 139 mmol/L (ref 135–145)

## 2024-12-01 MED ORDER — MECLIZINE HCL 25 MG PO TABS: 25.0000 mg | ORAL_TABLET | Freq: Three times a day (TID) | ORAL | 0 refills | Status: AC | PRN

## 2024-12-01 MED ORDER — MECLIZINE HCL 25 MG PO TABS
25.0000 mg | ORAL_TABLET | Freq: Once | ORAL | Status: AC
  Administered 2024-12-01: 25 mg via ORAL
  Filled 2024-12-01: qty 1

## 2024-12-01 MED ORDER — SODIUM CHLORIDE 0.9 % IV BOLUS (SEPSIS)
500.0000 mL | Freq: Once | INTRAVENOUS | Status: AC
  Administered 2024-12-01: 500 mL via INTRAVENOUS

## 2024-12-01 MED ORDER — ONDANSETRON 4 MG PO TBDP: 4.0000 mg | ORAL_TABLET | Freq: Three times a day (TID) | ORAL | 0 refills | Status: AC | PRN

## 2024-12-01 MED ORDER — ONDANSETRON HCL 4 MG/2ML IJ SOLN
4.0000 mg | Freq: Once | INTRAMUSCULAR | Status: AC
  Administered 2024-12-01: 4 mg via INTRAVENOUS
  Filled 2024-12-01: qty 2

## 2024-12-01 NOTE — ED Provider Notes (Signed)
 "  [facility] Provider Note    Event Date/Time   First MD Initiated Contact with Patient 12/01/24 [phone]     (approximate)   History   Hypertension   HPI  Sherry Owens is a 83 y.o. female with history of rheumatoid arthritis, hypertension, atrial fibrillation on Eliquis , TIA who presents to the emergency department with sudden onset vertigo.  States she was awake and at 4 AM developed spinning sensation and nausea.  States she was not able to walk.  States she has had vertigo many years before but has never been this severe.  No headache or head injury.  No numbness, tingling or weakness.  No chest pain or shortness of breath.  No recent vomiting or diarrhea.  No urinary symptoms.  No tinnitus, ear pain or hearing loss.  No prior history of stroke.   History provided by patient.    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   Collagen vascular disease    RA   Depression    Genital herpes simplex    GERD (gastroesophageal reflux disease)    Hypertension    Osteoporosis     Past Surgical History:  Procedure Laterality Date   ABDOMINAL HYSTERECTOMY     COLONOSCOPY     COLONOSCOPY WITH PROPOFOL  N/A 05/04/2018   Procedure: COLONOSCOPY WITH PROPOFOL ;  Surgeon: Toledo, Ladell POUR, MD;  Location: [facility] ENDOSCOPY;  Service: Gastroenterology;  Laterality: N/A;   ESOPHAGOGASTRODUODENOSCOPY (EGD) WITH PROPOFOL  N/A 05/04/2018   Procedure: ESOPHAGOGASTRODUODENOSCOPY (EGD) WITH PROPOFOL ;  Surgeon: Toledo, Ladell POUR, MD;  Location: [facility] ENDOSCOPY;  Service: Gastroenterology;  Laterality: N/A;   JOINT REPLACEMENT Bilateral    bilateral total knees: R-2016, L-2017   LAPAROSCOPIC RIGHT HEMI COLECTOMY Right 09/07/2018   Procedure: LAPAROSCOPIC RIGHT HEMI COLECTOMY;  Surgeon: Tye Millet, DO;  Location: [facility] ORS;  Service: General;  Laterality:  Right;   OOPHORECTOMY     TUBAL LIGATION      MEDICATIONS:  Prior to Admission medications  Medication Sig Start Date End Date Taking? Authorizing Provider  acetaminophen  (TYLENOL ) 325 MG tablet Take 2 tablets (650 mg total) by mouth every 6 (six) hours as needed for mild pain. 09/12/18   Sakai, Isami, DO  alendronate  (FOSAMAX ) 70 MG tablet Take 70 mg by mouth once a week. Take with a full glass of water on an empty stomach.    [provider]  apixaban  (ELIQUIS ) 5 MG TABS tablet Take 1 tablet (5 mg total) by mouth 2 (two) times daily. 10/04/21   Josette Ade, MD  diltiazem  (CARDIZEM  CD) 180 MG 24 hr capsule Take 1 capsule (180 mg total) by mouth daily. 10/04/21   Josette Ade, MD  escitalopram  (LEXAPRO ) 10 MG tablet Take 10 mg by mouth daily.    [provider]  fluticasone (FLONASE) 50 MCG/ACT nasal spray Place into both nostrils.    [provider]  LORazepam  (ATIVAN ) 0.5 MG tablet Take 0.5 mg by mouth daily at 8 pm.     [provider]  losartan  (COZAAR ) 100 MG tablet Take 100 mg by mouth daily.    [provider]  losartan  (COZAAR ) 25 MG tablet Take 50 mg by mouth daily. 08/20/21   [provider]  omeprazole (PRILOSEC) 20 MG capsule Take 20 mg by mouth daily.  [provider]  ondansetron  (ZOFRAN -ODT) 4 MG disintegrating tablet Take 1 tablet (4 mg total) by mouth every 6 (six) hours as needed for nausea or vomiting. 01/24/22   Deanta Mincey, Josette SAILOR, DO  valACYclovir  (VALTREX ) 500 MG tablet Take 500 mg by mouth daily.     [provider]  vitamin B-12 (CYANOCOBALAMIN ) 1000 MCG tablet Take 1,000 mcg by mouth daily.    [provider]  Vitamin D , Ergocalciferol , (DRISDOL ) 1.25 MG (50000 UNIT) CAPS capsule Take 50,000 Units by mouth once a week. 09/24/21   [provider]    Physical Exam   Triage Vital Signs: ED Triage Vitals  Encounter Vitals Group     BP 12/01/24 0436 (!) 204/85     Girls  Systolic BP Percentile --      Girls Diastolic BP Percentile --      Boys Systolic BP Percentile --      Boys Diastolic BP Percentile --      Pulse Rate 12/01/24 0436 (!) 54     Resp 12/01/24 0436 17     Temp 12/01/24 0436 97.7 F (36.5 C)     Temp src --      SpO2 12/01/24 0436 99 %     Weight 12/01/24 0434 165 lb (74.8 kg)     Height 12/01/24 0434 5' 5 (1.651 m)     Head Circumference --      Peak Flow --      Pain Score 12/01/24 0434 0     Pain Loc --      Pain Education --      Exclude from Growth Chart --     Most recent vital signs: Vitals:   12/01/24 0436 12/01/24 0600  BP: (!) 204/85 (!) 197/86  Pulse: (!) 54 (!) 52  Resp: 17 14  Temp: 97.7 F (36.5 C)   SpO2: 99% 97%    CONSTITUTIONAL: Alert, responds appropriately to questions. Well-appearing; well-nourished HEAD: Normocephalic, atraumatic EYES: Conjunctivae clear, pupils appear equal, sclera nonicteric ENT: normal nose; moist mucous membranes NECK: Supple, normal ROM CARD: RRR; S1 and S2 appreciated RESP: Normal chest excursion without splinting or tachypnea; breath sounds clear and equal bilaterally; no wheezes, no rhonchi, no rales, no hypoxia or respiratory distress, speaking full sentences ABD/GI: Non-distended; soft, non-tender, no rebound, no guarding, no peritoneal signs BACK: The back appears normal EXT: Normal ROM in all joints; no deformity noted, no edema SKIN: Normal color for age and race; warm; no rash on exposed skin NEURO: Moves all extremities equally, normal speech, normal sensation, no facial asymmetry, gait testing deferred PSYCH: The patient's mood and manner are appropriate.   ED Results / Procedures / Treatments   LABS: (all labs ordered are listed, but only abnormal results are displayed) Labs Reviewed  BASIC METABOLIC PANEL WITH GFR - Abnormal; Notable for the following components:      Result Value   Glucose, Bld 100 (*)    Creatinine, Ser 1.16 (*)    GFR, Estimated 47 (*)     All other components within normal limits  CBC WITH DIFFERENTIAL/PLATELET  URINALYSIS, ROUTINE W REFLEX MICROSCOPIC     EKG:  EKG Interpretation Date/Time:  Friday December 01 2024 04:34:57 EST Ventricular Rate:  54 PR Interval:  147 QRS Duration:  84 QT Interval:  461 QTC Calculation: 437 R Axis:   -12  Text Interpretation: Sinus rhythm Abnormal R-wave progression, early transition Left ventricular hypertrophy Confirmed by Neomi Josette [phone]) on 12/01/2024 5:14:32 AM  RADIOLOGY: My personal review and interpretation of imaging: MRI brain pending  I have personally reviewed all radiology reports.   No results found.   PROCEDURES:  Critical Care performed: No   CRITICAL CARE Performed by: Josette Sink   Total critical care time: 0 minutes  Critical care time was exclusive of separately billable procedures and treating other patients.  Critical care was necessary to treat or prevent imminent or life-threatening deterioration.  Critical care was time spent personally by me on the following activities: development of treatment plan with patient and/or surrogate as well as nursing, discussions with consultants, evaluation of patient's response to treatment, examination of patient, obtaining history from patient or surrogate, ordering and performing treatments and interventions, ordering and review of laboratory studies, ordering and review of radiographic studies, pulse oximetry and re-evaluation of patient's condition.   SABRA1-3 Lead EKG Interpretation  Performed by: Sequoyah Ramone, Josette SAILOR, DO Authorized by: Freemon Binford, Josette SAILOR, DO     Interpretation: abnormal     ECG rate:  52   ECG rate assessment: normal     Rhythm: sinus rhythm     Ectopy: none     Conduction: normal       IMPRESSION / MDM / ASSESSMENT AND PLAN / ED COURSE  I reviewed the triage vital signs and the nursing notes.    Patient here with sudden onset vertigo.  History of hypertension,  atrial fibrillation, TIA.  No headache or other focal neurodeficits.  The patient is on the cardiac monitor to evaluate for evidence of arrhythmia and/or significant heart rate changes.   DIFFERENTIAL DIAGNOSIS (includes but not limited to):   Stroke, BPPV, TIA, labyrinthitis, Mnire's, anemia, electrolyte derangement, UTI, dehydration   Patient's presentation is most consistent with acute presentation with potential threat to life or bodily function.   PLAN: Will obtain labs, urine, MRI brain as she certainly has risk factors for stroke.  Last known well 4 AM but NIH stroke scale currently 0.  No indication for tPA.  Will give IV fluids, meclizine  and Zofran .  Patient is hypertensive here but given possible concern for stroke will allow for permissive hypertension.  She has no headache, vision changes, numbness or weakness.  Low suspicion at this time for intracranial hemorrhage.   MEDICATIONS GIVEN IN ED: Medications  sodium chloride  0.9 % bolus 500 mL (500 mLs Intravenous New Bag/Given 12/01/24 0551)  meclizine  (ANTIVERT ) tablet 25 mg (25 mg Oral Given 12/01/24 0550)  ondansetron  (ZOFRAN ) injection 4 mg (4 mg Intravenous Given 12/01/24 0550)     ED COURSE: Labs show normal hemoglobin.  Mild chronic kidney disease which is stable.  EKG nonischemic without arrhythmia.  Urine and MRI brain pending.  Patient will be signed out the oncoming ED physician at 7 AM.   CONSULTS: Pending further evaluation   OUTSIDE RECORDS REVIEWED: Reviewed last admission in July 2025.       FINAL CLINICAL IMPRESSION(S) / ED DIAGNOSES   Final diagnoses:  Vertigo  Hypertension, unspecified type     Rx / DC Orders   ED Discharge Orders     None        Note:  This document was prepared using Dragon voice recognition software and may include unintentional dictation errors.   Antone Summons, Josette SAILOR, DO 12/01/24 [phone]  "

## 2024-12-01 NOTE — ED Notes (Signed)
 Passed swallow screen

## 2024-12-01 NOTE — ED Provider Notes (Signed)
 Emergency department handoff note  Care of this patient was signed out to me at the end of the previous provider shift.  All pertinent patient information was conveyed and all questions were answered.  Patient pending MRI of the brain does not show any evidence of acute abnormalities.  Patient's symptoms greatly resolved after meclizine  and Zofran  administration.  Patient agrees with plan for discharge home at this time with outpatient PCP follow-up as needed.  Patient is p.o. tolerant and ambulatory without difficulty  Dispo: Discharge home with PCP follow-up as needed   Jennaya Pogue K, MD 12/01/24 [phone]

## 2024-12-01 NOTE — ED Triage Notes (Signed)
 Pt BIB [facility] from home. EMS found bp of 200/100. Pt c/o room spinning, feelikng like she's going to pass out, dizziness per EMS. Pt states she's had a cold x's a few days. CBG=107, 4 Zofran  PTA.

## 2024-12-01 NOTE — ED Notes (Signed)
 This tech and tech Kyen assisted pt to ambulate to the restroom at this time.
